# Patient Record
Sex: Male | Born: 1945 | Race: White | Hispanic: No | Smoking: Never smoker
Health system: Southern US, Community
[De-identification: ages and names within clinical notes are randomized; demographics above are authoritative.]

## PROBLEM LIST (undated history)

## (undated) ENCOUNTER — Emergency Department (HOSPITAL_COMMUNITY): Admission: EM | Payer: Medicare Other | Source: Home / Self Care

## (undated) DIAGNOSIS — H3320 Serous retinal detachment, unspecified eye: Secondary | ICD-10-CM

## (undated) DIAGNOSIS — D649 Anemia, unspecified: Secondary | ICD-10-CM

## (undated) DIAGNOSIS — G4730 Sleep apnea, unspecified: Secondary | ICD-10-CM

## (undated) DIAGNOSIS — K589 Irritable bowel syndrome without diarrhea: Secondary | ICD-10-CM

## (undated) DIAGNOSIS — G453 Amaurosis fugax: Secondary | ICD-10-CM

## (undated) DIAGNOSIS — N200 Calculus of kidney: Secondary | ICD-10-CM

## (undated) DIAGNOSIS — N400 Enlarged prostate without lower urinary tract symptoms: Secondary | ICD-10-CM

## (undated) DIAGNOSIS — H348122 Central retinal vein occlusion, left eye, stable: Secondary | ICD-10-CM

## (undated) DIAGNOSIS — E7800 Pure hypercholesterolemia, unspecified: Secondary | ICD-10-CM

## (undated) DIAGNOSIS — A048 Other specified bacterial intestinal infections: Secondary | ICD-10-CM

## (undated) DIAGNOSIS — G43909 Migraine, unspecified, not intractable, without status migrainosus: Secondary | ICD-10-CM

## (undated) DIAGNOSIS — H35342 Macular cyst, hole, or pseudohole, left eye: Secondary | ICD-10-CM

## (undated) DIAGNOSIS — I10 Essential (primary) hypertension: Secondary | ICD-10-CM

## (undated) HISTORY — DX: Other specified bacterial intestinal infections: A04.8

## (undated) HISTORY — DX: Serous retinal detachment, unspecified eye: H33.20

## (undated) HISTORY — DX: Benign prostatic hyperplasia without lower urinary tract symptoms: N40.0

## (undated) HISTORY — DX: Macular cyst, hole, or pseudohole, left eye: H35.342

## (undated) HISTORY — DX: Migraine, unspecified, not intractable, without status migrainosus: G43.909

## (undated) HISTORY — PX: LITHOTRIPSY: SUR834

## (undated) HISTORY — DX: Sleep apnea, unspecified: G47.30

## (undated) HISTORY — DX: Calculus of kidney: N20.0

## (undated) HISTORY — PX: RETINAL DETACHMENT SURGERY: SHX105

## (undated) HISTORY — DX: Central retinal vein occlusion, left eye, stable: H34.8122

## (undated) HISTORY — PX: HERNIA REPAIR: SHX51

## (undated) HISTORY — DX: Irritable bowel syndrome, unspecified: K58.9

## (undated) HISTORY — PX: KIDNEY STONE SURGERY: SHX686

## (undated) HISTORY — DX: Amaurosis fugax: G45.3

---

## 2003-08-23 DIAGNOSIS — H3320 Serous retinal detachment, unspecified eye: Secondary | ICD-10-CM

## 2003-08-23 DIAGNOSIS — H35342 Macular cyst, hole, or pseudohole, left eye: Secondary | ICD-10-CM

## 2003-08-23 HISTORY — DX: Serous retinal detachment, unspecified eye: H33.20

## 2003-08-23 HISTORY — DX: Macular cyst, hole, or pseudohole, left eye: H35.342

## 2011-06-18 ENCOUNTER — Emergency Department (HOSPITAL_BASED_OUTPATIENT_CLINIC_OR_DEPARTMENT_OTHER)
Admission: EM | Admit: 2011-06-18 | Discharge: 2011-06-19 | Disposition: A | Discharge status: Home / Self Care | Payer: BC Managed Care – PPO | Attending: Emergency Medicine | Admitting: Emergency Medicine

## 2011-06-18 ENCOUNTER — Encounter: Payer: Self-pay | Admitting: *Deleted

## 2011-06-18 DIAGNOSIS — E7800 Pure hypercholesterolemia, unspecified: Secondary | ICD-10-CM | POA: Insufficient documentation

## 2011-06-18 DIAGNOSIS — J40 Bronchitis, not specified as acute or chronic: Secondary | ICD-10-CM

## 2011-06-18 DIAGNOSIS — R0602 Shortness of breath: Secondary | ICD-10-CM | POA: Insufficient documentation

## 2011-06-18 DIAGNOSIS — J069 Acute upper respiratory infection, unspecified: Secondary | ICD-10-CM | POA: Insufficient documentation

## 2011-06-18 HISTORY — DX: Benign prostatic hyperplasia without lower urinary tract symptoms: N40.0

## 2011-06-18 HISTORY — DX: Pure hypercholesterolemia, unspecified: E78.00

## 2011-06-18 HISTORY — DX: Calculus of kidney: N20.0

## 2011-06-18 NOTE — ED Notes (Signed)
Pt reports cold since Tuesday- had hiccups today- states he "couldn't breathe" when trying to lie flat- started zpack tonight

## 2011-06-19 ENCOUNTER — Other Ambulatory Visit: Payer: Self-pay

## 2011-06-19 ENCOUNTER — Emergency Department (INDEPENDENT_AMBULATORY_CARE_PROVIDER_SITE_OTHER): Payer: BC Managed Care – PPO

## 2011-06-19 DIAGNOSIS — R0602 Shortness of breath: Secondary | ICD-10-CM

## 2011-06-19 DIAGNOSIS — R0989 Other specified symptoms and signs involving the circulatory and respiratory systems: Secondary | ICD-10-CM

## 2011-06-19 LAB — RAPID STREP SCREEN (MED CTR MEBANE ONLY): Streptococcus, Group A Screen (Direct): NEGATIVE

## 2011-06-19 MED ORDER — ALBUTEROL SULFATE HFA 108 (90 BASE) MCG/ACT IN AERS
2.0000 | INHALATION_SPRAY | Freq: Four times a day (QID) | RESPIRATORY_TRACT | Status: DC
  Administered 2011-06-19 (×2): 2 via RESPIRATORY_TRACT
  Filled 2011-06-19: qty 6.7

## 2011-06-19 NOTE — ED Provider Notes (Signed)
History     CSN: [id number] Arrival date & time: 06/18/2011 11:43 PM   First MD Initiated Contact with Patient 06/19/11 0115      Chief Complaint  Patient presents with  . URI  . Shortness of Breath    (Consider location/radiation/quality/duration/timing/severity/associated sxs/prior treatment) Patient is a 65 y.o. male presenting with cough.  Cough This is a new problem. The current episode started more than 2 days ago. The problem occurs every few minutes. The cough is productive of sputum. There has been no fever. Associated symptoms include rhinorrhea, sore throat and shortness of breath. Pertinent negatives include no chest pain, no chills, no sweats, no ear congestion, no headaches and no wheezing. Associated symptoms comments: Pt got SOB on laying down tonight which is why he came in, feels better now. He is not a smoker. His past medical history does not include COPD or asthma.    Past Medical History  Diagnosis Date  . Kidney stones   . High cholesterol   . BPH (benign prostatic hyperplasia)     Past Surgical History  Procedure Date  . Retinal detachment surgery   . Hernia repair   . Kidney stone surgery   . Lithotripsy     History reviewed. No pertinent family history.  History  Substance Use Topics  . Smoking status: Never Smoker   . Smokeless tobacco: Not on file  . Alcohol Use: Yes     rare      Review of Systems  Constitutional: Negative for fever, chills, diaphoresis and fatigue.  HENT: Positive for congestion, sore throat and rhinorrhea. Negative for sneezing.   Eyes: Negative.   Respiratory: Positive for cough and shortness of breath. Negative for chest tightness and wheezing.   Cardiovascular: Negative for chest pain and leg swelling.  Gastrointestinal: Negative for nausea, vomiting, abdominal pain, diarrhea and blood in stool.  Genitourinary: Negative for frequency, hematuria, flank pain and difficulty urinating.  Musculoskeletal: Negative  for back pain and arthralgias.  Skin: Negative for rash.  Neurological: Negative for dizziness, speech difficulty, weakness, numbness and headaches.    Allergies  Codeine  Home Medications   Current Outpatient Rx  Name Route Sig Dispense Refill  . AZITHROMYCIN 500 MG PO TABS Oral Take 500 mg by mouth daily.      Marland Kitchen DEXTROMETHORPHAN-GUAIFENESIN 30-600 MG PO TB12 Oral Take 1 tablet by mouth every 12 (twelve) hours.      Marland Kitchen CARDURA PO Oral Take by mouth.      . TOVIAZ PO Oral Take by mouth.      . FOLIC ACID PO Oral Take by mouth daily.      Marland Kitchen LORATADINE-PSEUDOEPHEDRINE 10-240 MG PO TB24 Oral Take 1 tablet by mouth daily.      Marland Kitchen ROSUVASTATIN CALCIUM 40 MG PO TABS Oral Take 40 mg by mouth daily.        BP 144/88  Pulse 92  Temp(Src) 98 F (36.7 C) (Oral)  Resp 19  Ht 5\' 9"  (1.753 m)  Wt 179 lb (81.194 kg)  BMI 26.43 kg/m2  SpO2 96%  Physical Exam  Constitutional: He is oriented to person, place, and time. He appears well-developed and well-nourished.  HENT:  Head: Normocephalic and atraumatic.  Eyes: Pupils are equal, round, and reactive to light.  Neck: Normal range of motion. Neck supple.  Cardiovascular: Normal rate, regular rhythm and normal heart sounds.   Pulmonary/Chest: Effort normal. No respiratory distress. He has no wheezes. He has rhonchi. He has no rales.  He exhibits no tenderness.  Abdominal: Soft. Bowel sounds are normal. There is no tenderness. There is no rebound and no guarding.  Musculoskeletal: Normal range of motion. He exhibits no edema and no tenderness.  Lymphadenopathy:    He has no cervical adenopathy.  Neurological: He is alert and oriented to person, place, and time.  Skin: Skin is warm and dry. No rash noted.  Psychiatric: He has a normal mood and affect.    ED Course  Procedures (including critical care time)  Results for orders placed during the hospital encounter of 06/18/11  RAPID STREP SCREEN      Component Value Range   Streptococcus,  Group A Screen (Direct) NEGATIVE  NEGATIVE    Dg Chest 2 View  06/19/2011  *RADIOLOGY REPORT*  Clinical Data: Congestion and shortness of breath  CHEST - 2 VIEW  Comparison: None.  Findings: Slightly shallow inspiration with elevation of the anterior right hemidiaphragm.  Normal heart size and pulmonary vascularity.  Suggestion of mild fibrosis or atelectasis in the lung bases.  No focal airspace consolidation.  No blunting of costophrenic angles.  No pneumothorax.  Calcified and tortuous aorta.  Degenerative changes in the thoracic spine.  IMPRESSION: Shallow inspiration with mild atelectasis or fibrosis in the lung bases.  No focal airspace consolidation.  Original Report Authenticated By: Marlon Pel, M.D.    Date: 06/19/2011  Rate: 98  Rhythm: normal sinus rhythm  QRS Axis: normal  Intervals: normal  ST/T Wave abnormalities: normal  Conduction Disutrbances:incomplete RBBB  Narrative Interpretation:   Old EKG Reviewed: none available     1. Bronchitis       MDM  No evidence of pneumonia/CHF/strept pharyngitis.  Likely bronchitis, no wheezing, good sats. Pt started z-pack tonight, also has cough med he is taking prescribed by his PMD.  EKG okay.  Pt feeling better.  Was feeling wheezy earlier.  Will try MDI        Rolan Bucco, MD 06/19/11 [phone]

## 2012-11-12 ENCOUNTER — Other Ambulatory Visit: Payer: Self-pay | Admitting: *Deleted

## 2012-11-12 NOTE — Telephone Encounter (Signed)
Call from patient in reference to the Amitriptyline 25 mg, he would like to know could he take the generic equivalent to this medication? Is it better or could he try something else?

## 2012-11-12 NOTE — Telephone Encounter (Signed)
Called pt. Told him its ok to take generic. He was also told to try "sleepy time" herbal tea if he was having sleeping trouble.  PG

## 2013-01-15 ENCOUNTER — Telehealth: Payer: Self-pay | Admitting: *Deleted

## 2013-01-15 ENCOUNTER — Encounter: Payer: Self-pay | Admitting: *Deleted

## 2013-01-15 NOTE — Telephone Encounter (Signed)
Opened in Error.

## 2013-01-16 ENCOUNTER — Ambulatory Visit (INDEPENDENT_AMBULATORY_CARE_PROVIDER_SITE_OTHER): Payer: BC Managed Care – PPO | Admitting: Family Medicine

## 2013-01-16 ENCOUNTER — Encounter: Payer: Self-pay | Admitting: Family Medicine

## 2013-01-16 VITALS — BP 108/71 | HR 93 | Wt 184.0 lb

## 2013-01-16 DIAGNOSIS — K219 Gastro-esophageal reflux disease without esophagitis: Secondary | ICD-10-CM

## 2013-01-16 DIAGNOSIS — Z5181 Encounter for therapeutic drug level monitoring: Secondary | ICD-10-CM

## 2013-01-16 LAB — COMPLETE METABOLIC PANEL WITH GFR
ALT: 28 U/L (ref 0–53)
AST: 28 U/L (ref 0–37)
Albumin: 4.6 g/dL (ref 3.5–5.2)
Alkaline Phosphatase: 59 U/L (ref 39–117)
BUN: 21 mg/dL (ref 6–23)
CO2: 26 mEq/L (ref 19–32)
Calcium: 9.7 mg/dL (ref 8.4–10.5)
Chloride: 102 mEq/L (ref 96–112)
Creat: 1.31 mg/dL (ref 0.50–1.35)
GFR, Est African American: 64 mL/min
GFR, Est Non African American: 55 mL/min — ABNORMAL LOW
Glucose, Bld: 79 mg/dL (ref 70–99)
Potassium: 4.5 mEq/L (ref 3.5–5.3)
Sodium: 135 mEq/L (ref 135–145)
Total Bilirubin: 1.2 mg/dL (ref 0.3–1.2)
Total Protein: 7.2 g/dL (ref 6.0–8.3)

## 2013-01-17 LAB — H. PYLORI ANTIBODY, IGG: H Pylori IgG: 4.67 {ISR} — ABNORMAL HIGH

## 2013-01-24 NOTE — Progress Notes (Signed)
  Subjective:    Patient ID: Jon Bush, male    DOB: Jul 27, 1946, 67 y.o.   MRN: [id number]  HPI  Darien is here today to discuss his medications.  He is on several and wants to know if he can stop some of them including his Protonix.  He has never had an EGD and he does not know if he has ever been tested for H. Pylori.     Review of Systems  Constitutional: Negative for fatigue and unexpected weight change.  HENT: Positive for sore throat and trouble swallowing.   Gastrointestinal: Negative for nausea, vomiting, diarrhea, constipation and abdominal distention.    Past Medical History  Diagnosis Date  . Kidney stones   . High cholesterol   . BPH (benign prostatic hyperplasia)   . Sleep apnea   . Enlarged prostate   . Migraine headache   . IBS (irritable bowel syndrome)   . Kidney stone   . Central retinal vein occlusion of left eye     (Left) Dr. Guy [facility]); Dr. Memory Argue [facility])   . Amaurosis fugax of left eye   . Retinal detachment 2005  . Macular hole of left eye 2005   Family History  Problem Relation Age of Onset  . Heart disease Mother   . Hypertension Mother   . Hyperlipidemia Mother   . Heart disease Father   . Alzheimer's disease Father    History   Social History Narrative   Marital Status: Divorced Environmental education officer)   Children:  Sons Gabriel Rung, Casimiro Needle); Daughter Maralyn Sago)   Pets:  None    Living Situation: Lives alone.     Occupation: Professor Passenger transport manager)    Education: Engineer, maintenance (IT)   Tobacco Use/Exposure:  None    Alcohol Use:  Rarely   Drug Use:  None   Diet:  Regular   Exercise:  Occasionally   Hobbies: Collects Pop Bottles                 Objective:   Physical Exam  Constitutional: He appears well-nourished.  HENT:  Head: Normocephalic.  Nose: Nose normal.  Mouth/Throat: Oropharynx is clear and moist.  Eyes: Conjunctivae are normal. No scleral icterus.  Neck: Neck supple. No thyromegaly  present.  Cardiovascular: Normal rate, regular rhythm and normal heart sounds.   Pulmonary/Chest: Effort normal and breath sounds normal.  Abdominal: Soft. He exhibits no mass. There is no tenderness.  Musculoskeletal: Normal range of motion.  Lymphadenopathy:    He has no cervical adenopathy.  Neurological: He is alert.  Skin: Skin is warm and dry. No rash noted.  Psychiatric: He has a normal mood and affect. His behavior is normal. Judgment and thought content normal.          Assessment & Plan:

## 2013-02-09 ENCOUNTER — Encounter: Payer: Self-pay | Admitting: Family Medicine

## 2013-02-09 DIAGNOSIS — Z5181 Encounter for therapeutic drug level monitoring: Secondary | ICD-10-CM | POA: Insufficient documentation

## 2013-02-09 DIAGNOSIS — K219 Gastro-esophageal reflux disease without esophagitis: Secondary | ICD-10-CM | POA: Insufficient documentation

## 2013-02-09 NOTE — Assessment & Plan Note (Signed)
Checking him for H. Pylori.  He should also have an EGD.  He is to check with his gastroenterologist to see when he needs his next colonoscopy.

## 2013-02-09 NOTE — Assessment & Plan Note (Signed)
Checking a CMET.   

## 2013-02-12 ENCOUNTER — Telehealth: Payer: Self-pay | Admitting: Family Medicine

## 2013-02-12 NOTE — Telephone Encounter (Signed)
Patient is schedule for f/u on h pylori 02/14/13 at 8am.

## 2013-02-12 NOTE — Telephone Encounter (Signed)
Message copied by Christiane Ha on Tue Feb 12, 2013  5:22 PM ------      Message from: Birdena Jubilee      Created: Sat Feb 09, 2013  6:27 PM       Jon Bush tested positive for H. Pylori so he needs to be brought in to discuss his result and what we need to do for follow up.   ------

## 2013-02-14 ENCOUNTER — Ambulatory Visit (INDEPENDENT_AMBULATORY_CARE_PROVIDER_SITE_OTHER): Payer: Medicare Other | Admitting: Family Medicine

## 2013-02-14 ENCOUNTER — Encounter: Payer: Self-pay | Admitting: Family Medicine

## 2013-02-14 VITALS — BP 128/87 | HR 98 | Wt 186.0 lb

## 2013-02-14 DIAGNOSIS — K589 Irritable bowel syndrome without diarrhea: Secondary | ICD-10-CM

## 2013-02-14 DIAGNOSIS — A048 Other specified bacterial intestinal infections: Secondary | ICD-10-CM

## 2013-02-14 DIAGNOSIS — K219 Gastro-esophageal reflux disease without esophagitis: Secondary | ICD-10-CM

## 2013-02-14 HISTORY — DX: Other specified bacterial intestinal infections: A04.8

## 2013-02-14 MED ORDER — AMOXICILLIN 500 MG PO CAPS: 1000.0000 mg | ORAL_CAPSULE | Freq: Two times a day (BID) | ORAL | Status: AC

## 2013-02-14 MED ORDER — CLARITHROMYCIN 500 MG PO TABS: 500.0000 mg | ORAL_TABLET | Freq: Two times a day (BID) | ORAL | Status: AC

## 2013-02-14 NOTE — Assessment & Plan Note (Signed)
Hopefully, these symptoms will improve after treating the H. Pylori.

## 2013-02-14 NOTE — Patient Instructions (Addendum)
1)  H. Pylori - You tested positive for this bacteria in the stomach.  We are going to treat you for 14 days with Amoxicillin 1000 mg twice a day and clarithromycin 500 mg twice a day.  When you go for your visit with the gastroenterologist, let them know you tested positive for this and see how they think is the best way and when you should be tested for a cure.  Take your test result with you to the GI appointment.      Helicobacter Pylori Disease Often patients with stomach or duodenal ulcers not caused by irritants, are infected with a germ. The germ is called helicobacter pylori (H. pylori). This bacterium lives on the surface of stomach and small bowel. It can cause redness, soreness and ulcers. Ulcers are a hole in the lining of your stomach or small bowel. Blood and special breath tests can detect if you are infected with H. pylori. Tests can be done on samples taken from the stomach if you have endoscopy. After treatment you may have tests to prove you are cured. These can be done about a month after you finish the treatment or as your caregiver suggests. Most infections can be cured with a combination of antibiotics. Antibiotics are medications which kill germs such as H. pylori. Anti-ulcer medicines which block stomach acid secretion may also be used. Treatment will be continued for the time your caregiver suggests. Call your caregiver if you need more information about H. pylori. Call also if your symptoms get worse during or after treatment. You will not need a special diet. Avoid:  Smoking.  Aspirin.  Ibuprofen.  Other anti-inflammatory drugs. Alcohol and spicy foods may also make your symptoms worse. The best advice is to avoid anything you find upsetting to your stomach. SEEK IMMEDIATE MEDICAL CARE IF:  You develop sharp, sudden, lasting stomach pain.  You have bloody vomit or vomit that looks like coffee grounds.  You have bloody or black stools.  You develop a lightheaded  feeling, fainting, or become weak and sweaty. Document Released: 08/08/2005 Document Revised: 10/31/2011 Document Reviewed: 01/24/2007 [facility] Patient Information 2013 [place], [place].

## 2013-02-14 NOTE — Assessment & Plan Note (Signed)
Jon Bush will take the medications as directed for 2 weeks.  He is to discuss these findings with his gastroenterologist to see when and how he should be tested for a cure.

## 2013-02-14 NOTE — Assessment & Plan Note (Signed)
He has struggled with IBS (diarrhea prominent) for years.  He is to take a probiotic like Align or Restora during the next 2 weeks that he is on the antibiotics.  He was also given samples of Welchol to take to help prevent worsening of his diarrhea.

## 2013-02-14 NOTE — Progress Notes (Signed)
  Subjective:    Patient ID: Jon Bush, male    DOB: 12-03-1945, 67 y.o.   MRN: [id number]  HPI  Jon Bush is here today to go over his most recent lab results.  Jon Bush has done well since his last office visit. Jon Bush has an upcoming appointment with [facility] for an EGD and colonoscopy.     Review of Systems  Gastrointestinal: Negative for abdominal pain.  Musculoskeletal: Negative for myalgias.  Neurological: Negative for light-headedness and headaches.  Psychiatric/Behavioral: The patient is not nervous/anxious.     Past Medical History  Diagnosis Date  . Kidney stones   . High cholesterol   . BPH (benign prostatic hyperplasia)   . Sleep apnea   . Enlarged prostate   . Migraine headache   . IBS (irritable bowel syndrome)   . Kidney stone   . Central retinal vein occlusion of left eye     (Left) Dr. Guy Begin [facility]); Dr. Memory Argue [facility])   . Amaurosis fugax of left eye   . Retinal detachment 2005  . Macular hole of left eye 2005    Family History  Problem Relation Age of Onset  . Heart disease Mother   . Hypertension Mother   . Hyperlipidemia Mother   . Heart disease Father   . Alzheimer's disease Father     History   Social History Narrative   Marital Status: Divorced Environmental education officer)   Children:  Sons Gabriel Rung, Casimiro Needle); Daughter Maralyn Sago)   Pets:  None    Living Situation: Lives alone.     Occupation: Professor Passenger transport manager)    Education: Engineer, maintenance (IT)   Tobacco Use/Exposure:  None    Alcohol Use:  Rarely   Drug Use:  None   Diet:  Regular   Exercise:  Occasionally   Hobbies: Collects Pop Bottles                 Objective:   Physical Exam  Constitutional: Jon Bush appears well-nourished.  Eyes: No scleral icterus.  Neck: No thyromegaly present.  Cardiovascular: Normal rate, regular rhythm and normal heart sounds.   Pulmonary/Chest: Effort normal and breath sounds normal.  Abdominal: Soft. Bowel sounds are normal.  Jon Bush exhibits no distension and no mass. There is no tenderness.  Skin: Skin is warm and dry.  Psychiatric: Jon Bush has a normal mood and affect. His behavior is normal. Judgment and thought content normal.       Assessment & Plan:

## 2013-03-22 ENCOUNTER — Encounter: Payer: Self-pay | Admitting: Family Medicine

## 2013-03-22 ENCOUNTER — Encounter: Payer: Self-pay | Admitting: *Deleted

## 2013-03-22 ENCOUNTER — Ambulatory Visit (INDEPENDENT_AMBULATORY_CARE_PROVIDER_SITE_OTHER): Payer: Medicare Other | Admitting: Family Medicine

## 2013-03-22 VITALS — BP 130/80 | HR 98 | Resp 16 | Ht 68.0 in | Wt 183.0 lb

## 2013-03-22 DIAGNOSIS — G4700 Insomnia, unspecified: Secondary | ICD-10-CM

## 2013-03-22 DIAGNOSIS — N400 Enlarged prostate without lower urinary tract symptoms: Secondary | ICD-10-CM

## 2013-03-22 DIAGNOSIS — IMO0001 Reserved for inherently not codable concepts without codable children: Secondary | ICD-10-CM

## 2013-03-22 DIAGNOSIS — K219 Gastro-esophageal reflux disease without esophagitis: Secondary | ICD-10-CM

## 2013-03-22 DIAGNOSIS — R030 Elevated blood-pressure reading, without diagnosis of hypertension: Secondary | ICD-10-CM

## 2013-03-22 MED ORDER — DOXAZOSIN MESYLATE 8 MG PO TABS: 8.0000 mg | ORAL_TABLET | Freq: Every day | ORAL | Status: DC

## 2013-03-22 NOTE — Patient Instructions (Addendum)
1)  Insomnia -   Limit your caffeine and increase your exercise. You can also try the herbal teas at night (Sleepy Time /Sweet Dreams)    Insomnia Insomnia is frequent trouble falling and/or staying asleep. Insomnia can be a long term problem or a short term problem. Both are common. Insomnia can be a short term problem when the wakefulness is related to a certain stress or worry. Long term insomnia is often related to ongoing stress during waking hours and/or poor sleeping habits. Overtime, sleep deprivation itself can make the problem worse. Every little thing feels more severe because you are overtired and your ability to cope is decreased. CAUSES   Stress, anxiety, and depression.  Poor sleeping habits.  Distractions such as TV in the bedroom.  Naps close to bedtime.  Engaging in emotionally charged conversations before bed.  Technical reading before sleep.  Alcohol and other sedatives. They may make the problem worse. They can hurt normal sleep patterns and normal dream activity.  Stimulants such as caffeine for several hours prior to bedtime.  Pain syndromes and shortness of breath can cause insomnia.  Exercise late at night.  Changing time zones may cause sleeping problems (jet lag). It is sometimes helpful to have someone observe your sleeping patterns. They should look for periods of not breathing during the night (sleep apnea). They should also look to see how long those periods last. If you live alone or observers are uncertain, you can also be observed at a sleep clinic where your sleep patterns will be professionally monitored. Sleep apnea requires a checkup and treatment. Give your caregivers your medical history. Give your caregivers observations your family has made about your sleep.  SYMPTOMS   Not feeling rested in the morning.  Anxiety and restlessness at bedtime.  Difficulty falling and staying asleep. TREATMENT   Your caregiver may prescribe treatment for an  underlying medical disorders. Your caregiver can give advice or help if you are using alcohol or other drugs for self-medication. Treatment of underlying problems will usually eliminate insomnia problems.  Medications can be prescribed for short time use. They are generally not recommended for lengthy use.  Over-the-counter sleep medicines are not recommended for lengthy use. They can be habit forming.  You can promote easier sleeping by making lifestyle changes such as:  Using relaxation techniques that help with breathing and reduce muscle tension.  Exercising earlier in the day.  Changing your diet and the time of your last meal. No night time snacks.  Establish a regular time to go to bed.  Counseling can help with stressful problems and worry.  Soothing music and white noise may be helpful if there are background noises you cannot remove.  Stop tedious detailed work at least one hour before bedtime. HOME CARE INSTRUCTIONS   Keep a diary. Inform your caregiver about your progress. This includes any medication side effects. See your caregiver regularly. Take note of:  Times when you are asleep.  Times when you are awake during the night.  The quality of your sleep.  How you feel the next day. This information will help your caregiver care for you.  Get out of bed if you are still awake after 15 minutes. Read or do some quiet activity. Keep the lights down. Wait until you feel sleepy and go back to bed.  Keep regular sleeping and waking hours. Avoid naps.  Exercise regularly.  Avoid distractions at bedtime. Distractions include watching television or engaging in any intense or  detailed activity like attempting to balance the household checkbook.  Develop a bedtime ritual. Keep a familiar routine of bathing, brushing your teeth, climbing into bed at the same time each night, listening to soothing music. Routines increase the success of falling to sleep faster.  Use  relaxation techniques. This can be using breathing and muscle tension release routines. It can also include visualizing peaceful scenes. You can also help control troubling or intruding thoughts by keeping your mind occupied with boring or repetitive thoughts like the old concept of counting sheep. You can make it more creative like imagining planting one beautiful flower after another in your backyard garden.  During your day, work to eliminate stress. When this is not possible use some of the previous suggestions to help reduce the anxiety that accompanies stressful situations. MAKE SURE YOU:   Understand these instructions.  Will watch your condition.  Will get help right away if you are not doing well or get worse. Document Released: 08/05/2000 Document Revised: 10/31/2011 Document Reviewed: 09/05/2007 [facility] Patient Information 2014 [place], [place].

## 2013-03-22 NOTE — Progress Notes (Signed)
Subjective:    Patient ID: Jon Bush, male    DOB: Nov 12, 1945, 67 y.o.   MRN: [id number]  HPI  Jon Bush is here today to discuss the conditions listed below:   1)  Insomnia:  He has been taking Amytriptyline along with Melatonin 3 mg but continues to have trouble with his sleeping.      2)  Hypertension:  Dr Conley Rolls has expressed some concern about his BP.  It has been higher at several of his office visits with Dr Conley Rolls.   3)  Acid Reflux:  He is not taking his pantoprazole due to his H.  Pylori infection treatment.  Dr Conley Rolls suggested that he should be off of this medication until his infection clears.  His stool test was on 03/19/13 but his results are not back yet.    4)  BPH:  He wants to have his Doxasozin refilled at [facility].    Review of Systems  Constitutional: Positive for fatigue. Negative for activity change, appetite change and unexpected weight change.  HENT: Negative.   Eyes: Negative.   Respiratory: Negative for cough, chest tightness and shortness of breath.   Cardiovascular: Negative for chest pain, palpitations and leg swelling.  Gastrointestinal: Negative for abdominal pain, diarrhea, constipation and blood in stool.  Endocrine: Negative for polydipsia, polyphagia and polyuria.  Genitourinary: Negative for difficulty urinating.  Musculoskeletal: Negative for myalgias, joint swelling and arthralgias.  Skin: Negative for rash.  Neurological: Negative for dizziness, numbness and headaches.  Hematological: Negative.   Psychiatric/Behavioral: Positive for sleep disturbance.    Past Medical History  Diagnosis Date  . Kidney stones   . High cholesterol   . BPH (benign prostatic hyperplasia)   . Sleep apnea   . Enlarged prostate   . Migraine headache   . IBS (irritable bowel syndrome)   . Kidney stone   . Central retinal vein occlusion of left eye     (Left) Dr. Guy Begin [facility]); Dr. Memory [facility])   . Amaurosis fugax of left eye   . Retinal  detachment 2005  . Macular hole of left eye 2005  . H. pylori infection 02/14/2013    Family History  Problem Relation Age of Onset  . Heart disease Mother   . Hypertension Mother   . Hyperlipidemia Mother   . Heart disease Father   . Alzheimer's disease Father     History   Social History Narrative   Marital Status: Divorced Environmental education officer)   Children:  Sons Jon Bush, Jon Bush); Daughter Jon Bush)   Pets:  None    Living Situation: Lives alone.     Occupation: Professor Passenger transport manager)    Education: Engineer, maintenance (IT)   Tobacco Use/Exposure:  None    Alcohol Use:  Rarely   Drug Use:  None   Diet:  Regular   Exercise:  Occasionally   Hobbies: Collects Pop Bottles                  Objective:   Physical Exam  Constitutional: He is oriented to person, place, and time. He appears well-nourished. No distress.  HENT:  Head: Normocephalic.  Eyes: No scleral icterus.  Neck: Neck supple. No thyromegaly present.  Cardiovascular: Normal rate, regular rhythm and normal heart sounds.   Pulmonary/Chest: Effort normal and breath sounds normal.  Musculoskeletal: Normal range of motion. He exhibits no edema.  Neurological: He is alert and oriented to person, place, and time.  Skin: Skin is warm and dry. No rash  noted.  Psychiatric: He has a normal mood and affect. His behavior is normal. Judgment and thought content normal.          Assessment & Plan:

## 2013-05-11 DIAGNOSIS — G4700 Insomnia, unspecified: Secondary | ICD-10-CM | POA: Insufficient documentation

## 2013-05-11 DIAGNOSIS — N400 Enlarged prostate without lower urinary tract symptoms: Secondary | ICD-10-CM | POA: Insufficient documentation

## 2013-05-11 DIAGNOSIS — IMO0001 Reserved for inherently not codable concepts without codable children: Secondary | ICD-10-CM | POA: Insufficient documentation

## 2013-05-11 DIAGNOSIS — R338 Other retention of urine: Secondary | ICD-10-CM | POA: Insufficient documentation

## 2013-05-11 NOTE — Assessment & Plan Note (Signed)
Sent his prescription for Cardura to [facility].

## 2013-05-11 NOTE — Assessment & Plan Note (Signed)
His BP is fine at his visit today and totally within normal range of the new JNC 8 guidelines.  He already feels fatigued so he does not need another medication.  We'll watch his pressure.

## 2013-05-11 NOTE — Assessment & Plan Note (Signed)
We discussed some things he can try to improve his sleep.

## 2013-05-11 NOTE — Assessment & Plan Note (Signed)
I sent Ransom to Dr. Conley Rolls thinking he would do an EGD and he did not.  I called Dr. Conley Rolls to find out why since Quay has a history of GERD and was positive for H. Pylori.  He will readdress this with Davinder.

## 2013-06-27 ENCOUNTER — Encounter: Payer: Self-pay | Admitting: Family Medicine

## 2013-06-27 ENCOUNTER — Ambulatory Visit (INDEPENDENT_AMBULATORY_CARE_PROVIDER_SITE_OTHER): Payer: Medicare Other | Admitting: Family Medicine

## 2013-06-27 ENCOUNTER — Other Ambulatory Visit: Payer: Self-pay

## 2013-06-27 VITALS — BP 119/79 | HR 71 | Resp 16 | Wt 183.0 lb

## 2013-06-27 DIAGNOSIS — G4700 Insomnia, unspecified: Secondary | ICD-10-CM

## 2013-06-27 DIAGNOSIS — E785 Hyperlipidemia, unspecified: Secondary | ICD-10-CM

## 2013-06-27 MED ORDER — PRAVASTATIN SODIUM 80 MG PO TABS: 80.0000 mg | ORAL_TABLET | Freq: Every day | ORAL | Status: DC

## 2013-06-27 NOTE — Progress Notes (Signed)
Subjective:    Patient ID: Jon Bush, male    DOB: 04-19-1946, 67 y.o.   MRN: [id number]  HPI  Zeplin is here today to discuss the conditions listed below:   1)  Hyperlipidemia:  He has done well on Crestor but needs to change because his insurance no longer cover this medication.     2)  Insomnia:  He is still struggling with this problem.  He has been taking amitriptyline (25 mg) along with 6 mg of Melatonin.  He wonders if he can increase either medication to help him sleep better.        Review of Systems  Constitutional: Negative.   HENT: Negative.   Eyes: Negative.   Respiratory: Negative.   Cardiovascular: Negative for chest pain, palpitations and leg swelling.  Gastrointestinal: Negative.   Endocrine: Negative.   Genitourinary: Negative.   Musculoskeletal: Negative for myalgias.  Skin: Negative.   Allergic/Immunologic: Negative.   Neurological: Negative.   Hematological: Negative.   Psychiatric/Behavioral: Positive for sleep disturbance.     Past Medical History  Diagnosis Date  . Kidney stones   . High cholesterol   . BPH (benign prostatic hyperplasia)   . Sleep apnea   . Enlarged prostate   . Migraine headache   . IBS (irritable bowel syndrome)   . Kidney stone   . Central retinal vein occlusion of left eye     (Left) Dr. Guy Begin [facility]); Dr. Memory Argue [facility])   . Amaurosis fugax of left eye   . Retinal detachment 2005  . Macular hole of left eye 2005  . H. pylori infection 02/14/2013     Past Surgical History  Procedure Laterality Date  . Retinal detachment surgery    . Hernia repair    . Kidney stone surgery    . Lithotripsy       History   Social History Narrative   Marital Status: Divorced Environmental education officer)   Children:  Sons Gabriel Rung, Casimiro Needle); Daughter Maralyn Sago)   Pets:  None    Living Situation: Lives alone.     Occupation: Professor Passenger transport manager)    Education: Engineer, maintenance (IT)   Tobacco  Use/Exposure:  None    Alcohol Use:  Rarely   Drug Use:  None   Diet:  Regular   Exercise:  Occasionally   Hobbies: Printmaker               Family History  Problem Relation Age of Onset  . Heart disease Mother   . Hypertension Mother   . Hyperlipidemia Mother   . Heart attack Mother   . Heart disease Father   . Alzheimer's disease Father   . Diabetes Neg Hx      Current Outpatient Prescriptions on File Prior to Visit  Medication Sig Dispense Refill  . amitriptyline (ELAVIL) 25 MG tablet Take 25 mg by mouth at bedtime.      Marland Kitchen aspirin 81 MG tablet Take 81 mg by mouth daily.      . cabergoline (DOSTINEX) 0.5 MG tablet Take 0.25 mg by mouth 2 (two) times a week.      . diclofenac sodium (VOLTAREN) 1 % GEL Apply topically.      . doxazosin (CARDURA) 8 MG tablet Take 1 tablet (8 mg total) by mouth at bedtime.  180 tablet  1  . FOLIC ACID PO Take by mouth daily.        . hydrOXYzine (ATARAX/VISTARIL) 25 MG tablet Take 25 mg by  mouth every 4 (four) hours as needed for itching.      . mometasone (NASONEX) 50 MCG/ACT nasal spray Place 2 sprays into the nose daily.      . niacin (NIASPAN) 500 MG CR tablet Take 500 mg by mouth at bedtime.      . pantoprazole (PROTONIX) 40 MG tablet Take 40 mg by mouth daily.      . rosuvastatin (CRESTOR) 40 MG tablet Take 40 mg by mouth daily.         No current facility-administered medications on file prior to visit.     Allergies  Allergen Reactions  . Codeine      Immunization History  Administered Date(s) Administered  . Pneumococcal Conjugate-13 01/06/2012  . Td 08/22/2004  . Zoster 06/05/2007      Objective:   Physical Exam  Vitals reviewed. Constitutional: He is oriented to person, place, and time. He appears well-developed and well-nourished. No distress.  HENT:  Head: Normocephalic.  Eyes: No scleral icterus.  Neck: Neck supple. No thyromegaly present.  Cardiovascular: Normal rate, regular rhythm and normal  heart sounds.  Exam reveals no gallop and no friction rub.   No murmur heard. Pulmonary/Chest: Effort normal and breath sounds normal. No respiratory distress. He exhibits no tenderness.  Musculoskeletal: He exhibits no edema.  Neurological: He is alert and oriented to person, place, and time.  Skin: Skin is warm and dry. No rash noted.  Psychiatric: He has a normal mood and affect. His behavior is normal. Judgment and thought content normal.       Assessment & Plan:    Gevork was seen today for medication management.  Diagnoses and associated orders for this visit:  Other and unspecified hyperlipidemia - pravastatin (PRAVACHOL) 80 MG tablet; Take 1 tablet (80 mg total) by mouth at bedtime.  Insomnia  We discussed some things he can do to sleep better.

## 2013-07-12 ENCOUNTER — Ambulatory Visit (INDEPENDENT_AMBULATORY_CARE_PROVIDER_SITE_OTHER): Payer: Medicare Other | Admitting: Family Medicine

## 2013-07-12 ENCOUNTER — Encounter: Payer: Self-pay | Admitting: Family Medicine

## 2013-07-12 ENCOUNTER — Ambulatory Visit (HOSPITAL_BASED_OUTPATIENT_CLINIC_OR_DEPARTMENT_OTHER)
Admission: RE | Admit: 2013-07-12 | Discharge: 2013-07-12 | Disposition: A | Discharge status: Home / Self Care | Payer: Medicare Other | Source: Ambulatory Visit | Attending: Family Medicine | Admitting: Family Medicine

## 2013-07-12 VITALS — BP 144/87 | HR 81 | Resp 16 | Wt 184.0 lb

## 2013-07-12 DIAGNOSIS — M259 Joint disorder, unspecified: Secondary | ICD-10-CM | POA: Insufficient documentation

## 2013-07-12 DIAGNOSIS — M25559 Pain in unspecified hip: Secondary | ICD-10-CM

## 2013-07-12 DIAGNOSIS — M47817 Spondylosis without myelopathy or radiculopathy, lumbosacral region: Secondary | ICD-10-CM | POA: Insufficient documentation

## 2013-07-12 DIAGNOSIS — M545 Low back pain, unspecified: Secondary | ICD-10-CM | POA: Insufficient documentation

## 2013-07-12 DIAGNOSIS — M25551 Pain in right hip: Secondary | ICD-10-CM

## 2013-07-12 DIAGNOSIS — J309 Allergic rhinitis, unspecified: Secondary | ICD-10-CM

## 2013-07-12 DIAGNOSIS — M79609 Pain in unspecified limb: Secondary | ICD-10-CM | POA: Insufficient documentation

## 2013-07-12 MED ORDER — FLUTICASONE PROPIONATE 50 MCG/ACT NA SUSP: 2.0000 | Freq: Every day | NASAL | Status: DC

## 2013-07-12 NOTE — Progress Notes (Signed)
  Subjective:    Patient ID: Jon Bush, male    DOB: 01/22/1946, 67 y.o.   MRN: [id number]  Jon Bush is here today to discuss the discomfort and numbness he has had in his right thigh for the past several weeks.  He also wants to know if there is a nasal spray cheaper than Nasonex.    Leg Pain  The incident occurred more than 1 week ago. The pain is present in the right leg (outer side). The quality of the pain is described as burning. The pain is moderate. The pain has been intermittent since onset. Associated symptoms include numbness and tingling. The symptoms are aggravated by weight bearing. He has tried NSAIDs for the symptoms. The treatment provided no relief.    Review of Systems  Constitutional: Negative.   HENT: Negative.   Eyes: Negative.   Respiratory: Negative.   Cardiovascular: Negative.   Gastrointestinal: Negative.   Endocrine: Negative.   Genitourinary: Negative.   Musculoskeletal: Negative.   Allergic/Immunologic: Negative.   Neurological: Positive for tingling and numbness.  Hematological: Negative.   Psychiatric/Behavioral: Negative.     Past Medical History, Past Surgical History, Family History, Social History and Medications were reviewed and update at this visit.       Objective:   Physical Exam  Vitals reviewed. Constitutional: He is oriented to person, place, and time. He appears well-developed and well-nourished.  Cardiovascular: Normal rate and regular rhythm.   Pulmonary/Chest: Effort normal and breath sounds normal.  Musculoskeletal: He exhibits no edema. Tenderness: Mild discomfort   Right thigh looks the same as the left (no muscle wasting).  Normal strength   Neurological: He is alert and oriented to person, place, and time.  Skin: Skin is warm and dry. No rash noted.  Psychiatric: He has a normal mood and affect.      Assessment & Plan:

## 2013-07-14 ENCOUNTER — Other Ambulatory Visit: Payer: Self-pay | Admitting: Family Medicine

## 2013-07-14 ENCOUNTER — Encounter: Payer: Self-pay | Admitting: Family Medicine

## 2013-07-14 DIAGNOSIS — J309 Allergic rhinitis, unspecified: Secondary | ICD-10-CM | POA: Insufficient documentation

## 2013-07-14 DIAGNOSIS — M25559 Pain in unspecified hip: Secondary | ICD-10-CM | POA: Insufficient documentation

## 2013-07-14 DIAGNOSIS — R200 Anesthesia of skin: Secondary | ICD-10-CM

## 2013-07-14 NOTE — Assessment & Plan Note (Signed)
He was given a prescription for Flonase.  He'll compare the cost of this to OTC Nasacort.

## 2013-07-14 NOTE — Assessment & Plan Note (Signed)
Sent for an x-ray of lumbar spine and right thigh.  It showed moderate narrowing of L5-S1 and generative changes in right knee.  He may need a MRI to further evaluate this problem.  I will refer him to Dr. Pearletha Forge to see what he would recommend what other evaluation he needs.

## 2013-07-17 ENCOUNTER — Encounter: Payer: Self-pay | Admitting: Family Medicine

## 2013-07-17 ENCOUNTER — Ambulatory Visit (INDEPENDENT_AMBULATORY_CARE_PROVIDER_SITE_OTHER): Payer: Medicare Other | Admitting: Family Medicine

## 2013-07-17 VITALS — BP 113/77 | HR 99 | Ht 69.0 in | Wt 182.0 lb

## 2013-07-17 DIAGNOSIS — G609 Hereditary and idiopathic neuropathy, unspecified: Secondary | ICD-10-CM

## 2013-07-17 NOTE — Patient Instructions (Signed)
You have an irritated nerve in your thigh. I do not think this is from a herniated disc or other back pathology. Start with the voltaren gel three times a day regularly for next 2 weeks. If still having problems give me a call and would try prednisone dose pack. Another option would be neurontin (a nerve blocking medication) if the prednisone didn't work. Follow up as needed.

## 2013-07-22 ENCOUNTER — Encounter: Payer: Self-pay | Admitting: Family Medicine

## 2013-07-22 DIAGNOSIS — G609 Hereditary and idiopathic neuropathy, unspecified: Secondary | ICD-10-CM | POA: Insufficient documentation

## 2013-07-22 NOTE — Progress Notes (Signed)
Patient ID: Jon Bush, male   DOB: 1946-05-02, 67 y.o.   MRN: [id number]  PCP: Birdena Jubilee, MD  Subjective:   HPI: Patient is a 67 y.o. male here for right thigh pain.  Patient denies known injury. States for about 3-4 weeks he has had a burning sensation anterolateral right thigh. Tried advil, voltaren gel.  Voltaren gel has helped a lot. No back pain. No radiation beyond the anterolateral thigh. No bowel/bladder dysfunction. Associated numbness in same distribution. Doesn't have anything where he sits that pushes on hip, pelvis.  Does not wear tight pants.  Past Medical History  Diagnosis Date  . Kidney stones   . High cholesterol   . BPH (benign prostatic hyperplasia)   . Sleep apnea   . Enlarged prostate   . Migraine headache   . IBS (irritable bowel syndrome)   . Kidney stone   . Central retinal vein occlusion of left eye     (Left) Dr. Guy Begin [facility]); Dr. Memory Argue [facility])   . Amaurosis fugax of left eye   . Retinal detachment 2005  . Macular hole of left eye 2005  . H. pylori infection 02/14/2013    Current Outpatient Prescriptions on File Prior to Visit  Medication Sig Dispense Refill  . amitriptyline (ELAVIL) 25 MG tablet Take 25 mg by mouth at bedtime.      Marland Kitchen amoxicillin (AMOXIL) 500 MG capsule       . aspirin 81 MG tablet Take 81 mg by mouth daily.      . cabergoline (DOSTINEX) 0.5 MG tablet Take 0.25 mg by mouth 2 (two) times a week.      . CIALIS 5 MG tablet       . diclofenac sodium (VOLTAREN) 1 % GEL Apply topically.      . doxazosin (CARDURA) 8 MG tablet Take 1 tablet (8 mg total) by mouth at bedtime.  180 tablet  1  . fluticasone (FLONASE) 50 MCG/ACT nasal spray Place 2 sprays into both nostrils daily.  16 g  11  . FOLIC ACID PO Take by mouth daily.        . hydrOXYzine (ATARAX/VISTARIL) 25 MG tablet Take 25 mg by mouth every 4 (four) hours as needed for itching.      . mometasone (NASONEX) 50 MCG/ACT nasal spray Place 2 sprays  into the nose daily.      . niacin (NIASPAN) 500 MG CR tablet Take 500 mg by mouth at bedtime.      . pantoprazole (PROTONIX) 40 MG tablet Take 40 mg by mouth daily.      . potassium citrate (UROCIT-K) 10 MEQ (1080 MG) SR tablet Take 10 mEq by mouth 3 (three) times daily with meals.      . pravastatin (PRAVACHOL) 80 MG tablet Take 1 tablet (80 mg total) by mouth at bedtime.  90 tablet  3  . rosuvastatin (CRESTOR) 40 MG tablet Take 40 mg by mouth daily.         No current facility-administered medications on file prior to visit.    Past Surgical History  Procedure Laterality Date  . Retinal detachment surgery    . Hernia repair    . Kidney stone surgery    . Lithotripsy      Allergies  Allergen Reactions  . Codeine     History   Social History  . Marital Status: Divorced    Spouse Name: N/A    Number of Children: 3  . Years of  Education: N/A   Occupational History  . PROFESSOR Other    [employer]   Social History Main Topics  . Smoking status: Never Smoker   . Smokeless tobacco: Never Used  . Alcohol Use: Yes     Comment: Rarely  . Drug Use: No  . Sexual Activity: Not Currently    Partners: Female   Other Topics Concern  . Not on file   Social History Narrative   Marital Status: Divorced Environmental education officer)   Children:  Sons Gabriel Rung, Casimiro Needle); Daughter Maralyn Sago)   Pets:  None    Living Situation: Lives alone.     Occupation: Professor Passenger transport manager)    Education: Engineer, maintenance (IT)   Tobacco Use/Exposure:  None    Alcohol Use:  Rarely   Drug Use:  None   Diet:  Regular   Exercise:  Occasionally   Hobbies: Printmaker              Family History  Problem Relation Age of Onset  . Heart disease Mother   . Hypertension Mother   . Hyperlipidemia Mother   . Heart attack Mother   . Heart disease Father   . Alzheimer's disease Father   . Diabetes Neg Hx     BP 113/77  Pulse 99  Ht 5\' 9"  (1.753 m)  Wt 182 lb (82.555  kg)  BMI 26.86 kg/m2  Review of Systems: See HPI above.    Objective:  Physical Exam:  Gen: NAD  Right hip, back: No gross deformity, scoliosis. No paraspinal, hip, right leg TTP .  No midline or bony TTP. FROM back and hip without pain. Strength LEs 5/5 all muscle groups.   2+ MSRs in patellar and achilles tendons, equal bilaterally. Negative SLRs. Sensation diminished mid thigh anterior and laterally.  No other changes. Negative logroll bilateral hips Negative fabers and piriformis stretches.    Assessment & Plan:  1. Peripheral neuropathy - most consistent with meralgia paresthetica which is self limited.  Voltaren gel has helped him - advised he can continue to use this.  Other options include prednisone dose pack, gabapentin if still not improving as expected.  F/u prn.

## 2013-07-22 NOTE — Assessment & Plan Note (Signed)
Peripheral neuropathy - most consistent with meralgia paresthetica which is self limited.  Voltaren gel has helped him - advised he can continue to use this.  Other options include prednisone dose pack, gabapentin if still not improving as expected.  F/u prn.

## 2013-07-26 ENCOUNTER — Encounter: Payer: Self-pay | Admitting: *Deleted

## 2013-07-31 ENCOUNTER — Telehealth: Payer: Self-pay | Admitting: Family Medicine

## 2013-07-31 DIAGNOSIS — G609 Hereditary and idiopathic neuropathy, unspecified: Secondary | ICD-10-CM

## 2013-07-31 MED ORDER — PREDNISONE (PAK) 10 MG PO TABS: ORAL_TABLET | ORAL | Status: DC

## 2013-07-31 NOTE — Telephone Encounter (Signed)
Would do a new prednisone dose pack, not old ones that he has.  But I need the pharmacy.  Thanks!

## 2013-08-07 ENCOUNTER — Telehealth: Payer: Self-pay | Admitting: Family Medicine

## 2013-08-07 ENCOUNTER — Other Ambulatory Visit: Payer: Self-pay | Admitting: *Deleted

## 2013-08-07 MED ORDER — GABAPENTIN 300 MG PO CAPS: 300.0000 mg | ORAL_CAPSULE | Freq: Three times a day (TID) | ORAL | Status: DC

## 2013-08-07 NOTE — Telephone Encounter (Signed)
Yes - would recommend trying neurontin 300mg .  Start just at bedtime for 3 days.  Then increase to twice a day for 3 days.  And finally three times a day.  Please call in Neurontin 300mg  1 tablet 3 times a day #90 with 0 refills to patient's pharmacy.  But give him directions above on how to titrate up to that point.  Thanks!

## 2013-08-08 ENCOUNTER — Ambulatory Visit: Payer: Medicare Other | Admitting: Family Medicine

## 2013-08-12 ENCOUNTER — Telehealth: Payer: Self-pay | Admitting: Family Medicine

## 2013-08-12 NOTE — Telephone Encounter (Signed)
What if he only takes it at bedtime?  I would prefer to use this in combination with the voltaren gel if he can take this.

## 2013-08-12 NOTE — Telephone Encounter (Signed)
Called patient and told him to take only one Neurotin at bedtime and to use it in combination with the voltaren gel.

## 2013-09-05 ENCOUNTER — Ambulatory Visit: Payer: Medicare Other | Admitting: Family Medicine

## 2013-09-05 ENCOUNTER — Other Ambulatory Visit: Payer: Self-pay | Admitting: *Deleted

## 2013-09-05 MED ORDER — POTASSIUM CITRATE ER 10 MEQ (1080 MG) PO TBCR: 10.0000 meq | EXTENDED_RELEASE_TABLET | Freq: Three times a day (TID) | ORAL | Status: DC

## 2013-09-08 DIAGNOSIS — E785 Hyperlipidemia, unspecified: Secondary | ICD-10-CM | POA: Insufficient documentation

## 2013-09-09 ENCOUNTER — Other Ambulatory Visit: Payer: Medicare Other

## 2013-09-13 ENCOUNTER — Other Ambulatory Visit: Payer: Self-pay | Admitting: *Deleted

## 2013-09-13 DIAGNOSIS — R030 Elevated blood-pressure reading, without diagnosis of hypertension: Secondary | ICD-10-CM

## 2013-09-13 DIAGNOSIS — E785 Hyperlipidemia, unspecified: Secondary | ICD-10-CM

## 2013-09-13 DIAGNOSIS — R5383 Other fatigue: Principal | ICD-10-CM

## 2013-09-13 DIAGNOSIS — I10 Essential (primary) hypertension: Secondary | ICD-10-CM

## 2013-09-13 DIAGNOSIS — Z0000 Encounter for general adult medical examination without abnormal findings: Secondary | ICD-10-CM

## 2013-09-13 DIAGNOSIS — E559 Vitamin D deficiency, unspecified: Secondary | ICD-10-CM

## 2013-09-13 DIAGNOSIS — R5381 Other malaise: Secondary | ICD-10-CM

## 2013-09-16 ENCOUNTER — Other Ambulatory Visit (INDEPENDENT_AMBULATORY_CARE_PROVIDER_SITE_OTHER): Payer: Medicare Other

## 2013-09-16 LAB — LIPID PANEL
Cholesterol: 142 mg/dL (ref 0–200)
HDL: 40 mg/dL (ref >39)
LDL Cholesterol: 81 mg/dL (ref 0–99)
Total CHOL/HDL Ratio: 3.6 Ratio
Triglycerides: 104 mg/dL (ref <150)
VLDL: 21 mg/dL (ref 0–40)

## 2013-09-16 LAB — COMPLETE METABOLIC PANEL WITH GFR
ALT: 26 U/L (ref 0–53)
AST: 26 U/L (ref 0–37)
Albumin: 4.4 g/dL (ref 3.5–5.2)
Alkaline Phosphatase: 57 U/L (ref 39–117)
BUN: 16 mg/dL (ref 6–23)
CO2: 30 mEq/L (ref 19–32)
Calcium: 9.7 mg/dL (ref 8.4–10.5)
Chloride: 103 mEq/L (ref 96–112)
Creat: 1.15 mg/dL (ref 0.50–1.35)
GFR, Est African American: 75 mL/min
GFR, Est Non African American: 65 mL/min
Glucose, Bld: 93 mg/dL (ref 70–99)
Potassium: 4.4 mEq/L (ref 3.5–5.3)
Sodium: 141 mEq/L (ref 135–145)
Total Bilirubin: 0.7 mg/dL (ref 0.3–1.2)
Total Protein: 6.6 g/dL (ref 6.0–8.3)

## 2013-09-16 LAB — CBC WITH DIFFERENTIAL/PLATELET
Basophils Absolute: 0 10*3/uL (ref 0.0–0.1)
Basophils Relative: 1 % (ref 0–1)
Eosinophils Absolute: 0.2 10*3/uL (ref 0.0–0.7)
Eosinophils Relative: 6 % — ABNORMAL HIGH (ref 0–5)
HCT: 39.6 % (ref 39.0–52.0)
Hemoglobin: 13.8 g/dL (ref 13.0–17.0)
Lymphocytes Relative: 28 % (ref 12–46)
Lymphs Abs: 1.2 10*3/uL (ref 0.7–4.0)
MCH: 33.5 pg (ref 26.0–34.0)
MCHC: 34.8 g/dL (ref 30.0–36.0)
MCV: 96.1 fL (ref 78.0–100.0)
Monocytes Absolute: 0.5 10*3/uL (ref 0.1–1.0)
Monocytes Relative: 11 % (ref 3–12)
Neutro Abs: 2.3 10*3/uL (ref 1.7–7.7)
Neutrophils Relative %: 54 % (ref 43–77)
Platelets: 220 10*3/uL (ref 150–400)
RBC: 4.12 MIL/uL — ABNORMAL LOW (ref 4.22–5.81)
RDW: 12.8 % (ref 11.5–15.5)
WBC: 4.2 10*3/uL (ref 4.0–10.5)

## 2013-09-16 LAB — TSH: TSH: 2.377 u[IU]/mL (ref 0.350–4.500)

## 2013-09-23 ENCOUNTER — Ambulatory Visit: Payer: Medicare Other | Admitting: Family Medicine

## 2013-10-04 ENCOUNTER — Ambulatory Visit: Payer: Medicare Other | Admitting: Family Medicine

## 2013-10-04 ENCOUNTER — Other Ambulatory Visit: Payer: Self-pay | Admitting: Family Medicine

## 2013-10-11 ENCOUNTER — Encounter: Payer: Self-pay | Admitting: Family Medicine

## 2013-10-11 ENCOUNTER — Ambulatory Visit (INDEPENDENT_AMBULATORY_CARE_PROVIDER_SITE_OTHER): Payer: Medicare Other | Admitting: Family Medicine

## 2013-10-11 VITALS — BP 136/76 | HR 109 | Resp 16 | Wt 182.0 lb

## 2013-10-11 DIAGNOSIS — N200 Calculus of kidney: Secondary | ICD-10-CM

## 2013-10-11 DIAGNOSIS — K219 Gastro-esophageal reflux disease without esophagitis: Secondary | ICD-10-CM

## 2013-10-11 DIAGNOSIS — R059 Cough, unspecified: Secondary | ICD-10-CM

## 2013-10-11 DIAGNOSIS — L299 Pruritus, unspecified: Secondary | ICD-10-CM

## 2013-10-11 DIAGNOSIS — E538 Deficiency of other specified B group vitamins: Secondary | ICD-10-CM

## 2013-10-11 DIAGNOSIS — R05 Cough: Secondary | ICD-10-CM

## 2013-10-11 MED ORDER — RANITIDINE HCL 150 MG PO TABS: 150.0000 mg | ORAL_TABLET | Freq: Two times a day (BID) | ORAL | Status: DC

## 2013-10-11 MED ORDER — FOLIC ACID 1 MG PO TABS: 1.0000 mg | ORAL_TABLET | Freq: Every day | ORAL | Status: AC

## 2013-10-11 MED ORDER — POTASSIUM CITRATE ER 10 MEQ (1080 MG) PO TBCR: 20.0000 meq | EXTENDED_RELEASE_TABLET | Freq: Two times a day (BID) | ORAL | Status: AC

## 2013-10-11 MED ORDER — HYDROCODONE-HOMATROPINE 5-1.5 MG/5ML PO SYRP: ORAL_SOLUTION | ORAL | Status: DC

## 2013-10-11 MED ORDER — HYDROXYZINE HCL 25 MG PO TABS: 25.0000 mg | ORAL_TABLET | ORAL | Status: DC | PRN

## 2013-10-11 NOTE — Progress Notes (Signed)
Subjective:    Patient ID: Jon Bush, male    DOB: May 06, 1946, 68 y.o.   MRN: [id number]  HPI  Jon Bush is here today to go over his most recent lab results, get medication refills and go over the conditions listed below:    1)  Anemia - He continues to do well with his folic acid and needs a refill on it.   2)  Itching - His Vistaril has expired.  He would like to have a refill for this which he takes occasionally.    3)  GERD - He needs a refill for Zantac.    4)  Kidney Stone -  He needs a refill on potassium citrate.       Review of Systems  Constitutional: Negative for fatigue and unexpected weight change.  HENT: Positive for congestion.   Respiratory: Positive for cough. Negative for shortness of breath.   Cardiovascular: Negative for chest pain, palpitations and leg swelling.  Gastrointestinal: Negative.   Genitourinary: Negative.   Musculoskeletal: Negative for myalgias.  Skin: Negative.   Neurological: Negative.   Psychiatric/Behavioral: Negative.     Past Medical History  Diagnosis Date  . Kidney stones   . High cholesterol   . BPH (benign prostatic hyperplasia)   . Sleep apnea   . Enlarged prostate   . Migraine headache   . IBS (irritable bowel syndrome)   . Kidney stone   . Central retinal vein occlusion of left eye     ([facility] Dr. Memory Argueraig Grevin [facility]   . Amaurosis fugax of left eye   . Retinal detachment 2005  . Macular hole of left eye 2005  . H. pylori infection 02/14/2013     Past Surgical History  Procedure Laterality Date  . Retinal detachment surgery    . Hernia repair    . Kidney stone surgery    . Lithotripsy       History   Social History Narrative   Marital Status: Divorced Environmental education officer(Jon Bush)   Children:  Sons Jon Bush(Jon Bush, Jon Bush); Daughter Jon Bush(Jon Bush)   Pets:  None    Living Situation: Lives alone.     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 Heart disease Mother   . Hypertension Mother   . Hyperlipidemia Mother   . Heart attack Mother   . Heart disease Father   . Alzheimer's disease Father   . Diabetes Neg Hx      Current Outpatient Prescriptions on File Prior to Visit  Medication Sig Dispense Refill  . amoxicillin (AMOXIL) 500 MG capsule       . aspirin 81 MG tablet Take 81 mg by mouth daily.      . cabergoline (DOSTINEX) 0.5 MG tablet Take 0.25 mg by mouth 2 (two) times a week.      . CIALIS 5 MG tablet       . diclofenac sodium (VOLTAREN) 1 % GEL Apply topically.      . doxazosin (CARDURA) 8 MG tablet Take 1 tablet (8 mg total) by mouth at bedtime.  180 tablet  1  . mometasone (NASONEX) 50 MCG/ACT nasal  spray Place 2 sprays into the nose daily.      . pantoprazole (PROTONIX) 40 MG tablet Take 40 mg by mouth daily.      . pravastatin (PRAVACHOL) 80 MG tablet Take 1 tablet (80 mg total) by mouth at bedtime.  90 tablet  3  . amitriptyline (ELAVIL) 25 MG tablet Take 25 mg by mouth at bedtime.       No current facility-administered medications on file prior to visit.     Allergies  Allergen Reactions  . Codeine      Immunization History  Administered Date(s) Administered  . Pneumococcal Conjugate-13 01/06/2012  . Td 08/22/2004  . Zoster 06/05/2007      Objective:   Physical Exam  Nursing note and vitals reviewed. Constitutional: He is oriented to person, place, and time. He appears well-nourished. No distress.  HENT:  Head: Normocephalic.  Eyes: No scleral icterus.  Neck: Neck supple. No thyromegaly present.  Cardiovascular: Normal rate, regular rhythm and normal heart sounds.   Pulmonary/Chest: Effort normal and breath sounds normal.  Musculoskeletal: Normal range of motion. He exhibits  no edema.  Neurological: He is alert and oriented to person, place, and time.  Skin: Skin is warm and dry. No rash noted.  Psychiatric: He has a normal mood and affect. His behavior is normal. Judgment and thought content normal.      Assessment & Plan:    Milbern was seen today for medication management.  Diagnoses and associated orders for this visit:  GERD (gastroesophageal reflux disease) - ranitidine (ZANTAC) 150 MG tablet; Take 1 tablet (150 mg total) by mouth 2 (two) times daily.  Folic acid deficiency - folic acid (FOLVITE) 1 MG tablet; Take 1 tablet (1 mg total) by mouth daily.  Cough - HYDROcodone-homatropine (HYCODAN) 5-1.5 MG/5ML syrup; Take 1-2 teaspoons at night for cough  Kidney stone - potassium citrate (UROCIT-K) 10 MEQ (1080 MG) SR tablet; Take 2 tablets (20 mEq total) by mouth 2 (two) times daily.  Itching - hydrOXYzine (ATARAX/VISTARIL) 25 MG tablet; Take 1 tablet (25 mg total) by mouth every 4 (four) hours as needed for itching.   TIME SPENT "FACE TO FACE" WITH PATIENT -  30 MINS

## 2013-10-11 NOTE — Patient Instructions (Addendum)
1)  Head Congestion - Mucinex D (Max Strength) 1200/120 Take 1 in the AM and rinse out nasal passage with Lloyd HugerNeil Med Sinus Rinse (Distilled Water and ViacomBlue Packet) before using the Nasacort.    2)  Cough - Delsym 2 tsp twice a day plus Tessalon Perles 1 capsule 3 x per day plus Hycodan 1-2 tsp at night and you may want to try [facility].     Cough, Adult  A cough is a reflex that helps clear your throat and airways. It can help heal the body or may be a reaction to an irritated airway. A cough may only last 2 or 3 weeks (acute) or may last more than 8 weeks (chronic).  CAUSES Acute cough:  Viral or bacterial infections. Chronic cough:  Infections.  Allergies.  Asthma.  Post-nasal drip.  Smoking.  Heartburn or acid reflux.  Some medicines.  Chronic lung problems (COPD).  Cancer. SYMPTOMS   Cough.  Fever.  Chest pain.  Increased breathing rate.  High-pitched whistling sound when breathing (wheezing).  Colored mucus that you cough up (sputum). TREATMENT   A bacterial cough may be treated with antibiotic medicine.  A viral cough must run its course and will not respond to antibiotics.  Your caregiver may recommend other treatments if you have a chronic cough. HOME CARE INSTRUCTIONS   Only take over-the-counter or prescription medicines for pain, discomfort, or fever as directed by your caregiver. Use cough suppressants only as directed by your caregiver.  Use a cold steam vaporizer or humidifier in your bedroom or home to help loosen secretions.  Sleep in a semi-upright position if your cough is worse at night.  Rest as needed.  Stop smoking if you smoke. SEEK IMMEDIATE MEDICAL CARE IF:   You have pus in your sputum.  Your cough starts to worsen.  You cannot control your cough with suppressants and are losing sleep.  You begin coughing up blood.  You have difficulty breathing.  You develop pain which is getting worse or is  uncontrolled with medicine.  You have a fever. MAKE SURE YOU:   Understand these instructions.  Will watch your condition.  Will get help right away if you are not doing well or get worse. Document Released: 02/04/2011 Document Revised: 10/31/2011 Document Reviewed: 02/04/2011 [facility] Patient Information 2014 [place], [place].

## 2013-11-01 ENCOUNTER — Other Ambulatory Visit: Payer: Self-pay | Admitting: *Deleted

## 2013-11-01 ENCOUNTER — Other Ambulatory Visit: Payer: Self-pay | Admitting: Family Medicine

## 2013-11-01 MED ORDER — GABAPENTIN 300 MG PO CAPS: 300.0000 mg | ORAL_CAPSULE | Freq: Three times a day (TID) | ORAL | Status: DC

## 2013-12-02 ENCOUNTER — Ambulatory Visit (INDEPENDENT_AMBULATORY_CARE_PROVIDER_SITE_OTHER): Payer: Medicare Other | Admitting: Family Medicine

## 2013-12-02 ENCOUNTER — Encounter: Payer: Self-pay | Admitting: Family Medicine

## 2013-12-02 VITALS — BP 111/73 | HR 82 | Resp 16 | Wt 183.0 lb

## 2013-12-02 DIAGNOSIS — R0602 Shortness of breath: Secondary | ICD-10-CM

## 2013-12-02 DIAGNOSIS — R079 Chest pain, unspecified: Secondary | ICD-10-CM

## 2013-12-02 DIAGNOSIS — K219 Gastro-esophageal reflux disease without esophagitis: Secondary | ICD-10-CM

## 2013-12-02 NOTE — Patient Instructions (Addendum)
1)  Shortness of Breath - You can try the Combivent 1 puff up to 2 x per day if needed.    2)  Chest Pain - We're going to get you to a cardiologist.  Let us know who/where.  3)  GERD - You can add some GI cocktail as needed.    Diet for Gastroesophageal Reflux Disease, Adult Reflux (acid reflux) is when acid from your stomach flows up into the esophagus. When acid comes in contact with the esophagus, the acid causes irritation and soreness (inflammation) in the esophagus. When reflux happens often or so severely that it causes damage to the esophagus, it is called gastroesophageal reflux disease (GERD). Nutrition therapy can help ease the discomfort of GERD. FOODS OR DRINKS TO AVOID OR LIMIT  Smoking or chewing tobacco. Nicotine is one of the most potent stimulants to acid production in the gastrointestinal tract.  Caffeinated and decaffeinated coffee and black tea.  Regular or low-calorie carbonated beverages or energy drinks (caffeine-free carbonated beverages are allowed).   Strong spices, such as black pepper, white pepper, red pepper, cayenne, curry powder, and chili powder.  Peppermint or spearmint.  Chocolate.  High-fat foods, including meats and fried foods. Extra added fats including oils, butter, salad dressings, and nuts. Limit these to less than 8 tsp per day.  Fruits and vegetables if they are not tolerated, such as citrus fruits or tomatoes.  Alcohol.  Any food that seems to aggravate your condition. If you have questions regarding your diet, call your caregiver or a registered dietitian. OTHER THINGS THAT MAY HELP GERD INCLUDE:   Eating your meals slowly, in a relaxed setting.  Eating 5 to 6 small meals per day instead of 3 large meals.  Eliminating food for a period of time if it causes distress.  Not lying down until 3 hours after eating a meal.  Keeping the head of your bed raised 6 to 9 inches (15 to 23 cm) by using a foam wedge or blocks under the legs of  the bed. Lying flat may make symptoms worse.  Being physically active. Weight loss may be helpful in reducing reflux in overweight or obese adults.  Wear loose fitting clothing EXAMPLE MEAL PLAN This meal plan is approximately 2,000 calories based on https://www.[place].org/ChooseMyPlate.gov meal planning guidelines. Breakfast   cup cooked oatmeal.  1 cup strawberries.  1 cup low-fat milk.  1 oz almonds. Snack  1 cup cucumber slices.  6 oz yogurt (made from low-fat or fat-free milk). Lunch  2 slice whole-wheat bread.  2 oz sliced Mal[place]urkey.  2 tsp mayonnaise.  1 cup blueberries.  1 cup snap peas. Snack  6 whole-wheat crackers.  1 oz string cheese. Dinner     cup brown rice.  1 cup mixed veggies.  1 tsp olive oil.  3 oz grilled fish. Document Released: 08/08/2005 Document Revised: 10/31/2011 Document Reviewed: 06/24/2011 [facility]Exit[facility] Patient Information 2014 [place]Care, [place]LL[place].

## 2013-12-02 NOTE — Progress Notes (Signed)
Subjective:    Patient ID: Jon EmeryRichard Bush, male    DOB: [birth year], 68 y.o.   MRN: [id number]  HPI  Jon Bush is here today to discuss his current GI symptoms.  He was feeling sick on Friday 12/02/13 with vomiting and diarrhea.  He went to [facility].  He was told that he may have a stomach virus and was given Zofran which helped him.  Yesterday he was having more acid reflux and hiccups for about 3.5 hours.  Today he feels short of breath.     Review of Systems  Respiratory: Positive for shortness of breath.   Gastrointestinal:       Acid Reflux  Neurological:       Shaky      Past Medical History  Diagnosis Date  . Kidney stones   . High cholesterol   . BPH (benign prostatic hyperplasia)   . Sleep apnea   . Enlarged prostate   . Migraine headache   . IBS (irritable bowel syndrome)   . Kidney stone   . Central retinal vein occlusion of left eye     (Left) Dr. Guy Beginobert DeVanzo [facility] Dr. Memory Argueraig [facility])   . Amaurosis fugax of left eye   . Retinal detachment 2005  . Macular hole of left eye 2005  . H. pylori infection 02/14/2013     Past Surgical History  Procedure Laterality Date  . Retinal detachment surgery    . Hernia repair    . Kidney stone surgery    . Lithotripsy       History   Social History Narrative   Marital Status: Divorced Environmental education officer([name])   Children:  Sons Gabriel Rung(Shayne, Casimiro NeedleMichael); Daughter Maralyn Sago(Sarah)   Pets:  None    Living Situation: Lives alone.     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 Heart disease Mother   . Hypertension Mother   . Hyperlipidemia Mother   . Heart attack Mother   . Heart disease Father   . Alzheimer's disease Father   .  Diabetes Neg Hx      Current Outpatient Prescriptions on File Prior to Visit  Medication Sig Dispense Refill  . amitriptyline (ELAVIL) 25 MG tablet Take 25 mg by mouth at bedtime.      Marland Kitchen. amoxicillin (AMOXIL) 500 MG capsule       . aspirin 81 MG tablet Take 81 mg by mouth daily.      . cabergoline (DOSTINEX) 0.5 MG tablet Take 0.25 mg by mouth 2 (two) times a week.      . CIALIS 5 MG tablet       . diclofenac sodium (VOLTAREN) 1 % GEL Apply topically.      . doxazosin (CARDURA) 8 MG tablet Take 1 tablet (8 mg total) by mouth at bedtime.  180 tablet  1  . HYDROcodone-homatropine (HYCODAN) 5-1.5 MG/5ML syrup Take 1-2 teaspoons at night for cough  240 mL  0  . hydrOXYzine (ATARAX/VISTARIL) 25 MG tablet Take 1 tablet (25 mg total) by mouth every 4 (four) hours as needed for itching.  60 tablet  1  .  mometasone (NASONEX) 50 MCG/ACT nasal spray Place 2 sprays into the nose daily.      . niacin 500 MG tablet Take 500 mg by mouth at bedtime.      . pantoprazole (PROTONIX) 40 MG tablet Take 40 mg by mouth daily.      . potassium citrate (UROCIT-K) 10 MEQ (1080 MG) SR tablet Take 2 tablets (20 mEq total) by mouth 2 (two) times daily.  360 tablet  3  . pravastatin (PRAVACHOL) 80 MG tablet Take 1 tablet (80 mg total) by mouth at bedtime.  90 tablet  3  . ranitidine (ZANTAC) 150 MG tablet Take 1 tablet (150 mg total) by mouth 2 (two) times daily.  180 tablet  3   No current facility-administered medications on file prior to visit.     Allergies  Allergen Reactions  . Codeine      Immunization History  Administered Date(s) Administered  . Pneumococcal Conjugate-13 01/06/2012  . Td 08/22/2004  . Zoster 06/05/2007       Objective:   Physical Exam  Constitutional: He is oriented to person, place, and time. He appears well-nourished. No distress.  HENT:  Head: Normocephalic.  Eyes: No scleral icterus.  Neck: Neck supple. No thyromegaly present.  Cardiovascular: Normal rate, regular rhythm  and normal heart sounds.  Exam reveals no gallop and no friction rub.   No murmur heard. Pulmonary/Chest: Breath sounds normal. No respiratory distress. He exhibits no tenderness.  Musculoskeletal: He exhibits no edema.  Neurological: He is alert and oriented to person, place, and time.  Skin: Skin is warm and dry. No rash noted.  Psychiatric: He has a normal mood and affect. His behavior is normal. Judgment and thought content normal.      Assessment & Plan:    Jon Bush was seen today for gerd.  Diagnoses and associated orders for this visit:  Gastroesophageal reflux disease without esophagitis Comments: He is to take some GI cocktail as needed.    Shortness of breath Comments: He was given a sample of Combivent to try for his SOB.    Chest pain, unspecified chest pain type Comments: We are going to refer Jon Burdockichard to a cardiologist.  He will let us know who he wants to see.     TIME SPENT "FACE TO FACE" WITH PATIENT -  30 MINS

## 2013-12-03 ENCOUNTER — Telehealth: Payer: Self-pay | Admitting: *Deleted

## 2013-12-03 NOTE — Telephone Encounter (Signed)
Left voice mail to inform him his appointment with Dr Beverely Paceheek on 01/07/14 at 2:30 pm.  He was instructed to contact their office in case he needs to reschedule. PG

## 2014-01-27 ENCOUNTER — Other Ambulatory Visit: Payer: Self-pay | Admitting: Family Medicine

## 2014-01-28 ENCOUNTER — Other Ambulatory Visit: Payer: Self-pay | Admitting: *Deleted

## 2014-01-28 MED ORDER — GABAPENTIN 300 MG PO CAPS: 300.0000 mg | ORAL_CAPSULE | Freq: Three times a day (TID) | ORAL | Status: DC

## 2014-02-09 DIAGNOSIS — R0602 Shortness of breath: Secondary | ICD-10-CM | POA: Insufficient documentation

## 2014-02-09 DIAGNOSIS — K219 Gastro-esophageal reflux disease without esophagitis: Secondary | ICD-10-CM | POA: Insufficient documentation

## 2014-02-09 DIAGNOSIS — R079 Chest pain, unspecified: Secondary | ICD-10-CM | POA: Insufficient documentation

## 2014-05-04 ENCOUNTER — Other Ambulatory Visit: Payer: Self-pay | Admitting: Family Medicine

## 2014-05-05 ENCOUNTER — Other Ambulatory Visit: Payer: Self-pay | Admitting: *Deleted

## 2014-05-05 MED ORDER — GABAPENTIN 300 MG PO CAPS: 300.0000 mg | ORAL_CAPSULE | Freq: Three times a day (TID) | ORAL | Status: DC

## 2014-07-28 ENCOUNTER — Other Ambulatory Visit: Payer: Self-pay | Admitting: Family Medicine

## 2014-08-05 ENCOUNTER — Ambulatory Visit (INDEPENDENT_AMBULATORY_CARE_PROVIDER_SITE_OTHER): Payer: Medicare Other | Admitting: Family Medicine

## 2014-08-05 ENCOUNTER — Encounter: Payer: Self-pay | Admitting: Family Medicine

## 2014-08-05 VITALS — BP 136/82 | HR 78 | Ht 69.0 in | Wt 189.0 lb

## 2014-08-05 DIAGNOSIS — G609 Hereditary and idiopathic neuropathy, unspecified: Secondary | ICD-10-CM

## 2014-08-05 DIAGNOSIS — M7541 Impingement syndrome of right shoulder: Secondary | ICD-10-CM

## 2014-08-05 MED ORDER — GABAPENTIN 300 MG PO CAPS: 300.0000 mg | ORAL_CAPSULE | Freq: Three times a day (TID) | ORAL | Status: DC

## 2014-08-05 NOTE — Patient Instructions (Signed)
Try off the neurontin for the next couple weeks and see how you do. If the numbness worsens or the twinge frequency/duration gets worse, go back on the neurontin (gabapentin). You can ask Dr. Alberteen SamZanard to take over prescribing this also if you're already seeing her regularly (it will save you a trip to see us unless it's for something else).  You have rotator cuff impingement Try to avoid painful activities (overhead activities, lifting with extended arm) as much as possible. Voltaren gel up to 4 times a day to this shoulder. Can take tylenol in addition to this. Do home exercise program with theraband and scapular stabilization exercises daily - these are very important for long term relief even if an injection was given. If not improving over a month to 6 weeks, I would recommend physical therapy, an injection, and/or nitro patches.

## 2014-08-06 ENCOUNTER — Telehealth: Payer: Self-pay | Admitting: Family Medicine

## 2014-08-06 NOTE — Telephone Encounter (Signed)
It's just for pain as needed.

## 2014-08-06 NOTE — Telephone Encounter (Signed)
Spoke with patient and told him that the Voltaren was for pain as needed.

## 2014-08-07 DIAGNOSIS — M7541 Impingement syndrome of right shoulder: Secondary | ICD-10-CM | POA: Insufficient documentation

## 2014-08-07 NOTE — Progress Notes (Signed)
Patient ID: Jon EmeryRichard Bush, male   DOB: July 25, 1946, 68 y.o.   MRN: [id number]  PCP: Birdena JubileeZANARD, ROBYN, MD  Subjective:   HPI: Patient is a 68 y.o. male here for right thigh pain.  07/17/13: Patient denies known injury. States for about 3-4 weeks he has had a burning sensation anterolateral right thigh. Tried advil, voltaren gel.  Voltaren gel has helped a lot. No back pain. No radiation beyond the anterolateral thigh. No bowel/bladder dysfunction. Associated numbness in same distribution. Doesn't have anything where he sits that pushes on hip, pelvis.  Does not wear tight pants.  08/05/14: Patient reports right thigh is improved. Every few days he gets a twinge right thigh that lasts only seconds. Other issue is right shoulder pain - wakes him up at night. Was told by an orthopedic he may have a tear. Recommended by them that he not do any upper body workouts other than arm curls. Bothers with overhead motions.  Past Medical History  Diagnosis Date  . Kidney stones   . High cholesterol   . BPH (benign prostatic hyperplasia)   . Sleep apnea   . Enlarged prostate   . Migraine headache   . IBS (irritable bowel syndrome)   . Kidney stone   . Central retinal vein occlusion of left eye     (Left) Dr. Guy Beginobert [facility]zo [facility]); Dr. Memory Argueraig Grevin [facility])   . Amaurosis fugax of left eye   . Retinal detachment 2005  . Macular hole of left eye 2005  . H. pylori infection 02/14/2013    Current Outpatient Prescriptions on File Prior to Visit  Medication Sig Dispense Refill  . amitriptyline (ELAVIL) 25 MG tablet Take 25 mg by mouth at bedtime.    Marland Kitchen. amoxicillin (AMOXIL) 500 MG capsule     . aspirin 81 MG tablet Take 81 mg by mouth daily.    . cabergoline (DOSTINEX) 0.5 MG tablet Take 0.25 mg by mouth 2 (two) times a week.    . CIALIS 5 MG tablet     . diclofenac sodium (VOLTAREN) 1 % GEL Apply topically.    . doxazosin (CARDURA) 8 MG tablet Take 1 tablet (8 mg total) by mouth at  bedtime. 180 tablet 1  . HYDROcodone-homatropine (HYCODAN) 5-1.5 MG/5ML syrup Take 1-2 teaspoons at night for cough 240 mL 0  . hydrOXYzine (ATARAX/VISTARIL) 25 MG tablet Take 1 tablet (25 mg total) by mouth every 4 (four) hours as needed for itching. 60 tablet 1  . mometasone (NASONEX) 50 MCG/ACT nasal spray Place 2 sprays into the nose daily.    . niacin 500 MG tablet Take 500 mg by mouth at bedtime.    . ondansetron (ZOFRAN) 4 MG tablet Take 4 mg by mouth every 8 (eight) hours as needed for nausea or vomiting.    . pantoprazole (PROTONIX) 40 MG tablet Take 40 mg by mouth daily.    . potassium citrate (UROCIT-K) 10 MEQ (1080 MG) SR tablet Take 2 tablets (20 mEq total) by mouth 2 (two) times daily. 360 tablet 3  . pravastatin (PRAVACHOL) 80 MG tablet Take 1 tablet (80 mg total) by mouth at bedtime. 90 tablet 3  . ranitidine (ZANTAC) 150 MG tablet Take 1 tablet (150 mg total) by mouth 2 (two) times daily. 180 tablet 3   No current facility-administered medications on file prior to visit.    Past Surgical History  Procedure Laterality Date  . Retinal detachment surgery    . Hernia repair    . Kidney  stone surgery    . Lithotripsy      Allergies  Allergen Reactions  . Codeine     History   Social History  . Marital Status: Divorced    Spouse Name: N/A    Number of Children: 3  . Years of Education: N/A   Occupational History  . PROFESSOR Other    [employer]   Social History Main Topics  . Smoking status: Never Smoker   . Smokeless tobacco: Never Used  . Alcohol Use: 0.0 oz/week    0 Not specified per week     Comment: Rarely  . Drug Use: No  . Sexual Activity:    Partners: Female   Other Topics Concern  . Not on file   Social History Narrative   Marital Status: Divorced Environmental education officer([name])   Children:  Sons Gabriel Rung(Shayne, Casimiro NeedleMichael); Daughter Maralyn Sago(Sarah)   Pets:  None    Living Situation: Lives alone.     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 Heart disease Mother   . Hypertension Mother   . Hyperlipidemia Mother   . Heart attack Mother   . Heart disease Father   . Alzheimer's disease Father   . Diabetes Neg Hx     BP 136/82 mmHg  Pulse 78  Ht 5\' 9"  (1.753 m)  Wt 189 lb (85.73 kg)  BMI 27.90 kg/m2  Review of Systems: See HPI above.    Objective:  Physical Exam:  Gen: NAD  Right hip: No gross deformity, scoliosis. No hip, right leg TTP.  No midline or bony TTP. FROM hip without pain. Strength LEs 5/5 all muscle groups.   Negative SLRs. Sensation diminished mid thigh anterior and laterally.  No other changes. Negative logroll bilateral hips  Right shoulder: No swelling, ecchymoses.  No gross deformity. No TTP. FROM with painful arc. Positive Hawkins, Neers. Negative Speeds, Yergasons. Strength 5/5 with empty can and resisted internal/external rotation.  Pain empty can. Negative apprehension. NV intact distally.   Assessment & Plan:  1. Peripheral neuropathy - most consistent with meralgia paresthetica vs other peripheral neuropathy.  Has improved however.  Uses voltaren gel as needed.  Still takes neurontin - he will trial off this for 2-3 weeks and see if symptoms worsen - if so would have him on this at bedtime indefinitely (could take up to three times a day).  Also advised him his PCP could take over prescribing this medicine.  2. Right shoulder rotator cuff impingement - reviewed home exercise program to do daily.  Discussed physical therapy, injection, nitro patches if not improving.  Voltaren gel as needed.

## 2014-08-07 NOTE — Assessment & Plan Note (Signed)
Right shoulder rotator cuff impingement - reviewed home exercise program to do daily.  Discussed physical therapy, injection, nitro patches if not improving.  Voltaren gel as needed.

## 2014-08-07 NOTE — Assessment & Plan Note (Signed)
most consistent with meralgia paresthetica vs other peripheral neuropathy.  Has improved however.  Uses voltaren gel as needed.  Still takes neurontin - he will trial off this for 2-3 weeks and see if symptoms worsen - if so would have him on this at bedtime indefinitely (could take up to three times a day).  Also advised him his PCP could take over prescribing this medicine.

## 2014-08-12 ENCOUNTER — Encounter: Payer: Self-pay | Admitting: *Deleted

## 2016-02-15 ENCOUNTER — Encounter (HOSPITAL_COMMUNITY): Payer: Self-pay

## 2016-02-15 ENCOUNTER — Encounter (HOSPITAL_COMMUNITY)
Admission: RE | Admit: 2016-02-15 | Discharge: 2016-02-15 | Disposition: A | Discharge status: Home / Self Care | Payer: Medicare Other | Source: Ambulatory Visit | Attending: Orthopedic Surgery | Admitting: Orthopedic Surgery

## 2016-02-15 HISTORY — DX: Anemia, unspecified: D64.9

## 2016-02-15 HISTORY — DX: Essential (primary) hypertension: I10

## 2016-02-15 LAB — BASIC METABOLIC PANEL
Anion gap: 9 (ref 5–15)
BUN: 21 mg/dL — ABNORMAL HIGH (ref 6–20)
CHLORIDE: 99 mmol/L — AB (ref 101–111)
CO2: 24 mmol/L (ref 22–32)
CREATININE: 1.24 mg/dL (ref 0.61–1.24)
Calcium: 9.7 mg/dL (ref 8.9–10.3)
GFR calc Af Amer: >60 mL/min (ref >60)
GFR calc non Af Amer: 56 mL/min — ABNORMAL LOW (ref >60)
GLUCOSE: 114 mg/dL — AB (ref 65–99)
Potassium: 3.9 mmol/L (ref 3.5–5.1)
Sodium: 132 mmol/L — ABNORMAL LOW (ref 135–145)

## 2016-02-15 LAB — CBC
HEMATOCRIT: 36.6 % — AB (ref 39.0–52.0)
HEMOGLOBIN: 12.8 g/dL — AB (ref 13.0–17.0)
MCH: 32.9 pg (ref 26.0–34.0)
MCHC: 35 g/dL (ref 30.0–36.0)
MCV: 94.1 fL (ref 78.0–100.0)
Platelets: 290 10*3/uL (ref 150–400)
RBC: 3.89 MIL/uL — ABNORMAL LOW (ref 4.22–5.81)
RDW: 13 % (ref 11.5–15.5)
WBC: 7 10*3/uL (ref 4.0–10.5)

## 2016-02-15 LAB — SURGICAL PCR SCREEN
MRSA, PCR: NEGATIVE
STAPHYLOCOCCUS AUREUS: POSITIVE — AB

## 2016-02-15 NOTE — Pre-Procedure Instructions (Signed)
    Merwyn Capelle  02/15/2016      [id number] [place] [address] [place] [id number] Phone: [phone] Fax: [phone]  [facility] - [place] - [address] [place] [address] Phone: [phone] Fax: [phone]    Your procedure is scheduled on 02/18/16.  [facility] at 530 A.M.  Call this number if you have problems the morning of surgery:  [phone]   Remember:  Do not eat food or drink liquids after midnight.  Take these medicines the morning of surgery with A SIP OF WATER --hydrocodone,protonix Do not take any aspirin,anti-inflammatories,vitamins,or herbal supplements 5-7 days prior to surgery.  Do not wear jewelry, make-up or nail polish.  Do not wear lotions, powders, or perfumes.  You may wear deoderant.  Do not shave 48 hours prior to surgery.  Men may shave face and neck.  Do not bring valuables to the hospital.  [facility] is not responsible for any belongings or valuables.  Contacts, dentures or bridgework may not be worn into surgery.  Leave your suitcase in the car.  After surgery it may be brought to your room.  For patients admitted to the hospital, discharge time will be determined by your treatment team.  Patients discharged the day of surgery will not be allowed to drive home.   Name and phone number of your driver:   Special instructions:   Please read over the following fact sheets that you were given. MRSA Information

## 2016-02-16 NOTE — Progress Notes (Signed)
Anesthesia Chart Review: Patient is a 70 year old male scheduled for right shoulder reverse arthroplasty on 02/18/16 by Dr. Rennis ChrisSupple.  History includes non-smoker, hypercholesterolemia, nephrolithiasis, BPH s/p PVP 01/08/15 and DVIU (for urethral stricture) 07/14/15, migraines, IBS, HTN, OSA (CPAP), anemia, central retinal vein occlusion (left eye), retinal detachment surgery '05. Hospitalized for cystoscopy with urethrotomy (for recurrent anterior urethral stricture) [facility]. Cardiologist Dr. Judithe ModestFolk was consulted due to EKG showing blocked/non-conducted PACs. Consideration for vagal component since arrhythmia occurred just having completed cystoscopy. Patient was asymptomatic and reported that he exercised regularly at home without CV symptoms. Plans were make for patient to follow-up with Dr. Karleen HampshireBarry Cheek with out-patient Holter monitor.  PCPis Dr. Birdena Jubileeobyn Zanard. Urologist is Dr. Tobie Lordsimothy Mullins.   Meds include ASA 81 mg (on hold), 65 Fe, Folvite, Hycodan, hydroxyzine, Nasonex, Protonix, pravastatin, ranitidine, Micardis HCT.  02/12/16 EKG ([facility]): SB at 54 bpm with marked sinus arrhythmia, first degree AVB, incomplete right BBB. First degree heart block is new. Incomplete right BBB was cited on 06/19/11 tracing. Q waves in inferior leads III and aVF (not II) are noted on tracing dating back to 01/06/15 (according to 07/14/15 tracing) but not on 08/20/14 EKG.   Preoperative labs noted. Cr 1.24, H/H 12.8/36.6. Glucose 114.   I reviewed above with anesthesiologist Dr. Jacklynn BueMassagee including EKG tracing mentioned above. Patient with known right BBB. Recent blocked PACs after cystoscopy last week. No urgent testing ordered by cardiologist, but out-patient Holter planned in the future. Patient's will be on telemetry during nerve block (if done), intra-operatively, and PAC. Anesthesiologist to evaluate on the day of surgery to ensure no acute changes. I'll order another EKG on arrival  to evaluate for any progressive heart block.   Velna Ochsllison Brissia Delisa, PA-C [facility]hesiology Phone [phone] 02/16/2016 4:17 PM

## 2016-02-17 MED ORDER — CEFAZOLIN SODIUM-DEXTROSE 2-4 GM/100ML-% IV SOLN
2.0000 g | INTRAVENOUS | Status: AC
  Administered 2016-02-18: 2 g via INTRAVENOUS
  Filled 2016-02-17: qty 100

## 2016-02-17 NOTE — Anesthesia Preprocedure Evaluation (Addendum)
Anesthesia Evaluation  Patient identified by MRN, date of birth, ID band Patient awake    Reviewed: Allergy & Precautions, NPO status , Patient's Chart, lab work & pertinent test results  Airway Mallampati: IV  TM Distance: >3 FB Neck ROM: Full    Dental  (+) Teeth Intact, Dental Advisory Given   Pulmonary sleep apnea ,    breath sounds clear to auscultation       Cardiovascular hypertension, + Peripheral Vascular Disease   Rhythm:Regular Rate:Normal     Neuro/Psych  Headaches,  Neuromuscular disease negative psych ROS   GI/Hepatic GERD  Medicated,  Endo/Other  negative endocrine ROS  Renal/GU Renal disease  negative genitourinary   Musculoskeletal negative musculoskeletal ROS (+)   Abdominal   Peds negative pediatric ROS (+)  Hematology   Anesthesia Other Findings   Reproductive/Obstetrics negative OB ROS                           Lab Results  Component Value Date   WBC 7.0 02/15/2016   HGB 12.8* 02/15/2016   HCT 36.6* 02/15/2016   MCV 94.1 02/15/2016   PLT 290 02/15/2016   Lab Results  Component Value Date   CREATININE 1.24 02/15/2016   BUN 21* 02/15/2016   NA 132* 02/15/2016   K 3.9 02/15/2016   CL 99* 02/15/2016   CO2 24 02/15/2016   No results found for: INR, PROTIME   Anesthesia Physical Anesthesia Plan  ASA: II  Anesthesia Plan: General   Post-op Pain Management: GA combined w/ Regional for post-op pain   Induction: Intravenous  Airway Management Planned: Oral ETT and Video Laryngoscope Planned  Additional Equipment:   Intra-op Plan:   Post-operative Plan: Extubation in OR  Informed Consent: I have reviewed the patients History and Physical, chart, labs and discussed the procedure including the risks, benefits and alternatives for the proposed anesthesia with the patient or authorized representative who has indicated his/her understanding and acceptance.    Dental advisory given  Plan Discussed with: CRNA  Anesthesia Plan Comments:        Anesthesia Quick Evaluation

## 2016-02-18 ENCOUNTER — Inpatient Hospital Stay (HOSPITAL_COMMUNITY)
Admission: RE | Admit: 2016-02-18 | Discharge: 2016-02-19 | DRG: 483 | Disposition: A | Discharge status: Home Health | Payer: Medicare Other | Source: Ambulatory Visit | Attending: Orthopedic Surgery | Admitting: Orthopedic Surgery

## 2016-02-18 ENCOUNTER — Encounter (HOSPITAL_COMMUNITY): Payer: Self-pay | Admitting: *Deleted

## 2016-02-18 ENCOUNTER — Inpatient Hospital Stay (HOSPITAL_COMMUNITY): Payer: Medicare Other | Admitting: Vascular Surgery

## 2016-02-18 ENCOUNTER — Encounter (HOSPITAL_COMMUNITY)
Admission: RE | Disposition: A | Discharge status: Home Health | Payer: Self-pay | Source: Ambulatory Visit | Attending: Orthopedic Surgery

## 2016-02-18 DIAGNOSIS — I10 Essential (primary) hypertension: Secondary | ICD-10-CM | POA: Diagnosis present

## 2016-02-18 DIAGNOSIS — N400 Enlarged prostate without lower urinary tract symptoms: Secondary | ICD-10-CM | POA: Diagnosis present

## 2016-02-18 DIAGNOSIS — M75101 Unspecified rotator cuff tear or rupture of right shoulder, not specified as traumatic: Secondary | ICD-10-CM | POA: Diagnosis present

## 2016-02-18 DIAGNOSIS — Z7982 Long term (current) use of aspirin: Secondary | ICD-10-CM | POA: Diagnosis not present

## 2016-02-18 DIAGNOSIS — Z792 Long term (current) use of antibiotics: Secondary | ICD-10-CM | POA: Diagnosis not present

## 2016-02-18 DIAGNOSIS — Z96619 Presence of unspecified artificial shoulder joint: Secondary | ICD-10-CM

## 2016-02-18 DIAGNOSIS — Z8249 Family history of ischemic heart disease and other diseases of the circulatory system: Secondary | ICD-10-CM | POA: Diagnosis not present

## 2016-02-18 DIAGNOSIS — G4730 Sleep apnea, unspecified: Secondary | ICD-10-CM | POA: Diagnosis present

## 2016-02-18 DIAGNOSIS — E7800 Pure hypercholesterolemia, unspecified: Secondary | ICD-10-CM | POA: Diagnosis present

## 2016-02-18 DIAGNOSIS — Z820 Family history of epilepsy and other diseases of the nervous system: Secondary | ICD-10-CM

## 2016-02-18 DIAGNOSIS — K589 Irritable bowel syndrome without diarrhea: Secondary | ICD-10-CM | POA: Diagnosis present

## 2016-02-18 DIAGNOSIS — M12811 Other specific arthropathies, not elsewhere classified, right shoulder: Secondary | ICD-10-CM | POA: Diagnosis present

## 2016-02-18 DIAGNOSIS — Z96611 Presence of right artificial shoulder joint: Secondary | ICD-10-CM

## 2016-02-18 DIAGNOSIS — Z79899 Other long term (current) drug therapy: Secondary | ICD-10-CM | POA: Diagnosis not present

## 2016-02-18 DIAGNOSIS — G43909 Migraine, unspecified, not intractable, without status migrainosus: Secondary | ICD-10-CM | POA: Diagnosis present

## 2016-02-18 DIAGNOSIS — D649 Anemia, unspecified: Secondary | ICD-10-CM | POA: Diagnosis present

## 2016-02-18 HISTORY — PX: REVERSE SHOULDER ARTHROPLASTY: SHX5054

## 2016-02-18 SURGERY — ARTHROPLASTY, SHOULDER, TOTAL, REVERSE
Anesthesia: General | Laterality: Right

## 2016-02-18 MED ORDER — MIDAZOLAM HCL 5 MG/5ML IJ SOLN: INTRAMUSCULAR | Status: DC | PRN

## 2016-02-18 MED ORDER — ONDANSETRON HCL 4 MG/2ML IJ SOLN: 4.0000 mg | Freq: Four times a day (QID) | INTRAMUSCULAR | Status: DC | PRN

## 2016-02-18 MED ORDER — PROPOFOL 10 MG/ML IV BOLUS
INTRAVENOUS | Status: DC | PRN
  Administered 2016-02-18: 130 mg via INTRAVENOUS

## 2016-02-18 MED ORDER — MEPERIDINE HCL 25 MG/ML IJ SOLN: 6.2500 mg | INTRAMUSCULAR | Status: DC | PRN

## 2016-02-18 MED ORDER — CHLORHEXIDINE GLUCONATE 4 % EX LIQD
60.0000 mL | Freq: Once | CUTANEOUS | Status: DC
Start: 2016-02-18 — End: 2016-02-18

## 2016-02-18 MED ORDER — METOCLOPRAMIDE HCL 5 MG/ML IJ SOLN: 5.0000 mg | Freq: Three times a day (TID) | INTRAMUSCULAR | Status: DC | PRN

## 2016-02-18 MED ORDER — LACTATED RINGERS IV SOLN: INTRAVENOUS | Status: DC

## 2016-02-18 MED ORDER — HYDROXYZINE HCL 25 MG PO TABS
25.0000 mg | ORAL_TABLET | ORAL | Status: DC | PRN
  Administered 2016-02-18: 25 mg via ORAL
  Filled 2016-02-18: qty 1

## 2016-02-18 MED ORDER — FAMOTIDINE 20 MG PO TABS
10.0000 mg | ORAL_TABLET | Freq: Two times a day (BID) | ORAL | Status: DC
  Administered 2016-02-18 – 2016-02-19 (×2): 10 mg via ORAL
  Filled 2016-02-18 (×2): qty 1

## 2016-02-18 MED ORDER — LIDOCAINE 2% (20 MG/ML) 5 ML SYRINGE
INTRAMUSCULAR | Status: AC
  Filled 2016-02-18: qty 5

## 2016-02-18 MED ORDER — SUGAMMADEX SODIUM 200 MG/2ML IV SOLN
INTRAVENOUS | Status: AC
  Filled 2016-02-18: qty 2

## 2016-02-18 MED ORDER — MAGNESIUM CITRATE PO SOLN: 1.0000 | Freq: Once | ORAL | Status: DC | PRN

## 2016-02-18 MED ORDER — TELMISARTAN-HCTZ 40-12.5 MG PO TABS: 1.0000 | ORAL_TABLET | Freq: Every morning | ORAL | Status: DC

## 2016-02-18 MED ORDER — MIDAZOLAM HCL 2 MG/2ML IJ SOLN
INTRAMUSCULAR | Status: AC
  Filled 2016-02-18: qty 2

## 2016-02-18 MED ORDER — PHENOL 1.4 % MT LIQD: 1.0000 | OROMUCOSAL | Status: DC | PRN

## 2016-02-18 MED ORDER — SUCCINYLCHOLINE CHLORIDE 200 MG/10ML IV SOSY
PREFILLED_SYRINGE | INTRAVENOUS | Status: AC
  Filled 2016-02-18: qty 10

## 2016-02-18 MED ORDER — MIDAZOLAM HCL 5 MG/5ML IJ SOLN
INTRAMUSCULAR | Status: DC | PRN
Start: 2016-02-18 — End: 2016-02-18
  Administered 2016-02-18 (×2): 1 mg via INTRAVENOUS

## 2016-02-18 MED ORDER — ACETAMINOPHEN 650 MG RE SUPP: 650.0000 mg | Freq: Four times a day (QID) | RECTAL | Status: DC | PRN

## 2016-02-18 MED ORDER — SUGAMMADEX SODIUM 200 MG/2ML IV SOLN
INTRAVENOUS | Status: DC | PRN
  Administered 2016-02-18: 175 mg via INTRAVENOUS

## 2016-02-18 MED ORDER — ROCURONIUM BROMIDE 50 MG/5ML IV SOLN
INTRAVENOUS | Status: AC
  Filled 2016-02-18: qty 1

## 2016-02-18 MED ORDER — ONDANSETRON HCL 4 MG/2ML IJ SOLN
INTRAMUSCULAR | Status: DC | PRN
  Administered 2016-02-18: 4 mg via INTRAVENOUS

## 2016-02-18 MED ORDER — PRAVASTATIN SODIUM 40 MG PO TABS: 80.0000 mg | ORAL_TABLET | Freq: Every day | ORAL | Status: DC

## 2016-02-18 MED ORDER — ROCURONIUM BROMIDE 100 MG/10ML IV SOLN
INTRAVENOUS | Status: DC | PRN
  Administered 2016-02-18: 30 mg via INTRAVENOUS

## 2016-02-18 MED ORDER — ASPIRIN EC 81 MG PO TBEC
81.0000 mg | DELAYED_RELEASE_TABLET | Freq: Every day | ORAL | Status: DC
  Administered 2016-02-18 – 2016-02-19 (×2): 81 mg via ORAL
  Filled 2016-02-18 (×2): qty 1

## 2016-02-18 MED ORDER — ALUM & MAG HYDROXIDE-SIMETH 200-200-20 MG/5ML PO SUSP
30.0000 mL | ORAL | Status: DC | PRN
  Administered 2016-02-18: 30 mL via ORAL
  Filled 2016-02-18: qty 30

## 2016-02-18 MED ORDER — IRBESARTAN 150 MG PO TABS
150.0000 mg | ORAL_TABLET | Freq: Every day | ORAL | Status: DC
  Administered 2016-02-18 – 2016-02-19 (×2): 150 mg via ORAL
  Filled 2016-02-18 (×2): qty 1

## 2016-02-18 MED ORDER — HYDROMORPHONE HCL 1 MG/ML IJ SOLN
INTRAMUSCULAR | Status: AC
  Filled 2016-02-18: qty 1

## 2016-02-18 MED ORDER — MENTHOL 3 MG MT LOZG
1.0000 | LOZENGE | OROMUCOSAL | Status: DC | PRN
  Filled 2016-02-18: qty 9

## 2016-02-18 MED ORDER — HYDROCHLOROTHIAZIDE 12.5 MG PO CAPS
12.5000 mg | ORAL_CAPSULE | Freq: Every day | ORAL | Status: DC
  Administered 2016-02-18 – 2016-02-19 (×2): 12.5 mg via ORAL
  Filled 2016-02-18 (×2): qty 1

## 2016-02-18 MED ORDER — LACTATED RINGERS IV SOLN
INTRAVENOUS | Status: DC | PRN
  Administered 2016-02-18 (×2): via INTRAVENOUS

## 2016-02-18 MED ORDER — TRANEXAMIC ACID 1000 MG/10ML IV SOLN
1000.0000 mg | INTRAVENOUS | Status: AC
  Administered 2016-02-18: 1000 mg via INTRAVENOUS
  Filled 2016-02-18: qty 10

## 2016-02-18 MED ORDER — FENTANYL CITRATE (PF) 250 MCG/5ML IJ SOLN
INTRAMUSCULAR | Status: AC
  Filled 2016-02-18: qty 5

## 2016-02-18 MED ORDER — CEFAZOLIN SODIUM-DEXTROSE 2-4 GM/100ML-% IV SOLN
2.0000 g | Freq: Four times a day (QID) | INTRAVENOUS | Status: AC
  Administered 2016-02-18 – 2016-02-19 (×3): 2 g via INTRAVENOUS
  Filled 2016-02-18 (×3): qty 100

## 2016-02-18 MED ORDER — POLYETHYLENE GLYCOL 3350 17 G PO PACK: 17.0000 g | PACK | Freq: Every day | ORAL | Status: DC | PRN

## 2016-02-18 MED ORDER — OXYCODONE HCL 5 MG PO TABS
5.0000 mg | ORAL_TABLET | ORAL | Status: DC | PRN
  Administered 2016-02-18 – 2016-02-19 (×5): 10 mg via ORAL
  Filled 2016-02-18 (×5): qty 2

## 2016-02-18 MED ORDER — LACTATED RINGERS IV SOLN
INTRAVENOUS | Status: DC
  Administered 2016-02-18: 21:00:00 via INTRAVENOUS

## 2016-02-18 MED ORDER — PHENYLEPHRINE HCL 10 MG/ML IJ SOLN
10.0000 mg | INTRAVENOUS | Status: DC | PRN
  Administered 2016-02-18: 20 ug/min via INTRAVENOUS

## 2016-02-18 MED ORDER — DIAZEPAM 5 MG PO TABS
2.5000 mg | ORAL_TABLET | Freq: Four times a day (QID) | ORAL | Status: DC | PRN
  Administered 2016-02-18 – 2016-02-19 (×2): 5 mg via ORAL
  Filled 2016-02-18 (×2): qty 1

## 2016-02-18 MED ORDER — GLYCOPYRROLATE 0.2 MG/ML IJ SOLN
INTRAMUSCULAR | Status: DC | PRN
  Administered 2016-02-18 (×2): 0.2 mg via INTRAVENOUS

## 2016-02-18 MED ORDER — FENTANYL CITRATE (PF) 100 MCG/2ML IJ SOLN
INTRAMUSCULAR | Status: DC | PRN
Start: 2016-02-18 — End: 2016-02-18
  Administered 2016-02-18 (×2): 50 ug via INTRAVENOUS

## 2016-02-18 MED ORDER — PROMETHAZINE HCL 25 MG/ML IJ SOLN: 6.2500 mg | INTRAMUSCULAR | Status: DC | PRN

## 2016-02-18 MED ORDER — TRANEXAMIC ACID 1000 MG/10ML IV SOLN
1000.0000 mg | INTRAVENOUS | Status: DC
  Filled 2016-02-18: qty 10

## 2016-02-18 MED ORDER — ONDANSETRON HCL 4 MG PO TABS: 4.0000 mg | ORAL_TABLET | Freq: Four times a day (QID) | ORAL | Status: DC | PRN

## 2016-02-18 MED ORDER — BISACODYL 5 MG PO TBEC: 5.0000 mg | DELAYED_RELEASE_TABLET | Freq: Every day | ORAL | Status: DC | PRN

## 2016-02-18 MED ORDER — SUCCINYLCHOLINE CHLORIDE 20 MG/ML IJ SOLN
INTRAMUSCULAR | Status: DC | PRN
  Administered 2016-02-18: 120 mg via INTRAVENOUS

## 2016-02-18 MED ORDER — HYDROMORPHONE HCL 1 MG/ML IJ SOLN
1.0000 mg | INTRAMUSCULAR | Status: DC | PRN
  Administered 2016-02-19: 1 mg via INTRAVENOUS
  Filled 2016-02-18: qty 1

## 2016-02-18 MED ORDER — POTASSIUM CITRATE ER 10 MEQ (1080 MG) PO TBCR
20.0000 meq | EXTENDED_RELEASE_TABLET | Freq: Two times a day (BID) | ORAL | Status: DC
  Administered 2016-02-18 – 2016-02-19 (×2): 20 meq via ORAL
  Filled 2016-02-18 (×2): qty 2

## 2016-02-18 MED ORDER — ONDANSETRON HCL 4 MG/2ML IJ SOLN
INTRAMUSCULAR | Status: AC
  Filled 2016-02-18: qty 2

## 2016-02-18 MED ORDER — PANTOPRAZOLE SODIUM 40 MG PO TBEC
40.0000 mg | DELAYED_RELEASE_TABLET | Freq: Two times a day (BID) | ORAL | Status: DC
  Administered 2016-02-18 – 2016-02-19 (×2): 40 mg via ORAL
  Filled 2016-02-18 (×2): qty 1

## 2016-02-18 MED ORDER — BUPIVACAINE-EPINEPHRINE (PF) 0.5% -1:200000 IJ SOLN
INTRAMUSCULAR | Status: DC | PRN
  Administered 2016-02-18: 15 mL via PERINEURAL

## 2016-02-18 MED ORDER — METOCLOPRAMIDE HCL 5 MG PO TABS: 5.0000 mg | ORAL_TABLET | Freq: Three times a day (TID) | ORAL | Status: DC | PRN

## 2016-02-18 MED ORDER — ACETAMINOPHEN 325 MG PO TABS: 650.0000 mg | ORAL_TABLET | Freq: Four times a day (QID) | ORAL | Status: DC | PRN

## 2016-02-18 MED ORDER — LIDOCAINE HCL (CARDIAC) 20 MG/ML IV SOLN
INTRAVENOUS | Status: DC | PRN
  Administered 2016-02-18: 70 mg via INTRAVENOUS

## 2016-02-18 MED ORDER — 0.9 % SODIUM CHLORIDE (POUR BTL) OPTIME
TOPICAL | Status: DC | PRN
  Administered 2016-02-18: 1000 mL

## 2016-02-18 MED ORDER — DOCUSATE SODIUM 100 MG PO CAPS
100.0000 mg | ORAL_CAPSULE | Freq: Two times a day (BID) | ORAL | Status: DC
  Administered 2016-02-18 – 2016-02-19 (×2): 100 mg via ORAL
  Filled 2016-02-18 (×2): qty 1

## 2016-02-18 MED ORDER — HYDROMORPHONE HCL 1 MG/ML IJ SOLN
0.2500 mg | INTRAMUSCULAR | Status: DC | PRN
  Administered 2016-02-18: 0.25 mg via INTRAVENOUS

## 2016-02-18 MED ORDER — PROPOFOL 10 MG/ML IV BOLUS
INTRAVENOUS | Status: AC
  Filled 2016-02-18: qty 20

## 2016-02-18 SURGICAL SUPPLY — 65 items
BASEPLATE GLENOID SHLDR SM (Shoulder) ×2 IMPLANT
BLADE SAW SGTL 83.5X18.5 (BLADE) ×2 IMPLANT
COVER SURGICAL LIGHT HANDLE (MISCELLANEOUS) ×2 IMPLANT
CUP SUT UNIV REVERS 39 +2 (Shoulder) ×2 IMPLANT
DERMABOND ADVANCED (GAUZE/BANDAGES/DRESSINGS) ×1
DERMABOND ADVANCED .7 DNX12 (GAUZE/BANDAGES/DRESSINGS) ×1 IMPLANT
DRAPE ORTHO SPLIT 77X108 STRL (DRAPES) ×2
DRAPE SURG 17X11 SM STRL (DRAPES) ×2 IMPLANT
DRAPE SURG ORHT 6 SPLT 77X108 (DRAPES) ×2 IMPLANT
DRAPE U-SHAPE 47X51 STRL (DRAPES) ×2 IMPLANT
DRESSING AQUACEL AG SP 3.5X10 (GAUZE/BANDAGES/DRESSINGS) ×1 IMPLANT
DRSG AQUACEL AG ADV 3.5X10 (GAUZE/BANDAGES/DRESSINGS) ×2 IMPLANT
DRSG AQUACEL AG SP 3.5X10 (GAUZE/BANDAGES/DRESSINGS) ×2
DURAPREP 26ML APPLICATOR (WOUND CARE) ×2 IMPLANT
ELECT BLADE 4.0 EZ CLEAN MEGAD (MISCELLANEOUS) ×2
ELECT CAUTERY BLADE 6.4 (BLADE) ×2 IMPLANT
ELECT REM PT RETURN 9FT ADLT (ELECTROSURGICAL) ×2
ELECTRODE BLDE 4.0 EZ CLN MEGD (MISCELLANEOUS) ×1 IMPLANT
ELECTRODE REM PT RTRN 9FT ADLT (ELECTROSURGICAL) ×1 IMPLANT
FACESHIELD WRAPAROUND (MASK) ×6 IMPLANT
GLENOSPHERE LAT 39+4 SHOULDER (Shoulder) ×2 IMPLANT
GLOVE BIO SURGEON STRL SZ7.5 (GLOVE) ×2 IMPLANT
GLOVE BIO SURGEON STRL SZ8 (GLOVE) ×2 IMPLANT
GLOVE EUDERMIC 7 POWDERFREE (GLOVE) ×2 IMPLANT
GLOVE SS BIOGEL STRL SZ 7.5 (GLOVE) ×1 IMPLANT
GLOVE SUPERSENSE BIOGEL SZ 7.5 (GLOVE) ×1
GOWN STRL REUS W/ TWL LRG LVL3 (GOWN DISPOSABLE) IMPLANT
GOWN STRL REUS W/ TWL XL LVL3 (GOWN DISPOSABLE) ×2 IMPLANT
GOWN STRL REUS W/TWL LRG LVL3 (GOWN DISPOSABLE)
GOWN STRL REUS W/TWL XL LVL3 (GOWN DISPOSABLE) ×2
INSERT HUMERAL MED 39/ +3 (Shoulder) ×1 IMPLANT
INSERT MEDIUM HUMERAL 39/ +3 (Shoulder) ×1 IMPLANT
KIT BASIN OR (CUSTOM PROCEDURE TRAY) ×2 IMPLANT
KIT ROOM TURNOVER OR (KITS) ×2 IMPLANT
MANIFOLD NEPTUNE II (INSTRUMENTS) ×2 IMPLANT
NEEDLE 1/2 CIR CATGUT .05X1.09 (NEEDLE) ×2 IMPLANT
NEEDLE HYPO 25GX1X1/2 BEV (NEEDLE) IMPLANT
NS IRRIG 1000ML POUR BTL (IV SOLUTION) ×2 IMPLANT
PACK SHOULDER (CUSTOM PROCEDURE TRAY) ×2 IMPLANT
PAD ARMBOARD 7.5X6 YLW CONV (MISCELLANEOUS) ×4 IMPLANT
PASSER SUT SWANSON 36MM LOOP (INSTRUMENTS) IMPLANT
RESTRAINT HEAD UNIVERSAL NS (MISCELLANEOUS) ×2 IMPLANT
SCREW CENTRAL NONLOCK 6.5X20MM (Shoulder) ×2 IMPLANT
SCREW LOCK GLENOID UNI PERI (Screw) ×2 IMPLANT
SCREW LOCK PERIPHERAL 30MM (Shoulder) ×2 IMPLANT
SET PIN UNIVERSAL REVERSE (SET/KITS/TRAYS/PACK) ×2 IMPLANT
SLING ARM FOAM STRAP LRG (SOFTGOODS) ×2 IMPLANT
SPONGE LAP 18X18 X RAY DECT (DISPOSABLE) ×2 IMPLANT
SPONGE LAP 4X18 X RAY DECT (DISPOSABLE) ×2 IMPLANT
STEM REVERSE SIZE8 (Stem) ×2 IMPLANT
SUCTION FRAZIER HANDLE 10FR (MISCELLANEOUS) ×1
SUCTION TUBE FRAZIER 10FR DISP (MISCELLANEOUS) ×1 IMPLANT
SUT BONE WAX W31G (SUTURE) ×2 IMPLANT
SUT FIBERWIRE #2 38 T-5 BLUE (SUTURE) ×4
SUT MNCRL AB 3-0 PS2 18 (SUTURE) ×2 IMPLANT
SUT MON AB 2-0 CT1 36 (SUTURE) ×2 IMPLANT
SUT VIC AB 1 CT1 27 (SUTURE) ×1
SUT VIC AB 1 CT1 27XBRD ANBCTR (SUTURE) ×1 IMPLANT
SUT VIC AB 2-0 CT1 27 (SUTURE)
SUT VIC AB 2-0 CT1 TAPERPNT 27 (SUTURE) IMPLANT
SUTURE FIBERWR #2 38 T-5 BLUE (SUTURE) ×2 IMPLANT
SYR CONTROL 10ML LL (SYRINGE) IMPLANT
TOWEL OR 17X24 6PK STRL BLUE (TOWEL DISPOSABLE) ×2 IMPLANT
TOWEL OR 17X26 10 PK STRL BLUE (TOWEL DISPOSABLE) ×2 IMPLANT
WATER STERILE IRR 1000ML POUR (IV SOLUTION) IMPLANT

## 2016-02-18 NOTE — H&P (Signed)
[facility]    Chief Complaint: RIGHT SHOULDER ROTATOR TEAR ARTHROPATHY HPI: The patient is a 70 y.o. male with chronic and progressively increasing right shoulder pain secondary to end stage rotator cuff tear arthropathy  Past Medical History  Diagnosis Date  . Kidney stones   . High cholesterol   . BPH (benign prostatic hyperplasia)   . Enlarged prostate   . Migraine headache   . IBS (irritable bowel syndrome)   . Kidney stone   . Central retinal vein occlusion of left eye     (Left) Dr. Guy Beginobert De[facility]zo [facility]); Dr. Memory Argueraig Grevin [facility]   . Amaurosis fugax of left eye   . Retinal detachment 2005  . Macular hole of left eye 2005  . H. pylori infection 02/14/2013  . Sleep apnea     cpap  . Hypertension   . Anemia     Past Surgical History  Procedure Laterality Date  . Retinal detachment surgery    . Hernia repair    . Kidney stone surgery    . Lithotripsy      Family History  Problem Relation Age of Onset  . Heart disease Mother   . Hypertension Mother   . Hyperlipidemia Mother   . Heart attack Mother   . Heart disease Father   . Alzheimer's disease Father   . Diabetes Neg Hx     Social History:  reports that he has never smoked. He has never used smokeless tobacco. He reports that he drinks alcohol. He reports that he does not use illicit drugs.   Medications Prior to Admission  Medication Sig Dispense Refill  . amoxicillin (AMOXIL) 500 MG capsule Take 2,000 mg by mouth See admin instructions. Take before dental procedures    . aspirin 81 MG tablet Take 81 mg by mouth daily.    . Calcium Citrate-Vitamin D (CALCIUM + D PO) Take 1 tablet by mouth daily.    . diclofenac sodium (VOLTAREN) 1 % GEL Apply 1 application topically 3 (three) times daily as needed.     . ferrous sulfate 325 (65 FE) MG tablet Take 325 mg by mouth daily with breakfast.    . folic acid (FOLVITE) 400 MCG tablet Take 400 mcg by mouth daily.    . mometasone (NASONEX) 50 MCG/ACT nasal  spray Place 2 sprays into the nose daily.    . Multiple Vitamins-Minerals (MULTIVITAMIN MEN PO) Take 1 tablet by mouth daily.    . pantoprazole (PROTONIX) 40 MG tablet Take 40 mg by mouth 2 (two) times daily before a meal.     . potassium citrate (UROCIT-K) 10 MEQ (1080 MG) SR tablet Take 20 mEq by mouth 2 (two) times daily.    . pravastatin (PRAVACHOL) 80 MG tablet Take 1 tablet (80 mg total) by mouth at bedtime. 90 tablet 3  . ranitidine (ZANTAC) 150 MG capsule Take 150 mg by mouth at bedtime as needed for heartburn.    . telmisartan-hydrochlorothiazide (MICARDIS HCT) 40-12.5 MG tablet Take 1 tablet by mouth every morning.  11  . HYDROcodone-homatropine (HYCODAN) 5-1.5 MG/5ML syrup Take 1-2 teaspoons at night for cough 240 mL 0  . hydrOXYzine (ATARAX/VISTARIL) 25 MG tablet Take 1 tablet (25 mg total) by mouth every 4 (four) hours as needed for itching. 60 tablet 1     Physical Exam: right shoulder with severe pain and restricted mobility as noted at recent office visit  Vitals  Temp:  [98.3 F (36.8 C)] 98.3 F (36.8 C) (06/29 16100619) Pulse  Rate:  [60] 60 (06/29 0619) Resp:  [20] 20 (06/29 0619) BP: (120)/(69) 120/69 mmHg (06/29 0619) SpO2:  [98 %] 98 % (06/29 0619)  Assessment/Plan  Impression: RIGHT SHOULDER ROTATOR TEAR ARTHROPATHY  Plan of Action: Procedure(s): RIGHT REVERSE SHOULDER ARTHROPLASTY  Brinlyn Cena M Victorian Gunn 02/18/2016, 7:23 AM Contact # [phone]

## 2016-02-18 NOTE — Anesthesia Procedure Notes (Addendum)
Anesthesia Regional Block:  Interscalene brachial plexus block  Pre-Anesthetic Checklist: ,, timeout performed, Correct Patient, Correct Site, Correct Laterality, Correct Procedure, Correct Position, site marked, Risks and benefits discussed,  Surgical consent,  Pre-op evaluation,  At surgeon's request and post-op pain management  Laterality: Right  Prep: chloraprep       Needles:  Injection technique: Single-shot  Needle Type: Echogenic Needle     Needle Length: 9cm 9 cm Needle Gauge: 21 and 21 G    Additional Needles:  Procedures: ultrasound guided (picture in chart) Interscalene brachial plexus block Narrative:  Start time: 02/18/2016 7:20 AM End time: 02/18/2016 7:25 AM Injection made incrementally with aspirations every 5 mL.  Performed by: Personally  Anesthesiologist: Shona SimpsonHOLLIS, KEVIN D  Additional Notes: No immediate complications noted. Pt tolerated well.    Procedure Name: Intubation Date/Time: 02/18/2016 7:46 AM Performed by: Lovie CholOCK, Arta Stump K Pre-anesthesia Checklist: Patient identified, Emergency Drugs available, Suction available and Patient being monitored Patient Re-evaluated:Patient Re-evaluated prior to inductionOxygen Delivery Method: Circle System Utilized Preoxygenation: Pre-oxygenation with 100% oxygen Intubation Type: IV induction Ventilation: Mask ventilation without difficulty Grade View: Grade I Tube type: Oral Tube size: 7.5 mm Number of attempts: 1 Airway Equipment and Method: Stylet,  Oral airway and Video-laryngoscopy Placement Confirmation: ETT inserted through vocal cords under direct vision,  positive ETCO2 and breath sounds checked- equal and bilateral Secured at: 22 cm Tube secured with: Tape Dental Injury: Teeth and Oropharynx as per pre-operative assessment  Difficulty Due To: Difficulty was anticipated, Difficult Airway- due to large tongue, Difficult Airway- due to reduced neck mobility and Difficult Airway- due to limited oral  opening Future Recommendations: Recommend- induction with short-acting agent, and alternative techniques readily available

## 2016-02-18 NOTE — Anesthesia Postprocedure Evaluation (Signed)
Anesthesia Post Note  Patient: Jon Bush  Procedure(s) Performed: Procedure(s) (LRB): RIGHT REVERSE SHOULDER ARTHROPLASTY (Right)  Patient location during evaluation: PACU Anesthesia Type: General and Regional Level of consciousness: awake and alert Pain management: pain level controlled Vital Signs Assessment: post-procedure vital signs reviewed and stable Respiratory status: spontaneous breathing, nonlabored ventilation, respiratory function stable and patient connected to nasal cannula oxygen Cardiovascular status: blood pressure returned to baseline and stable Postop Assessment: no signs of nausea or vomiting Anesthetic complications: no    Last Vitals:  Filed Vitals:   02/18/16 1025 02/18/16 1030  BP: 139/84   Pulse: 69 70  Temp:    Resp: 14 17    Last Pain:  Filed Vitals:   02/18/16 1034  PainSc: 5                  Shelton SilvasKevin D Kimsey Demaree

## 2016-02-18 NOTE — Op Note (Signed)
02/18/2016  9:21 AM  PATIENT:   Jon Bush  70 y.o. male  PRE-OPERATIVE DIAGNOSIS:  RIGHT SHOULDER ROTATOR TEAR ARTHROPATHY  POST-OPERATIVE DIAGNOSIS:  same  PROCEDURE:  Right Reverse Shoulder Arthroplasty #8 stem, +3 poly, 39/+4 glenosphere  SURGEON:  Quandarius Nill, Vania ReaKevin M. M.D.  ASSISTANTS: Shuford pac   ANESTHESIA:   GET + ISB  EBL: 200  SPECIMEN:  none  Drains: none   PATIENT DISPOSITION:  PACU - hemodynamically stable.    PLAN OF CARE: Admit for overnight observation  Dictation# [id number]   Contact # [phone]

## 2016-02-18 NOTE — Transfer of Care (Signed)
Immediate Anesthesia Transfer of Care Note  Patient: Jon Bush  Procedure(s) Performed: Procedure(s): RIGHT REVERSE SHOULDER ARTHROPLASTY (Right)  Patient Location: PACU  Anesthesia Type:General  Level of Consciousness: awake, oriented and patient cooperative  Airway & Oxygen Therapy: Patient Spontanous Breathing and Patient connected to face mask oxygen  Post-op Assessment: Report given to RN and Post -op Vital signs reviewed and stable  Post vital signs: Reviewed  Last Vitals:  Filed Vitals:   02/18/16 0619  BP: 120/69  Pulse: 60  Temp: 36.8 C  Resp: 20    Last Pain: There were no vitals filed for this visit.    Patients Stated Pain Goal: 3 (02/18/16 [id number])  Complications: No apparent anesthesia complications

## 2016-02-19 ENCOUNTER — Encounter (HOSPITAL_COMMUNITY): Payer: Self-pay | Admitting: Orthopedic Surgery

## 2016-02-19 MED ORDER — OXYCODONE-ACETAMINOPHEN 5-325 MG PO TABS: 1.0000 | ORAL_TABLET | ORAL | Status: DC | PRN

## 2016-02-19 MED ORDER — HYDROMORPHONE HCL 2 MG PO TABS: 2.0000 mg | ORAL_TABLET | ORAL | Status: DC | PRN

## 2016-02-19 MED ORDER — KETOROLAC TROMETHAMINE 15 MG/ML IJ SOLN
15.0000 mg | Freq: Once | INTRAMUSCULAR | Status: AC
  Administered 2016-02-19: 15 mg via INTRAVENOUS
  Filled 2016-02-19: qty 1

## 2016-02-19 MED ORDER — DIAZEPAM 5 MG PO TABS: 2.5000 mg | ORAL_TABLET | Freq: Four times a day (QID) | ORAL | Status: DC | PRN

## 2016-02-19 NOTE — Progress Notes (Signed)
Set up/placed pt. on CPAP, Auto-mode, with nasal mask and 2 lpm Oxygen added into circuit, tolerating well, RT to monitor.

## 2016-02-19 NOTE — Discharge Instructions (Signed)

## 2016-02-19 NOTE — Progress Notes (Signed)
Pt ready to be discharged to home with daughter. Pt. Is alert and oriented. IV removed. Pt is hemodynamically stable. AVS reviewed with pt. Capable of re verbalizing medication regimen. Discharge plan appropriate and in place.

## 2016-02-19 NOTE — Discharge Summary (Signed)
PATIENT ID:      Jon Bush  MRN:     [id number] DOB/AGE:    23-Dec-1945 / 70 y.o.     DISCHARGE SUMMARY  ADMISSION DATE:    02/18/2016 DISCHARGE DATE:    ADMISSION DIAGNOSIS: RIGHT SHOULDER ROTATOR TEAR ARTHROPATHY Past Medical History  Diagnosis Date  . Kidney stones   . High cholesterol   . BPH (benign prostatic hyperplasia)   . Enlarged prostate   . Migraine headache   . IBS (irritable bowel syndrome)   . Kidney stone   . Central retinal vein occlusion of left eye     (Left) Dr. Guy [facility]); Dr. Memory [facility])   . Amaurosis fugax of left eye   . Retinal detachment 2005  . Macular hole of left eye 2005  . H. pylori infection 02/14/2013  . Sleep apnea     cpap  . Hypertension   . Anemia     DISCHARGE DIAGNOSIS:   Active Problems:   S/p reverse total shoulder arthroplasty   PROCEDURE: Procedure(s): RIGHT REVERSE SHOULDER ARTHROPLASTY on 02/18/2016  CONSULTS:     HISTORY:  See H&P in chart.  HOSPITAL COURSE:  Jon Bush is a 70 y.o. admitted on 02/18/2016 with a diagnosis of RIGHT SHOULDER ROTATOR TEAR ARTHROPATHY.  They were brought to the operating room on 02/18/2016 and underwent Procedure(s): RIGHT REVERSE SHOULDER ARTHROPLASTY.    They were given perioperative antibiotics: Anti-infectives    Start     Dose/Rate Route Frequency Ordered Stop   02/18/16 1400  ceFAZolin (ANCEF) IVPB 2g/100 mL premix     2 g 200 mL/hr over 30 Minutes Intravenous Every 6 hours 02/18/16 1212 02/19/16 0143   02/18/16 0600  ceFAZolin (ANCEF) IVPB 2g/100 mL premix     2 g 200 mL/hr over 30 Minutes Intravenous To ShortStay Surgical 02/17/16 1334 02/18/16 0747    .  Patient underwent the above named procedure and tolerated it well. He had a rough night with pain control when the block wore off and mechanical difficulties with his bed controls.  The following day they were hemodynamically stable and pain was controlled on oral analgesics. They were  neurovascularly intact to the operative extremity. OT and PT was ordered and worked with patient per protocol. They were medically and orthopaedically stable for discharge on day 1 with home health services.    DIAGNOSTIC STUDIES:  RECENT RADIOGRAPHIC STUDIES :  No results found.  RECENT VITAL SIGNS:  Patient Vitals for the past 24 hrs:  BP Temp Temp src Pulse Resp SpO2  02/19/16 0441 (!) 147/87 mmHg 98.8 F (37.1 C) Oral (!) 102 16 98 %  02/19/16 0010 (!) 155/82 mmHg 98.7 F (37.1 C) Oral (!) 103 18 99 %  02/18/16 2125 134/85 mmHg 99.2 F (37.3 C) Oral 98 16 99 %  02/18/16 1500 (!) 130/95 mmHg 98.6 F (37 C) Oral 80 16 97 %  02/18/16 1200 - 98.1 F (36.7 C) - 67 15 100 %  02/18/16 1148 140/83 mmHg - - - - -  02/18/16 1145 - - - (!) 33 12 100 %  02/18/16 1133 (!) 151/71 mmHg - - - - -  02/18/16 1130 - - - (!) 54 12 100 %  02/18/16 1118 (!) 142/88 mmHg - - - - -  02/18/16 1115 - - - (!) 58 14 100 %  02/18/16 1102 (!) 152/84 mmHg - - - - -  02/18/16 1100 - - - (!) 59 13 99 %  02/18/16 1048 (!) 150/84 mmHg - - - - -  02/18/16 1045 - - - 65 15 93 %  02/18/16 1034 (!) 147/84 mmHg - - - - -  02/18/16 1030 - - - 70 17 100 %  02/18/16 1025 139/84 mmHg - - 69 14 100 %  02/18/16 1015 - - - 73 13 100 %  02/18/16 1002 (!) 114/94 mmHg - - 79 14 99 %  02/18/16 1000 - - - 80 18 99 %  02/18/16 0945 139/79 mmHg 97.5 F (36.4 C) - 82 13 100 %  .  RECENT EKG RESULTS:    Orders placed or performed during the hospital encounter of 02/18/16  . EKG 12-Lead  . EKG 12-Lead    DISCHARGE INSTRUCTIONS:  Discharge Instructions    Discontinue IV    Complete by:  As directed            DISCHARGE MEDICATIONS:     Medication List    STOP taking these medications        HYDROcodone-homatropine 5-1.5 MG/5ML syrup  Commonly known as:  HYCODAN      TAKE these medications        amoxicillin 500 MG capsule  Commonly known as:  AMOXIL  Take 2,000 mg by mouth See admin instructions. Take  before dental procedures     aspirin 81 MG tablet  Take 81 mg by mouth daily.     CALCIUM + D PO  Take 1 tablet by mouth daily.     diazepam 5 MG tablet  Commonly known as:  VALIUM  Take 0.5-1 tablets (2.5-5 mg total) by mouth every 6 (six) hours as needed for muscle spasms or sedation.     diclofenac sodium 1 % Gel  Commonly known as:  VOLTAREN  Apply 1 application topically 3 (three) times daily as needed.     ferrous sulfate 325 (65 FE) MG tablet  Take 325 mg by mouth daily with breakfast.     folic acid 400 MCG tablet  Commonly known as:  FOLVITE  Take 400 mcg by mouth daily.     HYDROmorphone 2 MG tablet  Commonly known as:  DILAUDID  Take 1-2 tablets (2-4 mg total) by mouth every 4 (four) hours as needed (for severe pain not controlled by percocet).     hydrOXYzine 25 MG tablet  Commonly known as:  ATARAX/VISTARIL  Take 1 tablet (25 mg total) by mouth every 4 (four) hours as needed for itching.     mometasone 50 MCG/ACT nasal spray  Commonly known as:  NASONEX  Place 2 sprays into the nose daily.     MULTIVITAMIN MEN PO  Take 1 tablet by mouth daily.     oxyCODONE-acetaminophen 5-325 MG tablet  Commonly known as:  PERCOCET  Take 1-2 tablets by mouth every 4 (four) hours as needed.     pantoprazole 40 MG tablet  Commonly known as:  PROTONIX  Take 40 mg by mouth 2 (two) times daily before a meal.     potassium citrate 10 MEQ (1080 MG) SR tablet  Commonly known as:  UROCIT-K  Take 20 mEq by mouth 2 (two) times daily.     pravastatin 80 MG tablet  Commonly known as:  PRAVACHOL  Take 1 tablet (80 mg total) by mouth at bedtime.     ranitidine 150 MG capsule  Commonly known as:  ZANTAC  Take 150 mg by mouth at bedtime as needed for heartburn.  telmisartan-hydrochlorothiazide 40-12.5 MG tablet  Commonly known as:  MICARDIS HCT  Take 1 tablet by mouth every morning.        FOLLOW UP VISIT:       Follow-up Information    Follow up with Vania ReaKEVIN M  SUPPLE, MD.   Specialty:  Orthopedic Surgery   Why:  call to be seen in 10-14 days   Contact information:   [address] [place] [address] [phone]       DISCHARGE TO: Home  DISPOSITION: Good  DISCHARGE CONDITION:  Rodolph BongGood   Minta Fair for Dr. Francena HanlyKevin Supple 02/19/2016, 8:38 AM

## 2016-02-19 NOTE — Evaluation (Signed)
Occupational Therapy Evaluation Patient Details Name: Jon EmeryRichard Bush MRN: [id number] DOB: Jan 21, 1946 Today's Date: 02/19/2016    History of Present Illness R reverse TSA   Clinical Impression   Pt requires min A for UB and LB ADLs due to R UE immobilization in sling following revers TSA. Pt's daughter present and will be staying with him for 2 weeks for assist prn. Pt and his daughter educated on shoulder/sling protocol, proper postioning, ADL techniques and ROM exercises as ordered per MD. Pt moving well and able to get in and out of bed Mod I, ambulate to bathroom Mod I, transfer to and from toilet Mod I. No balance impairments observed. All education completed and no further acute OT indicated at this time    Follow Up Recommendations  Other (comment);Home health OT (progress rehab of shoulder as ordered by MD when appropriate)    Equipment Recommendations  None recommended by OT    Recommendations for Other Services       Precautions / Restrictions Precautions Precautions: Shoulder Shoulder Interventions: Off for dressing/bathing/exercises;At all times;Shoulder sling/immobilizer Precaution Booklet Issued: Yes (comment) Precaution Comments: Educated pt and his daughter on sling/shoulder protocol per MD, ROM exercies prescribed per MD Required Braces or Orthoses: Sling Restrictions Weight Bearing Restrictions: Yes RUE Weight Bearing: Non weight bearing  Per MD, pt may remove sling to give neck a break     Mobility Bed Mobility Overal bed mobility: Modified Independent             General bed mobility comments: used rails with HOB raised  Transfers Overall transfer level: Modified independent Equipment used: None             General transfer comment: slow pace of speed with guarding    Balance Overall balance assessment: No apparent balance deficits (not formally assessed)                                          ADL Overall ADL's  : Needs assistance/impaired     Grooming: Wash/dry hands;Wash/dry face;Supervision/safety;Set up;Standing   Upper Body Bathing: Minimal assitance   Lower Body Bathing: Minimal assistance   Upper Body Dressing : Minimal assistance   Lower Body Dressing: Minimal assistance   Toilet Transfer: Modified Independent;Regular Toilet;Grab bars;Ambulation   Toileting- Clothing Manipulation and Hygiene: Supervision/safety   Tub/ Shower Transfer: Minimal assistance;3 in 1;Ambulation   Functional mobility during ADLs:  (slow pace of speed with guarding) General ADL Comments: educated pt and his daughter on bathing and dressing techniques     Vision  wears reading glasses, no change from baseline              Pertinent Vitals/Pain Pain Assessment: 0-10 Pain Score: 3  Pain Location: R shoulder Pain Descriptors / Indicators: Aching;Sore;Guarding Pain Intervention(s): Ice applied;Monitored during session;Premedicated before session;Repositioned     Hand Dominance Right   Extremity/Trunk Assessment Upper Extremity Assessment Upper Extremity Assessment: RUE deficits/detail RUE Deficits / Details: R UE in sling. Hand and wrist ROM WNL. Elbow ROM decreased due to pain/stiffness RUE: Unable to fully assess due to immobilization       Cervical / Trunk Assessment Cervical / Trunk Assessment: Normal   Communication Communication Communication: No difficulties;HOH (has hearing aids)   Cognition Arousal/Alertness: Lethargic;Suspect due to medications Behavior During Therapy: [facility] for tasks assessed/performed Overall Cognitive Status: Within Functional Limits for tasks assessed  General Comments   Pt very pleasant and cooperative, daughter very supportive    Exercises  R shoulder FF, Abduction R hand, wrist, elbow ROM to tolerance     Shoulder Instructions Shoulder Instructions Donning/doffing shirt without moving shoulder: Minimal assistance;Caregiver  independent with task;Patient able to independently direct caregiver Method for sponge bathing under operated UE: Minimal assistance;Caregiver independent with task;Patient able to independently direct caregiver Donning/doffing sling/immobilizer: Caregiver independent with task;Patient able to independently direct caregiver Correct positioning of sling/immobilizer: Supervision/safety;Caregiver independent with task;Patient able to independently direct caregiver ROM for elbow, wrist and digits of operated UE: Supervision/safety;Caregiver independent with task;Patient able to independently direct caregiver Sling wearing schedule (on at all times/off for ADL's): Supervision/safety;Caregiver independent with task;Patient able to independently direct caregiver Proper positioning of operated UE when showering: Supervision/safety;Caregiver independent with task;Patient able to independently direct caregiver Positioning of UE while sleeping: Supervision/safety;Caregiver independent with task;Patient able to independently direct caregiver    Home Living Family/patient expects to be discharged to:: Private residence Living Arrangements: Alone Available Help at Discharge: Family (daughter will stay with for 2 weeks prn) Type of Home: House Home Access: Level entry     Home Layout: One level     Bathroom Shower/Tub: Producer, television/film/videoWalk-in shower   Bathroom Toilet: Handicapped height Bathroom Accessibility: Yes   Home Equipment: None          Prior Functioning/Environment Level of Independence: Independent             OT Diagnosis: Acute pain   OT Problem List: Impaired UE functional use;Pain;Decreased activity tolerance         OT Goals(Current goals can be found in the care plan section) Acute Rehab OT Goals Patient Stated Goal: get some rest and go home       Barriers to D/C:    none, pt's dauhgter will stay with him for 2 weeks prn       Co-evaluation              End of Session  Nurse Communication: Mobility status  Activity Tolerance: Patient tolerated treatment well Patient left: in bed;with call bell/phone within reach;with family/visitor present   Time: [id number] OT Time Calculation (min): 56 min Charges:  OT General Charges $OT Visit: 1 Procedure OT Evaluation $OT Eval Moderate Complexity: 1 Procedure OT Treatments $Self Care/Home Management : 8-22 mins $Therapeutic Activity: 8-22 mins $Therapeutic Exercise: 8-22 mins G-Codes:    Galen ManilaSpencer, Dell Briner Jeanette 02/19/2016, 10:41 AM

## 2016-02-19 NOTE — Progress Notes (Signed)
Spoke w da. Pt sleeping. Da to stay w pt for about 1 wk post disch. [facility] and no pref. Made ref to tiffany w [facility] for hhpt and ot,pt for dc home today.

## 2016-02-19 NOTE — Op Note (Signed)
NAME:  Jon Bush, Jon Bush          ACCOUNT NO.:  [id number]  MEDICAL RECORD NO.:  [id number]  LOCATION:  [id number]                        FACILITY:  [facility]  PHYSICIAN:  Vania ReaKevin M. Kiandria Clum, M.D.  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  PROCEDURE:  A right reverse shoulder arthroplasty utilizing a press-fit size 8 Arthrex stem with a +3 polyethylene insert and a 39+ 4 glenosphere and a small baseplate.  SURGEON:  Vania ReaKevin M. Nicolet Griffy, M.D.  Threasa HeadsASSISTANFrench Ana:  Tracy A. Shuford, P.A.-C.  ANESTHESIA:  General endotracheal as well as an interscalene block.  ESTIMATED BLOOD LOSS:  200 mL.  DRAINS:  None.  HISTORY:  Mr. Jon Bush is a 70 year old gentleman who has had chronic and progressive increasing right shoulder pain with loss of motion, increasing functional limitations related to end-stage rotator cuff tear arthropathy.  Due to his increasing pain and functional limitations, he was brought to the operating room at this time for planned right reverse shoulder arthroplasty as described below.  Preoperatively, I counseled Mr. Jon Bush regarding treatment options and potential risks versus benefits thereof.  Possible surgical complications were reviewed including bleeding, infection, neurovascular injury, persistent pain, loss of motion, anesthetic complication, failure of the implant and possible need for additional surgery.  He understands and accepts and agrees with our planned procedure.  PROCEDURE IN DETAIL:  After undergoing routine preop evaluation, the patient received prophylactic antibiotics and an interscalene block was established in the holding area by the Anesthesia Department.  Placed supine on the operating table, underwent smooth  induction of a general endotracheal anesthesia.  Placed in the beach-chair position and appropriately padded and protected.  The right shoulder girdle region was sterilely prepped and draped in standard fashion.  Time-out was called.  An anterior deltopectoral approach to the right shoulder was made through a 10-cm incision.  Skin flaps were elevated and electrocautery was used for hemostasis.  Dissection was carried deeply. Cephalic vein was identified and retracted laterally the deltoid.  Pec major retracted medially.  Adhesion was then divided beneath the deltoid.  The upper cm of the pec major was tenotomized to enhance exposure.  We then identified and mobilized the conjoined tendon, which was retracted medially.  Biceps tendon was then tenotomized for later tenodesis.  The subscapularis was then divided away from the lesser tuberosity and the free margin was tagged with a pair of #2 FiberWire sutures in a grasping technique.  The circumflex vessels were coagulated and capsular tissue was divided from the anteroinferior and inferior aspects of the humeral neck allowing complete delivery of the humeral head through the wound.  We then utilized our oscillating saw to create a humeral head resection at approximately 20 degrees of retroversion at the 135 degrees angle using the extramedullary guide to confirm proper orientation.  At this point, we placed a metal cap over the cut surface of the metaphysis of the proximal humerus and then exposed the glenoid with combination of Fukuda, pitchfork, and snake tongue retractors.  I performed a  circumferential labral resection.  Gained complete visualization of the margins of the glenoid.  We also mobilized the subscapularis, such that had excellent elasticity.  Once we gained exposure of the glenoid, I placed a guidepin in the center of the glenoid and we reamed this with the small reamer creating our central pilot hole and peripheral reaming  to subchondral bony bed.  We then impacted the small baseplate, placed our central lag screw in the superior and inferior locking screws, all of which obtained excellent bony purchase and fixation.  We then used the peripheral reamer around the glenoid baseplate.  I removed residual soft tissue and at this point, then I selected the 39+ 4 glenosphere and we placed the glenosphere onto the [facility] taper of the glenoid baseplate.  This was impacted and then we confirmed excellent stability and fixation.  At this point, we then returned our attention to the proximal humerus where we performed initial hand reaming of the canal to size 8.  We broached up to size 8 and maintaining approximately 15-20 degrees of retroversion.  We then reamed the metaphysis of the proximal humerus and then placed our trial stem, performed the reduction, which showed good soft tissue balance.  We then removed our trial, the canal was irrigated.  We assembled the final size 8 stem with the 39 metaphysis. With this, it was assembled on the back table.  Then, we implanted the humeral stem and impacted this with excellent fit and fixation.  We then again performed trial reduction, which showed good soft tissue balance, good stability and excellent motion.  At this point, then we removed the trial polyethylene insert, the final +3 poly was impacted into place. We performed our final reduction, again showed excellent soft tissue balance, good stability and good motion.  The joint was then copiously irrigated.  Hemostasis was obtained.  We repaired the subscapularis back through the eyelet holes on the margins of our humeral metaphysis implant.  We then performed a tenodesis of the biceps tendon, the upper margin of the pectoralis major.  Again, the wound was irrigated, hemostasis was once again obtained.  The wound was then closed with a series of #1 figure-of-eight Vicryl for the deltopectoral interval, 2-0 Vicryl for  subcu layer, intracuticular 3-0 Monocryl for the skin followed by Dermabond and Aquacel dressing.  Right arm was placed in a sling.  The patient was awakened, extubated and taken to the recovery room in stable condition.  Ralene Batheracy Shuford, PA-C was used as an Geophysicist/field seismologistassistant throughout this case, was essential for help with positioning the patient, positioning the extremity, management of the retractors, implantation of the prosthesis, wound closure and intraoperative decision making.     Vania ReaKevin M. Cherolyn Behrle, M.D.     KMS/MEDQ  D:  02/18/2016  T:  02/19/2016  Job:  [id number]

## 2016-03-14 ENCOUNTER — Ambulatory Visit: Payer: Medicare Other | Attending: Orthopedic Surgery | Admitting: Physical Therapy

## 2016-03-14 DIAGNOSIS — M25611 Stiffness of right shoulder, not elsewhere classified: Secondary | ICD-10-CM | POA: Diagnosis present

## 2016-03-14 DIAGNOSIS — M25511 Pain in right shoulder: Secondary | ICD-10-CM | POA: Insufficient documentation

## 2016-03-14 DIAGNOSIS — R293 Abnormal posture: Secondary | ICD-10-CM | POA: Diagnosis present

## 2016-03-14 NOTE — Therapy (Signed)
[facility] [address]  [address] [place], [place], [address] Phone: [phone]   Fax:  [phone]  Physical Therapy Evaluation  Patient Details  Name: Jon Bush MRN: [id number] Date of Birth: 06-06-1946 Referring Provider: Dr. Francena Hanly  Encounter Date: 03/14/2016      PT End of Session - 03/14/16 1250    Visit Number 1   Number of Visits 16   Date for PT Re-Evaluation 05/09/16   PT Start Time 1028   PT Stop Time 1108   PT Time Calculation (min) 40 min   Activity Tolerance Patient tolerated treatment well   Behavior During Therapy [facility] for tasks assessed/performed      Past Medical History:  Diagnosis Date  . Amaurosis fugax of left eye   . Anemia   . BPH (benign prostatic hyperplasia)   . Central retinal vein occlusion of left eye    (Left) Dr. Guy Begin [facility]); Dr. Memory Argue [facility])   . Enlarged prostate   . H. pylori infection 02/14/2013  . High cholesterol   . Hypertension   . IBS (irritable bowel syndrome)   . Kidney stone   . Kidney stones   . Macular hole of left eye 2005  . Migraine headache   . Retinal detachment 2005  . Sleep apnea    cpap    Past Surgical History:  Procedure Laterality Date  . HERNIA REPAIR    . KIDNEY STONE SURGERY    . LITHOTRIPSY    . RETINAL DETACHMENT SURGERY    . REVERSE SHOULDER ARTHROPLASTY Right 02/18/2016  . REVERSE SHOULDER ARTHROPLASTY Right 02/18/2016   Procedure: RIGHT REVERSE SHOULDER ARTHROPLASTY;  Surgeon: Francena Hanly, MD;  Location: [facility] OR;  Service: Orthopedics;  Laterality: Right;    There were no vitals filed for this visit.       Subjective Assessment - 03/14/16 1030    Subjective Pt is a 70 y/o male who presents to OPPT s/p R shoulder reverse total shoulder arthroplasty on 02/18/16.  Pt had HHOT x 2 weeks and presents today with decreased function in R UE.   Limitations Lifting;House hold activities   Patient Stated Goals  regain UE use as much as possible; decrease pain   Currently in Pain? Yes   Pain Score 2    Pain Location Shoulder   Pain Orientation Right   Pain Descriptors / Indicators Aching   Pain Type Surgical pain   Pain Onset 1 to 4 weeks ago   Pain Frequency Intermittent   Aggravating Factors  activity   Pain Relieving Factors rest, immobilization, support            [facility] PT Assessment - 03/14/16 1034      Assessment   Medical Diagnosis R RSA   Referring Provider Dr. Francena Hanly   Onset Date/Surgical Date 02/18/16   Next MD Visit 1 month     Precautions   Precaution Comments follow protocol     Balance Screen   Has the patient fallen in the past 6 months No   Has the patient had a decrease in activity level because of a fear of falling?  No   Is the patient reluctant to leave their home because of a fear of falling?  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   Leisure play with grandchildren (11 and 15 y/o)     Cognition   Overall Cognitive Status Within Functional Limits for tasks assessed     Observation/Other Assessments   Focus on Therapeutic Outcomes (FOTO)  32 (68% limited; predicted 37% limited)     Posture/Postural Control   Posture/Postural Control Postural limitations   Postural Limitations Rounded Shoulders;Forward head   Posture Comments R shoulder elevated     AROM   Overall AROM Comments all measured supine   AROM Assessment Site Shoulder   Right Shoulder Flexion 73 Degrees   Right Shoulder ABduction 50 Degrees   Right Shoulder Internal Rotation 50 Degrees   Right Shoulder External Rotation 14 Degrees     PROM   Overall PROM Comments all measured supine   PROM Assessment Site  Shoulder   Right Shoulder Flexion 85 Degrees   Right Shoulder ABduction 56 Degrees   Right Shoulder Internal Rotation 50 Degrees  limited by inability to abduct further to measure   Right Shoulder External Rotation 30 Degrees     Strength   Overall Strength Comments deferred at this time   Strength Assessment Site Elbow   Right/Left Elbow Right                   [facility] Adult PT Treatment/Exercise - 03/14/16 1034      Exercises   Exercises Shoulder;Other Exercises   Other Exercises  reviewed HEP from home health - recommended to continue and added below     Shoulder Exercises: Supine   ABduction Right;AAROM;10 reps   ABduction Limitations to 60 degrees only; with cane     Shoulder Exercises: Isometric Strengthening   External Rotation Limitations RUE 5"x10   ABduction Limitations RUE 5"x10                PT Education - 03/14/16 1249    Education provided Yes   Education Details HEP, continue HHPT HEP   Person(s) Educated Patient   Methods Explanation;Demonstration;Handout   Comprehension Verbalized understanding;Returned demonstration;Need further instruction          PT Short Term Goals - 03/14/16 1252      PT SHORT TERM GOAL #1   Title improve PROM to WNL for improved motion (04/11/16)   Time 4   Period Weeks   Status New     PT SHORT TERM GOAL #2   Title independent with initial HEP (04/11/16)   Time 4   Period Weeks   Status New           PT Long Term Goals - 03/14/16 1253      PT LONG TERM GOAL #1   Title improve AROM to [facility] for improved function (05/09/16)   Time 8   Period Weeks   Status New     PT LONG TERM GOAL #2   Title independent with advanced HEP (05/09/16)   Time 8   Period Weeks   Status New     PT LONG TERM GOAL #3   Title improve R shoulder strength to at least 3+/5 for improved function (05/09/16)   Time 8   Period Weeks   Status New               Plan - 03/14/16 1250    Clinical Impression  Statement Pt is  a 70 y/o male who presents to OPPT for low complexity PT evaluation s/p R reverse total shoulder arthroplasty.  Pt demonstrates decreased ROM, strength, postural abnormalities and pain affecting function.  Will benefit from PT to address deficits listed.   Rehab Potential Good   PT Frequency 2x / week   PT Duration 8 weeks   PT Treatment/Interventions ADLs/Self Care Home Management;Electrical Stimulation;Cryotherapy;Moist Heat;Ultrasound;Therapeutic exercise;Therapeutic activities;Functional mobility training;Patient/family education;Passive range of motion;Manual techniques;Vasopneumatic Device;Taping;Dry needling   PT Next Visit Plan ROM within protocol limits, progress per protocol   Consulted and Agree with Plan of Care Patient      Patient will benefit from skilled therapeutic intervention in order to improve the following deficits and impairments:  Decreased strength, Pain, Impaired UE functional use, Decreased range of motion, Postural dysfunction  Visit Diagnosis: Pain in right shoulder - Plan: PT plan of care cert/re-cert  Stiffness of right shoulder, not elsewhere classified - Plan: PT plan of care cert/re-cert  Abnormal posture - Plan: PT plan of care cert/re-cert      G-Codes - 03-15-2016 1256    Functional Assessment Tool Used FOTO 68% limited   Functional Limitation Carrying, moving and handling objects   Carrying, Moving and Handling Objects Current Status (N1700) At least 60 percent but less than 80 percent impaired, limited or restricted   Carrying, Moving and Handling Objects Goal Status (F7494) At least 20 percent but less than 40 percent impaired, limited or restricted       Problem List Patient Active Problem List   Diagnosis Date Noted  . 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 Impingement syndrome of right shoulder 08/07/2014  . Pain in the chest 02/09/2014  . Shortness of breath 02/09/2014  . Gastroesophageal reflux disease  without esophagitis 02/09/2014  . Other and unspecified hyperlipidemia 09/08/2013  . Hereditary and idiopathic peripheral neuropathy 07/22/2013  . Allergic rhinitis, cause unspecified 07/14/2013  . Pain in joint, pelvic region and thigh 07/14/2013  . Elevated blood pressure 05/11/2013  . Insomnia 05/11/2013  . BPH (benign prostatic hypertrophy) 05/11/2013  . Helicobacter pylori (H. pylori) 02/14/2013  . Irritable bowel syndrome 02/14/2013  . Encounter for therapeutic drug monitoring 02/09/2013  . GERD (gastroesophageal reflux disease) 02/09/2013       Clarita Crane, PT, DPT March 15, 2016 12:58 PM    [facility] [address]  [address] [place], [place], [address] Phone: [phone]   Fax:  [phone]  Name: Jon Bush MRN: [id number] Date of Birth: 1946/06/06

## 2016-03-14 NOTE — Patient Instructions (Signed)
Cane Exercise: Abduction    Hold cane with right hand over end, palm-up, with other hand palm-down. Move arm out from side and up by pushing with other arm. DO NOT GO PAST 60 DEGREES.   Repeat __10-15__ times. Do __2-3__ sessions per day.  http://gt2.exer.us/81   Copyright  VHI. All rights reserved.    External Rotation (Isometric)    Place back of right fist against door frame, with elbow bent. Press fist against door frame. Hold __5__ seconds. Repeat _10___ times. Do __2-3__ sessions per day.  http://gt2.exer.us/109   Copyright  VHI. All rights reserved.   Strengthening: Isometric Abduction    Using wall for resistance, press left arm into ball using light pressure. Hold __5__ seconds. Repeat _10___ times per set. Do __1__ sets per session. Do _2-3___ sessions per day.  http://orth.exer.us/806   Copyright  VHI. All rights reserved.

## 2016-03-17 ENCOUNTER — Ambulatory Visit: Payer: Medicare Other | Admitting: Physical Therapy

## 2016-03-17 DIAGNOSIS — M25511 Pain in right shoulder: Secondary | ICD-10-CM | POA: Diagnosis not present

## 2016-03-17 DIAGNOSIS — M25611 Stiffness of right shoulder, not elsewhere classified: Secondary | ICD-10-CM

## 2016-03-17 DIAGNOSIS — R293 Abnormal posture: Secondary | ICD-10-CM

## 2016-03-17 NOTE — Therapy (Signed)
[facility] [address]  [address] [place], [place], [address] Phone: [phone]   Fax:  [phone]  Physical Therapy Treatment  Patient Details  Name: Jon Bush MRN: [id number] Date of Birth: Dec 20, 1945 Referring Provider: Dr. Francena Hanly  Encounter Date: 03/17/2016      PT End of Session - 03/17/16 1108    Visit Number 2   Number of Visits 16   Date for PT Re-Evaluation 05/09/16   PT Start Time 1030   PT Stop Time 1118   PT Time Calculation (min) 48 min   Activity Tolerance Patient tolerated treatment well   Behavior During Therapy [facility] for tasks assessed/performed      Past Medical History:  Diagnosis Date  . Amaurosis fugax of left eye   . Anemia   . BPH (benign prostatic hyperplasia)   . Central retinal vein occlusion of left eye    (Left) Dr. Guy Begin [facility]); Dr. Memory Argue [facility])   . Enlarged prostate   . H. pylori infection 02/14/2013  . High cholesterol   . Hypertension   . IBS (irritable bowel syndrome)   . Kidney stone   . Kidney stones   . Macular hole of left eye 2005  . Migraine headache   . Retinal detachment 2005  . Sleep apnea    cpap    Past Surgical History:  Procedure Laterality Date  . HERNIA REPAIR    . KIDNEY STONE SURGERY    . LITHOTRIPSY    . RETINAL DETACHMENT SURGERY    . REVERSE SHOULDER ARTHROPLASTY Right 02/18/2016  . REVERSE SHOULDER ARTHROPLASTY Right 02/18/2016   Procedure: RIGHT REVERSE SHOULDER ARTHROPLASTY;  Surgeon: Francena Hanly, MD;  Location: [facility] OR;  Service: Orthopedics;  Laterality: Right;    There were no vitals filed for this visit.      Subjective Assessment - 03/17/16 1032    Subjective had some pain yesterday; better today but still a little sore   Limitations Lifting;House hold activities   Patient Stated Goals regain UE use as much as possible; decrease pain   Currently in Pain? Yes   Pain Score 3    Pain Location Shoulder    Pain Orientation Right   Pain Descriptors / Indicators Aching   Pain Type Surgical pain   Pain Onset 1 to 4 weeks ago   Pain Frequency Intermittent   Aggravating Factors  activity   Pain Relieving Factors rest, immobilization, ice, support                         [facility] Adult PT Treatment/Exercise - 03/17/16 1033      Shoulder Exercises: Supine   Flexion Right;10 reps   Flexion Limitations concentric AROM; eccentric AAROM   Other Supine Exercises scap retraction x10     Shoulder Exercises: Seated   Retraction Both;10 reps;Limitations   Retraction Limitations 5 sec hold     Shoulder Exercises: Sidelying   Flexion Right;10 reps;Limitations   Flexion Limitations on foam roll for support   Other Sidelying Exercises R scap retraction 10x10"     Shoulder Exercises: Pulleys   Flexion 3 minutes   ABduction 3 minutes   ABduction Limitations scaption     Shoulder Exercises: Isometric Strengthening   Extension Supine   Extension Limitations RUE 5"x10   External Rotation Supine   External Rotation Limitations RUE 5"x10   ABduction Supine   ABduction Limitations RUE 5"x10  Modalities   Modalities Vasopneumatic     Vasopneumatic   Number Minutes Vasopneumatic  15 minutes   Vasopnuematic Location  Shoulder   Vasopneumatic Pressure Medium   Vasopneumatic Temperature  max cold     Manual Therapy   Manual Therapy Passive ROM   Passive ROM er, flexion, abdct within protocol limitations                  PT Short Term Goals - 03/14/16 1252      PT SHORT TERM GOAL #1   Title improve PROM to WNL for improved motion (04/11/16)   Time 4   Period Weeks   Status New     PT SHORT TERM GOAL #2   Title independent with initial HEP (04/11/16)   Time 4   Period Weeks   Status New           PT Long Term Goals - 03/14/16 1253      PT LONG TERM GOAL #1   Title improve AROM to [facility] for improved function (05/09/16)   Time 8   Period Weeks   Status  New     PT LONG TERM GOAL #2   Title independent with advanced HEP (05/09/16)   Time 8   Period Weeks   Status New     PT LONG TERM GOAL #3   Title improve R shoulder strength to at least 3+/5 for improved function (05/09/16)   Time 8   Period Weeks   Status New               Plan - 03/17/16 1108    Clinical Impression Statement Initiated AROM exercises today within protocol limits; pain with eccentric lowering so supported with activity.  Will continue to benefit from PT to maximize function and decrease pain.   PT Treatment/Interventions ADLs/Self Care Home Management;Electrical Stimulation;Cryotherapy;Moist Heat;Ultrasound;Therapeutic exercise;Therapeutic activities;Functional mobility training;Patient/family education;Passive range of motion;Manual techniques;Vasopneumatic Device;Taping;Dry needling   PT Next Visit Plan progress ROM slowly as tolerated, progress per protocol   Consulted and Agree with Plan of Care Patient      Patient will benefit from skilled therapeutic intervention in order to improve the following deficits and impairments:  Decreased strength, Pain, Impaired UE functional use, Decreased range of motion, Postural dysfunction  Visit Diagnosis: Pain in right shoulder  Stiffness of right shoulder, not elsewhere classified  Abnormal posture     Problem List Patient Active Problem List   Diagnosis Date Noted  . 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 Impingement syndrome of right shoulder 08/07/2014  . Pain in the chest 02/09/2014  . Shortness of breath 02/09/2014  . Gastroesophageal reflux disease without esophagitis 02/09/2014  . Other and unspecified hyperlipidemia 09/08/2013  . Hereditary and idiopathic peripheral neuropathy 07/22/2013  . Allergic rhinitis, cause unspecified 07/14/2013  . Pain in joint, pelvic region and thigh 07/14/2013  . Elevated blood pressure 05/11/2013  . Insomnia 05/11/2013  . BPH (benign prostatic  hypertrophy) 05/11/2013  . Helicobacter pylori (H. pylori) 02/14/2013  . Irritable bowel syndrome 02/14/2013  . Encounter for therapeutic drug monitoring 02/09/2013  . GERD (gastroesophageal reflux disease) 02/09/2013      Clarita Crane, PT, DPT 03/17/16 11:40 AM   [facility] [address]  [address] [place], [place], [address] Phone: [phone]   Fax:  [phone]  Name: Jon Bush MRN: [id number] Date of Birth: July 29, 1946

## 2016-03-21 ENCOUNTER — Ambulatory Visit: Payer: Medicare Other

## 2016-03-21 DIAGNOSIS — R293 Abnormal posture: Secondary | ICD-10-CM

## 2016-03-21 DIAGNOSIS — M25511 Pain in right shoulder: Secondary | ICD-10-CM | POA: Diagnosis not present

## 2016-03-21 DIAGNOSIS — M25611 Stiffness of right shoulder, not elsewhere classified: Secondary | ICD-10-CM

## 2016-03-21 NOTE — Therapy (Signed)
[facility] Out[facility] Rehabilitation [facility] [address]  [address] [place], [place], [address] Phone: [phone]   Fax:  [phone]  Physical Therapy Treatment  Patient Details  Name: Jon Bush MRN: [id number] Date of Birth: 01/22/1946 Referring Provider: Dr. Francena Hanly  Encounter Date: 03/21/2016      PT End of Session - 03/21/16 0855    Visit Number 3   Number of Visits 16   Date for PT Re-Evaluation 05/09/16   PT Start Time 0849   PT Stop Time 0928   PT Time Calculation (min) 39 min   Activity Tolerance Patient tolerated treatment well   Behavior During Therapy [facility] for tasks assessed/performed      Past Medical History:  Diagnosis Date  . Amaurosis fugax of left eye   . Anemia   . BPH (benign prostatic hyperplasia)   . Central retinal vein occlusion of left eye    (Left) Dr. Guy Begin [facility]); Dr. Memory Argue [facility])   . Enlarged prostate   . H. pylori infection 02/14/2013  . High cholesterol   . Hypertension   . IBS (irritable bowel syndrome)   . Kidney stone   . Kidney stones   . Macular hole of left eye 2005  . Migraine headache   . Retinal detachment 2005  . Sleep apnea    cpap    Past Surgical History:  Procedure Laterality Date  . HERNIA REPAIR    . KIDNEY STONE SURGERY    . LITHOTRIPSY    . RETINAL DETACHMENT SURGERY    . REVERSE SHOULDER ARTHROPLASTY Right 02/18/2016  . REVERSE SHOULDER ARTHROPLASTY Right 02/18/2016   Procedure: RIGHT REVERSE SHOULDER ARTHROPLASTY;  Surgeon: Francena Hanly, MD;  Location: [facility] OR;  Service: Orthopedics;  Laterality: Right;    There were no vitals filed for this visit.      Subjective Assessment - 03/21/16 0853    Subjective Pt. reports his R shoulder pain initially today is a 2/10 at a dull ache and that he currently wakes up a few times a night to reapply ice to shoulder.      Currently in Pain? Yes   Pain Score 2    Pain Location Shoulder   Pain Orientation Right    Pain Descriptors / Indicators Aching   Pain Type Surgical pain   Pain Onset 1 to 4 weeks ago   Pain Frequency Intermittent   Aggravating Factors  activity    Multiple Pain Sites No      Today's Treatment:  Manual: R shoulder PROM all directions STM/TPR to R teres minor and UT; pt. with only mild TTP with this R shoulder AAROM with therapist guidance into ER, IR, and flexion x 10 reps each  Therex: Pulleys for R shoulder flexion, scaption x 2 min each  R shoulder AAROM with wand in ER/IR x 15 reps   Verbal HEP review (pt. Instructed to keep performing HEP from HHP): Pendulums  R shoulder ER isometrics  R shoulder IR isometrics    Vasoneumatic Device to R shoulder: seated with shoulder in ~ 30 dg, 15 min, coldest temp., medium compression        PT Short Term Goals - 03/21/16 0917      PT SHORT TERM GOAL #1   Title improve PROM to WNL for improved motion (04/11/16)   Time 4   Period Weeks   Status On-going     PT SHORT TERM GOAL #2   Title independent with initial  HEP (04/11/16)   Time 4   Period Weeks   Status On-going           PT Long Term Goals - 03/21/16 0917      PT LONG TERM GOAL #1   Title improve AROM to [facility] for improved function (05/09/16)   Time 8   Period Weeks   Status On-going     PT LONG TERM GOAL #2   Title independent with advanced HEP (05/09/16)   Time 8   Period Weeks   Status On-going     PT LONG TERM GOAL #3   Title improve R shoulder strength to at least 3+/5 for improved function (05/09/16)   Time 8   Period Weeks   Status On-going               Plan - 03/21/16 1256    Clinical Impression Statement Pt. reports his R shoulder pain initially today is a 2/10 at a dull ache and that he currently wakes up a few times a night to reapply ice to shoulder.   Pt. pain did not rise above a 3/10 with therex today; today's treatment focused on R shoulder passive stretching, isometric strengthening, and STM to R UT/teres minor.  Pt.  tolerated all therex well and showed only mild TTP with STM.  Pt. would continue to benefit from postural training and R shoulder AAROM as protocal allow.     PT Treatment/Interventions ADLs/Self Care Home Management;Electrical Stimulation;Cryotherapy;Moist Heat;Ultrasound;Therapeutic exercise;Therapeutic activities;Functional mobility training;Patient/family education;Passive range of motion;Manual techniques;Vasopneumatic Device;Taping;Dry needling   PT Next Visit Plan progress ROM slowly as tolerated, progress per protocol      Patient will benefit from skilled therapeutic intervention in order to improve the following deficits and impairments:  Decreased strength, Pain, Impaired UE functional use, Decreased range of motion, Postural dysfunction  Visit Diagnosis: Pain in right shoulder  Stiffness of right shoulder, not elsewhere classified  Abnormal posture     Problem List Patient Active Problem List   Diagnosis Date Noted  . 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 Impingement syndrome of right shoulder 08/07/2014  . Pain in the chest 02/09/2014  . Shortness of breath 02/09/2014  . Gastroesophageal reflux disease without esophagitis 02/09/2014  . Other and unspecified hyperlipidemia 09/08/2013  . Hereditary and idiopathic peripheral neuropathy 07/22/2013  . Allergic rhinitis, cause unspecified 07/14/2013  . Pain in joint, pelvic region and thigh 07/14/2013  . Elevated blood pressure 05/11/2013  . Insomnia 05/11/2013  . BPH (benign prostatic hypertrophy) 05/11/2013  . Helicobacter pylori (H. pylori) 02/14/2013  . Irritable bowel syndrome 02/14/2013  . Encounter for therapeutic drug monitoring 02/09/2013  . GERD (gastroesophageal reflux disease) 02/09/2013    Kermit Balo, PTA 03/21/2016, 1:00 PM  [facility] [address]  [address] [place], [place], [address] Phone: [phone]   Fax:   [phone]  Name: Jon Bush MRN: [id number] Date of Birth: 12-02-1945

## 2016-03-24 ENCOUNTER — Ambulatory Visit: Payer: Medicare Other | Attending: Orthopedic Surgery | Admitting: Physical Therapy

## 2016-03-24 DIAGNOSIS — M25611 Stiffness of right shoulder, not elsewhere classified: Secondary | ICD-10-CM | POA: Diagnosis present

## 2016-03-24 DIAGNOSIS — M25511 Pain in right shoulder: Secondary | ICD-10-CM | POA: Diagnosis not present

## 2016-03-24 DIAGNOSIS — R293 Abnormal posture: Secondary | ICD-10-CM

## 2016-03-24 NOTE — Patient Instructions (Signed)
SHOULDER: Flexion - Supine    Raise right arm overhead with palm up. _15__ reps per set, _1-2__ sets per day, _7__ days per week   Copyright  VHI. All rights reserved.    Scapular: Protraction - 90 of Flexion    Holding __NO__ pound weights, attempt to push arms up toward ceiling, keeping elbows straight and back against floor. Repeat __15__ times per set. Do __1__ sets per session. Do __1-2__ sessions per day.  http://orth.exer.us/856   Copyright  VHI. All rights reserved.    Abduction (Side-Lying)    Lie on left side. Raise arm above head. Keep palm forward.  STOP AT 90 DEGREES (DON'T GO PAST HAND POINTING TO CEILING) Repeat __15__ times per set. Do _1___ sets per session. Do _1-2___ sessions per day.  http://orth.exer.us/934   Copyright  VHI. All rights reserved.   Scapular Retraction (Standing)    With arms at sides, pinch shoulder blades together.  Hold 10 seconds.  Repeat __15__ times per set. Do __1__ sets per session. Do __1-2__ sessions per day.  http://orth.exer.us/944   Copyright  VHI. All rights reserved.

## 2016-03-24 NOTE — Therapy (Signed)
[facility] Outpatient Rehabilitation [facility] [address]  [address] [place], [place], [address] Phone: [phone]   Fax:  [phone]  Physical Therapy Treatment  Patient Details  Name: Jon Bush MRN: [id number] Date of Birth: April 04, 1946 Referring Provider: Dr. Francena Hanly  Encounter Date: 03/24/2016      PT End of Session - 03/24/16 1340    Visit Number 4   Number of Visits 16   Date for PT Re-Evaluation 05/09/16   PT Start Time 1300   PT Stop Time 1356   PT Time Calculation (min) 56 min   Activity Tolerance Patient tolerated treatment well   Behavior During Therapy [facility] for tasks assessed/performed      Past Medical History:  Diagnosis Date  . Amaurosis fugax of left eye   . Anemia   . BPH (benign prostatic hyperplasia)   . Central retinal vein occlusion of left eye    (Left) Dr. Guy Begin [facility]); Dr. Memory Argue [facility])   . Enlarged prostate   . H. pylori infection 02/14/2013  . High cholesterol   . Hypertension   . IBS (irritable bowel syndrome)   . Kidney stone   . Kidney stones   . Macular hole of left eye 2005  . Migraine headache   . Retinal detachment 2005  . Sleep apnea    cpap    Past Surgical History:  Procedure Laterality Date  . HERNIA REPAIR    . KIDNEY STONE SURGERY    . LITHOTRIPSY    . RETINAL DETACHMENT SURGERY    . REVERSE SHOULDER ARTHROPLASTY Right 02/18/2016  . REVERSE SHOULDER ARTHROPLASTY Right 02/18/2016   Procedure: RIGHT REVERSE SHOULDER ARTHROPLASTY;  Surgeon: Francena Hanly, MD;  Location: [facility] OR;  Service: Orthopedics;  Laterality: Right;    There were no vitals filed for this visit.      Subjective Assessment - 03/24/16 1302    Subjective thinks shoulder is feeling better; not having a lot of pain.   Limitations Lifting;House hold activities   Patient Stated Goals regain UE use as much as possible; decrease pain   Currently in Pain? No/denies                          [facility] Adult PT Treatment/Exercise - 03/24/16 1303      Shoulder Exercises: Supine   Protraction Right;20 reps   Flexion Right;20 reps;AROM     Shoulder Exercises: Seated   Retraction Both;10 reps;Limitations   Retraction Limitations 10 sec hold     Shoulder Exercises: Standing   Flexion AAROM;10 reps   Flexion Limitations wall ladder x 5   ABduction AAROM;10 reps   ABduction Limitations mod A needed to decr shoulder shrug     Shoulder Exercises: Pulleys   Flexion 3 minutes   ABduction 3 minutes   ABduction Limitations scaption     Vasopneumatic   Number Minutes Vasopneumatic  15 minutes   Vasopnuematic Location  Shoulder   Vasopneumatic Pressure Low   Vasopneumatic Temperature  max cold     Manual Therapy   Manual Therapy Passive ROM   Passive ROM er, flexion, abdct within protocol limitations                PT Education - 03/24/16 1339    Education provided Yes   Education Details AROM HEP   Person(s) Educated Patient   Methods Explanation;Demonstration;Handout;Verbal cues   Comprehension Verbalized understanding;Returned demonstration;Need further instruction;Verbal cues required  PT Short Term Goals - 03/21/16 0917      PT SHORT TERM GOAL #1   Title improve PROM to WNL for improved motion (04/11/16)   Time 4   Period Weeks   Status On-going     PT SHORT TERM GOAL #2   Title independent with initial HEP (04/11/16)   Time 4   Period Weeks   Status On-going           PT Long Term Goals - 03/21/16 0917      PT LONG TERM GOAL #1   Title improve AROM to [facility] for improved function (05/09/16)   Time 8   Period Weeks   Status On-going     PT LONG TERM GOAL #2   Title independent with advanced HEP (05/09/16)   Time 8   Period Weeks   Status On-going     PT LONG TERM GOAL #3   Title improve R shoulder strength to at least 3+/5 for improved function (05/09/16)   Time 8   Period Weeks   Status On-going               Plan  - 03/24/16 1340    Clinical Impression Statement Pt appears to have some mild short term memory deficits needing frequent reminders for exercise and asking same questions.  Slowly progressing well towards goals.  Able to progress ROM per protocol today and tolerated well.   PT Treatment/Interventions ADLs/Self Care Home Management;Electrical Stimulation;Cryotherapy;Moist Heat;Ultrasound;Therapeutic exercise;Therapeutic activities;Functional mobility training;Patient/family education;Passive range of motion;Manual techniques;Vasopneumatic Device;Taping;Dry needling   PT Next Visit Plan progress ROM slowly as tolerated, progress per protocol   Consulted and Agree with Plan of Care Patient      Patient will benefit from skilled therapeutic intervention in order to improve the following deficits and impairments:  Decreased strength, Pain, Impaired UE functional use, Decreased range of motion, Postural dysfunction  Visit Diagnosis: Pain in right shoulder  Stiffness of right shoulder, not elsewhere classified  Abnormal posture     Problem List Patient Active Problem List   Diagnosis Date Noted  . 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 Impingement syndrome of right shoulder 08/07/2014  . Pain in the chest 02/09/2014  . Shortness of breath 02/09/2014  . Gastroesophageal reflux disease without esophagitis 02/09/2014  . Other and unspecified hyperlipidemia 09/08/2013  . Hereditary and idiopathic peripheral neuropathy 07/22/2013  . Allergic rhinitis, cause unspecified 07/14/2013  . Pain in joint, pelvic region and thigh 07/14/2013  . Elevated blood pressure 05/11/2013  . Insomnia 05/11/2013  . BPH (benign prostatic hypertrophy) 05/11/2013  . Helicobacter pylori (H. pylori) 02/14/2013  . Irritable bowel syndrome 02/14/2013  . Encounter for therapeutic drug monitoring 02/09/2013  . GERD (gastroesophageal reflux disease) 02/09/2013       Clarita Crane, PT,  DPT 03/24/16 2:29 PM    [facility] Outpatient Rehabilitation [facility] [address]  [address] [place], [place], [address] Phone: [phone]   Fax:  [phone]  Name: Jon Bush MRN: [id number] Date of Birth: 10-Jul-1946

## 2016-04-04 ENCOUNTER — Ambulatory Visit: Payer: Medicare Other | Admitting: Physical Therapy

## 2016-04-04 DIAGNOSIS — M25511 Pain in right shoulder: Secondary | ICD-10-CM

## 2016-04-04 DIAGNOSIS — R293 Abnormal posture: Secondary | ICD-10-CM

## 2016-04-04 DIAGNOSIS — M25611 Stiffness of right shoulder, not elsewhere classified: Secondary | ICD-10-CM

## 2016-04-04 NOTE — Therapy (Signed)
[facility] [address]  [address] [place], [place], [address] Phone: [phone]   Fax:  [phone]  Physical Therapy Treatment  Patient Details  Name: Jon EmeryRichard Bush MRN: [id number] Date of Birth: [birth year] Referring Provider: Dr. Francena HanlyKevin Supple  Encounter Date: 04/04/2016      PT End of Session - 04/04/16 1349    Visit Number 5   Number of Visits 16   Date for PT Re-Evaluation 05/09/16   PT Start Time 1310   PT Stop Time 1404   PT Time Calculation (min) 54 min   Activity Tolerance Patient tolerated treatment well   Behavior During Therapy [facility] for tasks assessed/performed      Past Medical History:  Diagnosis Date  . Amaurosis fugax of left eye   . Anemia   . BPH (benign prostatic hyperplasia)   . Central retinal vein occlusion of left eye    (Left) [facility]. Guy [facility]t [facility] Dr. Memory Argueraig [facility]   . Enlarged prostate   . H. pylori infection 02/14/2013  . High cholesterol   . Hypertension   . IBS (irritable bowel syndrome)   . Kidney stone   . Kidney stones   . Macular hole of left eye 2005  . Migraine headache   . Retinal detachment 2005  . Sleep apnea    cpap    Past Surgical History:  Procedure Laterality Date  . HERNIA REPAIR    . KIDNEY STONE SURGERY    . LITHOTRIPSY    . RETINAL DETACHMENT SURGERY    . REVERSE SHOULDER ARTHROPLASTY Right 02/18/2016  . REVERSE SHOULDER ARTHROPLASTY Right 02/18/2016   Procedure: RIGHT REVERSE SHOULDER ARTHROPLASTY;  Surgeon: Francena HanlyKevin Supple, MD;  Location: [facility] OR;  Service: Orthopedics;  Laterality: Right;    There were no vitals filed for this visit.      Subjective Assessment - 04/04/16 1306    Subjective shoulder doing better; having some pain but not a lot.  went to beach last week - had a good time   Limitations Lifting;House hold activities   Patient Stated Goals regain UE use as much as possible; decrease pain   Currently in Pain? No/denies                         [facility]/Exercise - 04/04/16 1313      Shoulder Exercises: Supine   Other Supine Exercises R elbow flexion 3# x 20     Shoulder Exercises: Seated   Retraction Both;20 reps   Retraction Limitations 10 sec hold     Shoulder Exercises: Standing   Flexion Limitations wall ladder x 5   Other Standing Exercises scaption wall ladder x  5     Shoulder Exercises: Pulleys   Flexion 3 minutes   ABduction 3 minutes   ABduction Limitations scaption     Shoulder Exercises: Therapy Ball   Flexion 10 reps   Flexion Limitations 10 sec hold; seated   Right/Left 10 reps   Right/Left Limitations 10 sec hold; seated     Shoulder Exercises: Isometric Strengthening   Flexion Supine   Flexion Limitations RUE 5"x10   Extension Supine   Extension Limitations RUE 5"x10   External Rotation Supine   External Rotation Limitations RUE 5"x10   Internal Rotation Supine;Limitations   Internal Rotation Limitations RUE 5"x10   ABduction Supine   ABduction Limitations RUE 5"x10   ADduction Supine   ADduction Limitations RUE 5"x10  Vasopneumatic   Number Minutes Vasopneumatic  15 minutes   Vasopnuematic Location  Shoulder   Vasopneumatic Pressure Low   Vasopneumatic Temperature  max cold     Manual Therapy   Manual Therapy Passive ROM   Passive ROM er, flexion, abdct within protocol limitations                  PT Short Term Goals - 03/21/16 0917      PT SHORT TERM GOAL #1   Title improve PROM to WNL for improved motion (04/11/16)   Time 4   Period Weeks   Status On-going     PT SHORT TERM GOAL #2   Title independent with initial HEP (04/11/16)   Time 4   Period Weeks   Status On-going           PT Long Term Goals - 03/21/16 0917      PT LONG TERM GOAL #1   Title [facility] for improved function (05/09/16)   Time 8   Period Weeks   Status On-going     PT LONG TERM GOAL #2   Title independent with  advanced HEP (05/09/16)   Time 8   Period Weeks   Status On-going     PT LONG TERM GOAL #3   Title improve R shoulder strength to at least 3+/5 for improved function (05/09/16)   Time 8   Period Weeks   Status On-going               Plan - 04/04/16 1349    Clinical Impression Statement Pt tolerated exercises well with min increase in pain with exercises.  PROM all to protocol limits except flexion to 110 degrees only.  Will conitnue to benefit from PT to maximize function.   PT Treatment/Interventions ADLs/Self Care Home Management;Electrical Stimulation;Cryotherapy;Moist Heat;Ultrasound;Therapeutic exercise;Therapeutic activities;Functional mobility training;Patient/family education;Passive range of motion;Manual techniques;Vasopneumatic Device;Taping;Dry needling   PT Next Visit Plan progress ROM slowly as tolerated, progress per protocol      Patient will benefit from skilled therapeutic intervention in order to improve the following deficits and impairments:  Decreased strength, Pain, Impaired UE functional use, Decreased range of motion, Postural dysfunction  Visit Diagnosis: Pain in right shoulder  Stiffness of right shoulder, not elsewhere classified  Abnormal posture     Problem List Patient Active Problem List   Diagnosis Date Noted  . 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 Impingement syndrome of right shoulder 08/07/2014  . Pain in the chest 02/09/2014  . Shortness of breath 02/09/2014  . Gastroesophageal reflux disease without esophagitis 02/09/2014  . Other and unspecified hyperlipidemia 09/08/2013  . Hereditary and idiopathic peripheral neuropathy 07/22/2013  . Allergic rhinitis, cause unspecified 07/14/2013  . Pain in joint, pelvic region and thigh 07/14/2013  . Elevated blood pressure 05/11/2013  . Insomnia 05/11/2013  . BPH (benign prostatic hypertrophy) 05/11/2013  . Helicobacter pylori (H. pylori) 02/14/2013  . Irritable bowel  syndrome 02/14/2013  . Encounter for therapeutic drug monitoring 02/09/2013  . GERD (gastroesophageal reflux disease) 02/09/2013       Clarita CraneStephanie F Louden Houseworth, PT, DPT 04/04/16 2:04 PM    [facility] [place] [address]  [address] [place], [place], [address] Phone: [phone]   Fax:  [phone]  Name: Jon EmeryRichard Burridge MRN: [id number] Date of Birth: February 09, 1946

## 2016-04-07 ENCOUNTER — Ambulatory Visit: Payer: Medicare Other | Admitting: Physical Therapy

## 2016-04-07 DIAGNOSIS — R293 Abnormal posture: Secondary | ICD-10-CM

## 2016-04-07 DIAGNOSIS — M25611 Stiffness of right shoulder, not elsewhere classified: Secondary | ICD-10-CM

## 2016-04-07 DIAGNOSIS — M25511 Pain in right shoulder: Secondary | ICD-10-CM

## 2016-04-07 NOTE — Therapy (Signed)
[facility] [address]  [place], [place], [address] Phone: [phone]   Fax:  [phone]  Physical Therapy Treatment  Patient Details  Name: Jon Bush MRN: [id number] Date of Birth: 1945-11-10 Referring Provider: Dr. Justice Britain  Encounter Date: 04/07/2016      PT End of Session - 04/07/16 1247    Visit Number 6   Number of Visits 16   Date for PT Re-Evaluation 05/09/16   PT Start Time 8250  pt arrived late   PT Stop Time 1025   PT Time Calculation (min) 42 min   Activity Tolerance Patient tolerated treatment well   Behavior During Therapy [facility] for tasks assessed/performed      Past Medical History:  Diagnosis Date  . Amaurosis fugax of left eye   . Anemia   . BPH (benign prostatic hyperplasia)   . Central retinal vein occlusion of left eye    (Left) Dr. Fenton Foy [facility]); Dr. Newell Coral [facility])   . Enlarged prostate   . H. pylori infection 02/14/2013  . High cholesterol   . Hypertension   . IBS (irritable bowel syndrome)   . Kidney stone   . Kidney stones   . Macular hole of left eye 2005  . Migraine headache   . Retinal detachment 2005  . Sleep apnea    cpap    Past Surgical History:  Procedure Laterality Date  . HERNIA REPAIR    . KIDNEY STONE SURGERY    . LITHOTRIPSY    . RETINAL DETACHMENT SURGERY    . REVERSE SHOULDER ARTHROPLASTY Right 02/18/2016  . REVERSE SHOULDER ARTHROPLASTY Right 02/18/2016   Procedure: RIGHT REVERSE SHOULDER ARTHROPLASTY;  Surgeon: Justice Britain, MD;  Location: [place];  Service: Orthopedics;  Laterality: Right;    There were no vitals filed for this visit.      Subjective Assessment - 04/07/16 0945    Subjective having some pain in shoulder still - taking 600 mg of Ibuprofen in morning only.   Limitations Lifting;House hold activities   Patient Stated Goals regain UE use as much as possible; decrease pain   Currently in Pain? Yes   Pain  Score 5    Pain Location Shoulder   Pain Orientation Right   Pain Descriptors / Indicators Aching   Pain Type Surgical pain   Pain Onset 1 to 4 weeks ago   Pain Frequency Intermittent   Aggravating Factors  activity   Pain Relieving Factors rest, immobilization, ice, support            [facility] PT Assessment - 04/07/16 0959      PROM   Right Shoulder Flexion 113 Degrees   Right Shoulder ABduction 83 Degrees   Right Shoulder Internal Rotation 71 Degrees   Right Shoulder External Rotation 45 Degrees                     [facility] Adult PT Treatment/Exercise - 04/07/16 0947      Shoulder Exercises: Supine   External Rotation Right;AAROM;10 reps   Internal Rotation Right;AAROM;10 reps   Flexion Right;10 reps;AAROM   ABduction Right;AAROM;10 reps     Shoulder Exercises: Pulleys   Flexion 3 minutes   ABduction 3 minutes   ABduction Limitations scaption     Shoulder Exercises: Therapy Ball   Flexion 10 reps   Flexion Limitations 10 sec hold; seated   ABduction 10 reps   ABduction Limitations 10 sec hold; seated  Shoulder Exercises: Isometric Strengthening   Flexion Supine   Flexion Limitations RUE 5"x10   Extension Supine   Extension Limitations RUE 5"x10   External Rotation Supine   External Rotation Limitations RUE 5"x10   Internal Rotation Supine;Limitations   Internal Rotation Limitations RUE 5"x10   ABduction Supine   ABduction Limitations RUE 5"x10   ADduction Supine   ADduction Limitations RUE 5"x10     Modalities   Modalities Cryotherapy     Cryotherapy   Number Minutes Cryotherapy 10 Minutes   Cryotherapy Location Shoulder   Type of Cryotherapy Ice pack                  PT Short Term Goals - 04/07/16 1247      PT SHORT TERM GOAL #1   Title improve PROM to WNL for improved motion (04/11/16)   Baseline achieved within protocol limitations   Status Achieved     PT SHORT TERM GOAL #2   Title independent with initial HEP  (04/11/16)   Status Achieved           PT Long Term Goals - 03/21/16 0917      PT LONG TERM GOAL #1   Title improve AROM to [facility] for improved function (05/09/16)   Time 8   Period Weeks   Status On-going     PT LONG TERM GOAL #2   Title independent with advanced HEP (05/09/16)   Time 8   Period Weeks   Status On-going     PT LONG TERM GOAL #3   Title improve R shoulder strength to at least 3+/5 for improved function (05/09/16)   Time 8   Period Weeks   Status On-going               Plan - 04/07/16 1247    Clinical Impression Statement Pt tolerates all active assistive exercises well and have begun to initiate active exercises.  PROM is near protocol limitations and goal met.  Will continue to benefit from PT to maximize function and progress strength and active motion.   PT Treatment/Interventions ADLs/Self Care Home Management;Electrical Stimulation;Cryotherapy;Moist Heat;Ultrasound;Therapeutic exercise;Therapeutic activities;Functional mobility training;Patient/family education;Passive range of motion;Manual techniques;Vasopneumatic Device;Taping;Dry needling   PT Next Visit Plan progress ROM slowly as tolerated, progress per protocol      Patient will benefit from skilled therapeutic intervention in order to improve the following deficits and impairments:  Decreased strength, Pain, Impaired UE functional use, Decreased range of motion, Postural dysfunction  Visit Diagnosis: Pain in right shoulder  Stiffness of right shoulder, not elsewhere classified  Abnormal posture     Problem List Patient Active Problem List   Diagnosis Date Noted  . 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 Impingement syndrome of right shoulder 08/07/2014  . Pain in the chest 02/09/2014  . Shortness of breath 02/09/2014  . Gastroesophageal reflux disease without esophagitis 02/09/2014  . Other and unspecified hyperlipidemia 09/08/2013  . Hereditary and idiopathic  peripheral neuropathy 07/22/2013  . Allergic rhinitis, cause unspecified 07/14/2013  . Pain in joint, pelvic region and thigh 07/14/2013  . Elevated blood pressure 05/11/2013  . Insomnia 05/11/2013  . BPH (benign prostatic hypertrophy) 05/11/2013  . Helicobacter pylori (H. pylori) 02/14/2013  . Irritable bowel syndrome 02/14/2013  . Encounter for therapeutic drug monitoring 02/09/2013  . GERD (gastroesophageal reflux disease) 02/09/2013      Laureen Abrahams, PT, DPT 04/07/16 12:50 PM    [facility] [address]  [place], [place], [address] Phone: [phone]   Fax:  [phone]  Name: Wyn Nettle MRN: [id number] Date of Birth: [birth year]

## 2016-04-12 ENCOUNTER — Ambulatory Visit: Payer: Medicare Other

## 2016-04-12 DIAGNOSIS — M25511 Pain in right shoulder: Secondary | ICD-10-CM | POA: Diagnosis not present

## 2016-04-12 DIAGNOSIS — M25611 Stiffness of right shoulder, not elsewhere classified: Secondary | ICD-10-CM

## 2016-04-12 DIAGNOSIS — R293 Abnormal posture: Secondary | ICD-10-CM

## 2016-04-12 NOTE — Therapy (Addendum)
[facility] [address]  [address] [place], [place], [address] Phone: [phone]   Fax:  [phone]  Physical Therapy Treatment  Patient Details  Name: Jon Bush MRN: [id number] Date of Birth: 1946/06/18 Referring Provider: Dr. Francena HanlyKevin Supple   Encounter Date: 04/12/2016      PT End of Session - 04/12/16 0948    Visit Number 7   Number of Visits 16   Date for PT Re-Evaluation 05/09/16   PT Start Time 0940   PT Stop Time 1018   PT Time Calculation (min) 38 min   Activity Tolerance Patient tolerated treatment well   Behavior [facility] for tasks assessed/performed      Past Medical History:  Diagnosis Date  . Amaurosis fugax of left eye   . Anemia   . BPH (benign prostatic hyperplasia)   . Central retinal vein occlusion of left eye    (Left) Dr. Guy Beginobert DeVanzo [facility] Memory [facility]gueraig [facility]   . Enlarged prostate   . H. pylori infection 02/14/2013  . High cholesterol   . Hypertension   . IBS (irritable bowel syndrome)   . Kidney stone   . Kidney stones   . Macular hole of left eye 2005  . Migraine headache   . Retinal detachment 2005  . Sleep apnea    cpap    Past Surgical History:  Procedure Laterality Date  . HERNIA REPAIR    . KIDNEY STONE SURGERY    . LITHOTRIPSY    . RETINAL DETACHMENT SURGERY    . REVERSE SHOULDER ARTHROPLASTY Right 02/18/2016  . REVERSE SHOULDER ARTHROPLASTY Right 02/18/2016   Procedure: RIGHT REVERSE SHOULDER ARTHROPLASTY;  Surgeon: Francena HanlyKevin Supple, MD;  [facility];  Service: Orthopedics;  Laterality: Right;    There were no vitals filed for this visit.      Subjective Assessment - 04/12/16 0943    Subjective Pt. reports he is pain free in the R shoulder initially today.     Patient Stated Goals regain UE use as much as possible; decrease pain   Currently in Pain? Yes   Pain Score 3    Pain Location Shoulder   Pain Orientation Right   Pain  Descriptors / Indicators Aching   Pain Type Surgical pain   Pain Onset More than a month ago   Pain Frequency Intermittent   Multiple Pain Sites No            [facility] PT Assessment - 04/12/16 0947      Assessment   Medical Diagnosis R RSA    Referring Provider Dr. Francena HanlyKevin Supple    Next MD Visit 05/20/16       Today's treatment:  Manual: R shoulder PROM all directions R shoulder posterior/inferior STM  Therex: Pulleys R shoulder flexion x 3 min Pulleys R shoulder scaption x 3 min  R shoulder isometrics in Supine at 90 dg (neutral):         Flexion 5" x 10 reps         ER 5" x 10 reps         IR 5" x 10 reps         Extension 5" x 10 reps         Horizontal abd/add 5" x 10 reps         PT Short Term Goals - 04/07/16 1247      PT SHORT TERM GOAL #1   Title improve PROM  to WNL for improved motion (04/11/16)   Baseline achieved within protocol limitations   Status Achieved     PT SHORT TERM GOAL #2   Title independent with initial HEP (04/11/16)   Status Achieved           PT Long Term Goals - 03/21/16 0917      PT LONG TERM GOAL #1   Title [facility] to [facility] for improved function (05/09/16)   Time 8   Period Weeks   Status On-going     PT LONG TERM GOAL #2   Title independent with advanced HEP (05/09/16)   Time 8   Period Weeks   Status On-going     PT LONG TERM GOAL #3   Title improve R shoulder strength to at least 3+/5 for improved function (05/09/16)   Time 8   Period Weeks   Status On-going               Plan - 04/12/16 [id number]    Clinical Impression Statement Pt. reports he is pain free in the R shoulder initially today.  Pt. tolerated all R shoulder PROM and AAROM well with mild pain increase which quickly self-resolved.  Pt. requiring some verbal cueing to avoid painful arc with pulleys today.  Pt. seems to be progressing well per protocol at this point returning to therapy on 8/25.   PT Treatment/Interventions ADLs/Self Care Home  Management;Electrical Stimulation;Cryotherapy;Moist Heat;Ultrasound;Therapeutic exercise;Therapeutic activities;Functional mobility training;Patient/family education;Passive range of motion;Manual techniques;Vasopneumatic Device;Taping;Dry needling   PT Next Visit Plan progress ROM slowly as tolerated, progress per protocol      Patient will benefit from skilled therapeutic intervention in order to improve the following deficits and impairments:  Decreased strength, Pain, Impaired UE functional use, Decreased range of motion, Postural dysfunction  Visit Diagnosis: Pain in right shoulder  Stiffness of right shoulder, not elsewhere classified  Abnormal posture     Problem List Patient Active Problem List   Diagnosis Date Noted  . 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 Impingement syndrome of right shoulder 08/07/2014  . Pain in the chest 02/09/2014  . Shortness of breath 02/09/2014  . Gastroesophageal reflux disease without esophagitis 02/09/2014  . Other and unspecified hyperlipidemia 09/08/2013  . Hereditary and idiopathic peripheral neuropathy 07/22/2013  . Allergic rhinitis, cause unspecified 07/14/2013  . Pain in joint, pelvic region and thigh 07/14/2013  . Elevated blood pressure 05/11/2013  . Insomnia 05/11/2013  . BPH (benign prostatic hypertrophy) 05/11/2013  . Helicobacter pylori (H. pylori) 02/14/2013  . Irritable bowel syndrome 02/14/2013  . Encounter for therapeutic drug monitoring 02/09/2013  . GERD (gastroesophageal reflux disease) 02/09/2013    Kermit BaloMicah Avalyn Molino, PTA 04/12/2016, 6:51 PM  [facility] [address]  [address] [place], [place], [address] Phone: [phone]   Fax:  [phone]  Name: Jon Bush MRN: [id number] Date of Birth: 06/30/1946

## 2016-04-15 ENCOUNTER — Ambulatory Visit: Payer: Medicare Other | Admitting: Physical Therapy

## 2016-04-15 DIAGNOSIS — M25611 Stiffness of right shoulder, not elsewhere classified: Secondary | ICD-10-CM

## 2016-04-15 DIAGNOSIS — M25511 Pain in right shoulder: Secondary | ICD-10-CM | POA: Diagnosis not present

## 2016-04-15 DIAGNOSIS — R293 Abnormal posture: Secondary | ICD-10-CM

## 2016-04-15 NOTE — Therapy (Signed)
[facility] [address]  [address] [place], [place], [address] Phone: [phone]   Fax:  [phone]  Physical Therapy Treatment  Patient Details  Name: Jon Bush MRN: [id number] Date of Birth: [birth year] Referring Provider: Dr. Francena Hanly   Encounter Date: 04/15/2016      PT End of Session - 04/15/16 1010    Visit Number 8   Number of Visits 16   Date for PT Re-Evaluation 05/09/16   PT Start Time 0925   PT Stop Time 1020   PT Time Calculation (min) 55 min   Activity Tolerance Patient tolerated treatment well   Behavior During Therapy [facility] for tasks assessed/performed      Past Medical History:  Diagnosis Date  . Amaurosis fugax of left eye   . Anemia   . BPH (benign prostatic hyperplasia)   . Central retinal vein occlusion of left eye    (Left) Dr. Guy Begin [facility]); Dr. Memory Argue [facility])   . Enlarged prostate   . H. pylori infection 02/14/2013  . High cholesterol   . Hypertension   . IBS (irritable bowel syndrome)   . Kidney stone   . Kidney stones   . Macular hole of left eye 2005  . Migraine headache   . Retinal detachment 2005  . Sleep apnea    cpap    Past Surgical History:  Procedure Laterality Date  . HERNIA REPAIR    . KIDNEY STONE SURGERY    . LITHOTRIPSY    . RETINAL DETACHMENT SURGERY    . REVERSE SHOULDER ARTHROPLASTY Right 02/18/2016  . REVERSE SHOULDER ARTHROPLASTY Right 02/18/2016   Procedure: RIGHT REVERSE SHOULDER ARTHROPLASTY;  Surgeon: Francena Hanly, MD;  Location: [facility];  Service: Orthopedics;  Laterality: Right;    There were no vitals filed for this visit.      Subjective Assessment - 04/15/16 6962    Subjective MD appointment went well; PA pleased with progress.  Not having a lot of pain today.   Limitations Lifting;House hold activities   Patient Stated Goals regain UE use as much as possible; decrease pain   Currently in Pain? Yes   Pain Score 3     Pain Location Shoulder   Pain Orientation Right   Pain Descriptors / Indicators Aching   Pain Type Surgical pain   Pain Onset More than a month ago   Pain Frequency Intermittent   Aggravating Factors  activity   Pain Relieving Factors rest, immobilization, ice, support                         [facility] Adult PT Treatment/Exercise - 04/15/16 0927      Shoulder Exercises: Supine   Flexion Right;20 reps;Weights   Shoulder Flexion Weight (lbs) 1     Shoulder Exercises: Seated   Flexion 10 reps;Right   Flexion Weight (lbs) 1   Flexion Limitations with eccentric holds   Abduction Right;10 reps   ABduction Weight (lbs) 1   ABduction Limitations with eccentric holds     Shoulder Exercises: Sidelying   External Rotation Limitations attempted but pt compensating with abduction - anticipate this will not change due to surgery   ABduction Right;10 reps   ABduction Limitations mild AA   Other Sidelying Exercises R scap retraction 10x10"     Shoulder Exercises: Standing   Flexion Limitations forward flexion x 10 reps with yellow band (90 degrees elbow flex to 90 degrees shoulder  flexion with full elbow extension)   Extension Right;10 reps;Theraband   Theraband Level (Shoulder Extension) Level 1 (Yellow)   Retraction Both;10 reps;Theraband   Theraband Level (Shoulder Retraction) Level 2 (Red)   Retraction Limitations 10 sec hold     Shoulder Exercises: Pulleys   Flexion 3 minutes   ABduction 3 minutes   ABduction Limitations scaption     Shoulder Exercises: Therapy Ball   Flexion 10 reps   Flexion Limitations 10 sec hold; seated   ABduction 10 reps   ABduction Limitations 10 sec hold; seated     Shoulder Exercises: ROM/Strengthening   [place] 1# cuff weight flexion and abdct x 10     Vasopneumatic   Number Minutes Vasopneumatic  10 minutes   Vasopnuematic Location  Shoulder   Vasopneumatic Pressure Low   Vasopneumatic Temperature  max cold     Manual Therapy    Manual Therapy Passive ROM   Passive ROM er, flexion, abdct to pt tolerance                  PT Short Term Goals - 04/07/16 1247      PT SHORT TERM GOAL #1   Title improve PROM to WNL for improved motion (04/11/16)   Baseline achieved within protocol limitations   Status Achieved     PT SHORT TERM GOAL #2   Title independent with initial HEP (04/11/16)   Status Achieved           PT Long Term Goals - 03/21/16 0917      PT LONG TERM GOAL #1   Title [facility] improved function (05/09/16)   Time 8   Period Weeks   Status On-going     PT LONG TERM GOAL #2   Title independent with advanced HEP (05/09/16)   Time 8   Period Weeks   Status On-going     PT LONG TERM GOAL #3   Title improve R shoulder strength to at least 3+/5 for improved function (05/09/16)   Time 8   Period Weeks   Status On-going               Plan - 04/15/16 1017    Clinical Impression Statement Initiated increased strengthening exercises today with pt c/o soreness with exercises.  Pt without any significant protocol limitations at this time now that he is 8 weeks post op so will progress ROM and strength as tolerated.     Rehab Potential Good   PT Treatment/Interventions ADLs/Self Care Home Management;Electrical Stimulation;Cryotherapy;Moist Heat;Ultrasound;Therapeutic exercise;Therapeutic activities;Functional mobility training;Patient/family education;Passive range of motion;Manual techniques;Vasopneumatic Device;Taping;Dry needling   PT Next Visit Plan continue to progress strength and ROM as pt can tolerate; issue strengthening HEP if pt tolerated today's session well.   Consulted and Agree with Plan of Care Patient      Patient will benefit from skilled therapeutic intervention in order to improve the following deficits and impairments:  Decreased strength, Pain, Impaired UE functional use, Decreased range of motion, Postural dysfunction  Visit Diagnosis: Pain in  right shoulder  Stiffness of right shoulder, not elsewhere classified  Abnormal posture     Problem List Patient Active Problem List   Diagnosis Date Noted  . 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 Impingement syndrome of right shoulder 08/07/2014  . Pain in the chest 02/09/2014  . Shortness of breath 02/09/2014  . Gastroesophageal reflux disease without esophagitis 02/09/2014  . Other and unspecified hyperlipidemia 09/08/2013  .  Hereditary and idiopathic peripheral neuropathy 07/22/2013  . Allergic rhinitis, cause unspecified 07/14/2013  . Pain in joint, pelvic region and thigh 07/14/2013  . Elevated blood pressure 05/11/2013  . Insomnia 05/11/2013  . BPH (benign prostatic hypertrophy) 05/11/2013  . Helicobacter pylori (H. pylori) 02/14/2013  . Irritable bowel syndrome 02/14/2013  . Encounter for therapeutic drug monitoring 02/09/2013  . GERD (gastroesophageal reflux disease) 02/09/2013       Clarita Crane, PT, DPT 04/15/16 10:23 AM   [facility] [address]  [address] [place], [place], [address] Phone: [phone]   Fax:  [phone]  Name: Teyon Odette MRN: [id number] Date of Birth: 1945/10/24

## 2016-04-18 ENCOUNTER — Ambulatory Visit: Payer: Medicare Other | Admitting: Physical Therapy

## 2016-04-18 DIAGNOSIS — M25511 Pain in right shoulder: Secondary | ICD-10-CM

## 2016-04-18 DIAGNOSIS — R293 Abnormal posture: Secondary | ICD-10-CM

## 2016-04-18 DIAGNOSIS — M25611 Stiffness of right shoulder, not elsewhere classified: Secondary | ICD-10-CM

## 2016-04-18 NOTE — Therapy (Signed)
[facility] Outpatient Rehabilitation [facility] [address]  [address] [place], [place], [address] Phone: [phone]   Fax:  [phone]  Physical Therapy Treatment  Patient Details  Name: Jon Bush MRN: [id number] Date of Birth: 1946-03-20 Referring Provider: Dr. Francena Hanly   Encounter Date: 04/18/2016      PT End of Session - 04/18/16 1002    Visit Number 9   Number of Visits 16   Date for PT Re-Evaluation 05/09/16   PT Start Time 0844   PT Stop Time 0940   PT Time Calculation (min) 56 min   Activity Tolerance Patient tolerated treatment well   Behavior During Therapy [facility] for tasks assessed/performed      Past Medical History:  Diagnosis Date  . Amaurosis fugax of left eye   . Anemia   . BPH (benign prostatic hyperplasia)   . Central retinal vein occlusion of left eye    (Left) Dr. Guy Begin [facility]); Dr. Memory Argue [facility])   . Enlarged prostate   . H. pylori infection 02/14/2013  . High cholesterol   . Hypertension   . IBS (irritable bowel syndrome)   . Kidney stone   . Kidney stones   . Macular hole of left eye 2005  . Migraine headache   . Retinal detachment 2005  . Sleep apnea    cpap    Past Surgical History:  Procedure Laterality Date  . HERNIA REPAIR    . KIDNEY STONE SURGERY    . LITHOTRIPSY    . RETINAL DETACHMENT SURGERY    . REVERSE SHOULDER ARTHROPLASTY Right 02/18/2016  . REVERSE SHOULDER ARTHROPLASTY Right 02/18/2016   Procedure: RIGHT REVERSE SHOULDER ARTHROPLASTY;  Surgeon: Francena Hanly, MD;  Location: [facility] OR;  Service: Orthopedics;  Laterality: Right;    There were no vitals filed for this visit.      Subjective Assessment - 04/18/16 0844    Subjective R shoulder was a little sore but not bad after Friday   Limitations Lifting;House hold activities   Patient Stated Goals regain UE use as much as possible; decrease pain   Currently in Pain? No/denies                          [facility] Adult PT Treatment/Exercise - 04/18/16 0846      Shoulder Exercises: Supine   Flexion Right;20 reps;Weights   Shoulder Flexion Weight (lbs) 1     Shoulder Exercises: Sidelying   ABduction Right;20 reps;Weights   ABduction Weight (lbs) 1     Shoulder Exercises: Standing   External Rotation Right;20 reps;Theraband;Limitations   Theraband Level (Shoulder External Rotation) Level 1 (Yellow)   External Rotation Limitations limited movement due to surgery   Internal Rotation Right;20 reps;Theraband   Theraband Level (Shoulder Internal Rotation) Level 1 (Yellow)   Extension Right;20 reps;Theraband   Theraband Level (Shoulder Extension) Level 1 (Yellow)     Shoulder Exercises: Pulleys   Flexion 3 minutes   ABduction 3 minutes   ABduction Limitations scaption     Shoulder Exercises: ROM/Strengthening   UBE (Upper Arm Bike) L 1.0 x 4 min; 2' fwd / 2' bwd     Vasopneumatic   Number Minutes Vasopneumatic  15 minutes   Vasopnuematic Location  Shoulder   Vasopneumatic Pressure Low   Vasopneumatic Temperature  max cold     Manual Therapy   Manual Therapy Soft tissue mobilization;Passive ROM   Soft tissue mobilization R pectoralis major and  latissimus   Passive ROM er, flexion, abdct to pt tolerance                PT Education - 04/18/16 1002    Education provided Yes   Education Details strengthening HEP   Person(s) Educated Patient   Methods Explanation;Demonstration;Handout   Comprehension Verbalized understanding;Returned demonstration;Need further instruction          PT Short Term Goals - 04/07/16 1247      PT SHORT TERM GOAL #1   Title improve PROM to WNL for improved motion (04/11/16)   Baseline achieved within protocol limitations   Status Achieved     PT SHORT TERM GOAL #2   Title independent with initial HEP (04/11/16)   Status Achieved           PT Long Term Goals - 03/21/16 0917      PT LONG TERM GOAL #1   Title improve [facility] to [facility] for  improved function (05/09/16)   Time 8   Period Weeks   Status On-going     PT LONG TERM GOAL #2   Title independent with advanced HEP (05/09/16)   Time 8   Period Weeks   Status On-going     PT LONG TERM GOAL #3   Title improve R shoulder strength to at least 3+/5 for improved function (05/09/16)   Time 8   Period Weeks   Status On-going               Plan - 04/18/16 1003    Clinical Impression Statement Pt tolerated strengthening exercises well last session with minimal soreness.  Continues to report pain with all manual therapy mostly due to muscle tightness, educated on benefits of manual therapy and that some discomfort is to be expected.  Issued HEP for strengthening.   PT Treatment/Interventions ADLs/Self Care Home Management;Electrical Stimulation;Cryotherapy;Moist Heat;Ultrasound;Therapeutic exercise;Therapeutic activities;Functional mobility training;Patient/family education;Passive range of motion;Manual techniques;Vasopneumatic Device;Taping;Dry needling   PT Next Visit Plan review HEP, progress ROM and strength as tolerated   Consulted and Agree with Plan of Care Patient      Patient will benefit from skilled therapeutic intervention in order to improve the following deficits and impairments:     Visit Diagnosis: Pain in right shoulder  Stiffness of right shoulder, not elsewhere classified  Abnormal posture     Problem List Patient Active Problem List   Diagnosis Date Noted  . 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 Impingement syndrome of right shoulder 08/07/2014  . Pain in the chest 02/09/2014  . Shortness of breath 02/09/2014  . Gastroesophageal reflux disease without esophagitis 02/09/2014  . Other and unspecified hyperlipidemia 09/08/2013  . Hereditary and idiopathic peripheral neuropathy 07/22/2013  . Allergic rhinitis, cause unspecified 07/14/2013  . Pain in joint, pelvic region and thigh 07/14/2013  . Elevated blood pressure  05/11/2013  . Insomnia 05/11/2013  . BPH (benign prostatic hypertrophy) 05/11/2013  . Helicobacter pylori (H. pylori) 02/14/2013  . Irritable bowel syndrome 02/14/2013  . Encounter for therapeutic drug monitoring 02/09/2013  . GERD (gastroesophageal reflux disease) 02/09/2013       Clarita CraneStephanie F Denisia Harpole, PT, DPT 04/18/16 10:06 AM    [facility] [place] [facility] [address]  [address] [place], [place], [address] Phone: [phone]   Fax:  [phone]  Name: Jon EmeryRichard Bush MRN: [id number] Date of Birth: Feb 18, 1946

## 2016-04-18 NOTE — Patient Instructions (Addendum)
Weight Exercise: Flexion    Lie on back, _1___ lb weight in right hand.. Arm straight as possible, lower weight up and overhead as far as possible. Repeat __20__ times. Do _2___ sessions per day.   http://gt2.exer.us/79   Copyright  VHI. All rights reserved.      Abduction - Side-Lying (Dumbbell)      Lie with neck supported, right arm on hip. Lift straight arm toward ceiling. Repeat _20___ times per set. Do __1__ sets per session. Do __2__ sessions per week. Use __1__ lb weight.   Copyright  VHI. All rights reserved.    Strengthening: Resisted Internal Rotation   Hold tubing in right hand, elbow at side and forearm out. Rotate forearm in across body. Repeat __20__ times per set. Do _1___ sets per session. Do __2__ sessions per day.  http://orth.exer.us/830   Copyright  VHI. All rights reserved.   Strengthening: Resisted External Rotation   Hold tubing in right hand, elbow at side and forearm across body. Rotate forearm out.  Go as far as you can without rotating your body! Repeat __20__ times per set. Do __1__ sets per session. Do __2__ sessions per day.  http://orth.exer.us/828   Copyright  VHI. All rights reserved.     Strengthening: Resisted Extension   Hold tubing in right hand, arm forward. Pull arm back, elbow straight. Repeat _20___ times per set. Do __1__ sets per session. Do __2__ sessions per day.  http://orth.exer.us/832   Copyright  VHI. All rights reserved.

## 2016-04-20 ENCOUNTER — Ambulatory Visit: Payer: Medicare Other

## 2016-04-20 DIAGNOSIS — M25611 Stiffness of right shoulder, not elsewhere classified: Secondary | ICD-10-CM

## 2016-04-20 DIAGNOSIS — M25511 Pain in right shoulder: Secondary | ICD-10-CM | POA: Diagnosis not present

## 2016-04-20 DIAGNOSIS — R293 Abnormal posture: Secondary | ICD-10-CM

## 2016-04-20 NOTE — Therapy (Addendum)
[facility] Outpatient Rehabilitation [facility] [address]  [address] [place], [place], [address] Phone: [phone]   Fax:  [phone]  Physical Therapy Treatment  Patient Details  Name: Jon Bush MRN: [id number] Date of Birth: [birth year] Referring Provider: Dr. Francena Hanly   Encounter Date: 04/20/2016      PT End of Session - 04/20/16 0947    Visit Number 10   Number of Visits 16   Date for PT Re-Evaluation 05/09/16   PT Start Time 0935   PT Stop Time 1027   PT Time Calculation (min) 52 min   Activity Tolerance Patient tolerated treatment well   Behavior During Therapy [facility] for tasks assessed/performed      Past Medical History:  Diagnosis Date  . Amaurosis fugax of left eye   . Anemia   . BPH (benign prostatic hyperplasia)   . Central retinal vein occlusion of left eye    (Left) Dr. Guy Begin [facility]); Dr. Memory Argue [facility])   . Enlarged prostate   . H. pylori infection 02/14/2013  . High cholesterol   . Hypertension   . IBS (irritable bowel syndrome)   . Kidney stone   . Kidney stones   . Macular hole of left eye 2005  . Migraine headache   . Retinal detachment 2005  . Sleep apnea    cpap    Past Surgical History:  Procedure Laterality Date  . HERNIA REPAIR    . KIDNEY STONE SURGERY    . LITHOTRIPSY    . RETINAL DETACHMENT SURGERY    . REVERSE SHOULDER ARTHROPLASTY Right 02/18/2016  . REVERSE SHOULDER ARTHROPLASTY Right 02/18/2016   Procedure: RIGHT REVERSE SHOULDER ARTHROPLASTY;  Surgeon: Francena Hanly, MD;  Location: [facility] OR;  Service: Orthopedics;  Laterality: Right;    There were no vitals filed for this visit.      Subjective Assessment - 04/20/16 0943    Subjective Pt. reports his R shoulder has been feeling good the past few days.     Patient Stated Goals regain UE use as much as possible; decrease pain   Currently in Pain? Yes   Pain Score 2    Pain Location Shoulder   Pain Orientation Right   Pain  Descriptors / Indicators Aching   Pain Type Surgical pain   Pain Onset More than a month ago   Pain Frequency Intermittent   Multiple Pain Sites No      Today's treatment:  Therex: Pulley's R shoulder flexion x 3' Pulley's R shoulder scaption x 3'  Manual:  R shoulder PROM all directions  Review HEP: L sidelying R shoulder abduction x 10 reps;  Supine R shoulder flexion 1# x 15 reps  Standing R shoulder IR with yellow TB x 10 reps  Standing R shoulder ER with yellow TB x 10 reps Standing R shoulder extension with yellow TB x 10 reps   Therex: Supine B shoulder protraction with 1# x 15 reps each  UBE: 1.0 level, 1 min forwards/71min back  Vasoneumatic device to R shoulder: Seated ~ 30 deg abd, low compression, coldest temp., 10'        [facility] PT Assessment - 04/20/16 1021      Observation/Other Assessments   Focus on Therapeutic Outcomes (FOTO)  56% (44% limitation)                PT Short Term Goals - 04/07/16 1247      PT SHORT TERM GOAL #1   Title  improve PROM to WNL for improved motion (04/11/16)   Baseline achieved within protocol limitations   Status Achieved     PT SHORT TERM GOAL #2   Title independent with initial HEP (04/11/16)   Status Achieved           PT Long Term Goals - 03/21/16 0917      PT LONG TERM GOAL #1   Title improve AROM to [facility] for improved function (05/09/16)   Time 8   Period Weeks   Status On-going     PT LONG TERM GOAL #2   Title independent with advanced HEP (05/09/16)   Time 8   Period Weeks   Status On-going     PT LONG TERM GOAL #3   Title improve R shoulder strength to at least 3+/5 for improved function (05/09/16)   Time 8   Period Weeks   Status On-going               Plan - 04/20/16 0955    Clinical Impression Statement Pt. with initial pain this morning of 2/10 in R shoulder.  This pain increased to 5/10 with sidelying abduction HEP activity however self-resolved well following this.  HEP  activities reviewed today with pt. requiring frequent verbal cueing to perform activities correctly.  Pt. reports he has been performing the updated HEP however would benefit from further review for proper pacing and technique.  Pt. seems to be progressing well per protocol.     PT Treatment/Interventions ADLs/Self Care Home Management;Electrical Stimulation;Cryotherapy;Moist Heat;Ultrasound;Therapeutic exercise;Therapeutic activities;Functional mobility training;Patient/family education;Passive range of motion;Manual techniques;Vasopneumatic Device;Taping;Dry needling   PT Next Visit Plan review HEP, progress ROM and strength as tolerated      Patient will benefit from skilled therapeutic intervention in order to improve the following deficits and impairments:  Decreased strength, Pain, Impaired UE functional use, Decreased range of motion, Postural dysfunction  Visit Diagnosis: Pain in right shoulder  Stiffness of right shoulder, not elsewhere classified  Abnormal posture       G-Codes - 04/20/16 1447    Functional Assessment Tool Used FOTO 44% limited   Functional Limitation Carrying, moving and handling objects   Carrying, Moving and Handling Objects Current Status (H4742(G8984) At least 40 percent but less than 60 percent impaired, limited or restricted   Carrying, Moving and Handling Objects Goal Status (V9563(G8985) At least 20 percent but less than 40 percent impaired, limited or restricted      Problem List Patient Active Problem List   Diagnosis Date Noted  . 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 Impingement syndrome of right shoulder 08/07/2014  . Pain in the chest 02/09/2014  . Shortness of breath 02/09/2014  . Gastroesophageal reflux disease without esophagitis 02/09/2014  . Other and unspecified hyperlipidemia 09/08/2013  . Hereditary and idiopathic peripheral neuropathy 07/22/2013  . Allergic rhinitis, cause unspecified 07/14/2013  . Pain in joint, pelvic  region and thigh 07/14/2013  . Elevated blood pressure 05/11/2013  . Insomnia 05/11/2013  . BPH (benign prostatic hypertrophy) 05/11/2013  . Helicobacter pylori (H. pylori) 02/14/2013  . Irritable bowel syndrome 02/14/2013  . Encounter for therapeutic drug monitoring 02/09/2013  . GERD (gastroesophageal reflux disease) 02/09/2013    Kermit BaloMicah Robel Wuertz, PTA 04/20/2016, 2:48 PM    Clarita CraneStephanie F Matthews, PT, DPT 04/20/16 2:48 PM   [facility] [address]  [address] [place], [place], [address] Phone: [phone]   Fax:  [phone]  Name: Jon EmeryRichard Bush MRN:  [id number] Date of Birth: 20-Apr-1946

## 2016-04-27 ENCOUNTER — Ambulatory Visit: Payer: Medicare Other | Attending: Orthopedic Surgery

## 2016-04-27 DIAGNOSIS — M25611 Stiffness of right shoulder, not elsewhere classified: Secondary | ICD-10-CM | POA: Insufficient documentation

## 2016-04-27 DIAGNOSIS — M25511 Pain in right shoulder: Secondary | ICD-10-CM

## 2016-04-27 DIAGNOSIS — R293 Abnormal posture: Secondary | ICD-10-CM | POA: Diagnosis present

## 2016-04-27 NOTE — Therapy (Signed)
[facility] [address]  [address] [place], [place], [address] Phone: [phone]   Fax:  [phone]  Physical Therapy Treatment  Patient Details  Name: Jon EmeryRichard Lipsky MRN: [id number] Date of Birth: 07/04/1946 Referring Provider: Dr. Francena HanlyKevin Supple   Encounter Date: 04/27/2016      PT End of Session - 04/27/16 1023    Visit Number 11   Number of Visits 16   Date for PT Re-Evaluation 05/09/16   PT Start Time 0935   PT Stop Time 1029   PT Time Calculation (min) 54 min   Activity Tolerance Patient tolerated treatment well   Behavior During Therapy [facility] for tasks assessed/performed      Past Medical History:  Diagnosis Date  . Amaurosis fugax of left eye   . Anemia   . BPH (benign prostatic hyperplasia)   . Central retinal vein occlusion of left eye    (Left) Dr. Guy Beginobert DeVanzo [facility] Dr. Memory Argueraig [facility]   . Enlarged prostate   . H. pylori infection 02/14/2013  . High cholesterol   . Hypertension   . IBS (irritable bowel syndrome)   . Kidney stone   . Kidney stones   . Macular hole of left eye 2005  . Migraine headache   . Retinal detachment 2005  . Sleep apnea    cpap    Past Surgical History:  Procedure Laterality Date  . HERNIA REPAIR    . KIDNEY STONE SURGERY    . LITHOTRIPSY    . RETINAL DETACHMENT SURGERY    . REVERSE SHOULDER ARTHROPLASTY Right 02/18/2016  . REVERSE SHOULDER ARTHROPLASTY Right 02/18/2016   Procedure: RIGHT REVERSE SHOULDER ARTHROPLASTY;  Surgeon: Francena HanlyKevin Supple, MD;  [facility] OR;  Service: Orthopedics;  Laterality: Right;    There were no vitals filed for this visit.      Subjective Assessment - 04/27/16 1022    Subjective Pt. reporting he thinks the R shoulder is slowly improving at this point.     Patient Stated Goals regain UE use as much as possible; decrease pain   Currently in Pain? No/denies   Pain Score 0-No pain   Multiple Pain Sites No      Today's  treatment:  Therex: Pulley's R shoulder flexion x 3' Pulley's R shoulder scaption x 3'  Manual:  R shoulder PROM all directions  ROM testing (PROM only)  Review HEP: Standing R shoulder IR with yellow TB x 10 reps; cues required to avoid trunk rotation Standing R shoulder ER with yellow TB x 10 reps; cues required to avoid trunk rotation Standing R shoulder extension with yellow TB x 10 reps; cues provided for slower pacing Supine R shoulder flexion with 1# x 7 reps; verbal cues required to stay away from painful arc; instructed pt. to increase this to 4x/wk  L sidelying R shoulder abduction 1# x 7 reps; instructed pt. to increase this to 4x/wk   Modalities: Vasoneumatic device to R shoulder: Seated ~ 30 deg abd, low compression, coldest temp., 15'       [facility] PT Assessment - 04/27/16 0957      PROM   Overall PROM Comments all measured supine   PROM Assessment Site Shoulder   Right Shoulder Flexion 115 Degrees   Right Shoulder ABduction 90 Degrees   Right Shoulder Internal Rotation 77 Degrees   Right Shoulder External Rotation 50 Degrees          PT Education - 04/27/16 1032  Education Details Pt. instructed to increase frequency of supine R shoulder flexion with 1# and L sidelying R shoulder abduction with 1# to 4x/wk    Person(s) Educated Patient   Methods Explanation   Comprehension Verbalized understanding          PT Short Term Goals - 04/07/16 1247      PT SHORT TERM GOAL #1   Title improve PROM to WNL for improved motion (04/11/16)   Baseline achieved within protocol limitations   Status Achieved     PT SHORT TERM GOAL #2   Title independent with initial HEP (04/11/16)   Status Achieved           PT Long Term Goals - 03/21/16 0917      PT LONG TERM GOAL #1   Title improve AROM to [facility] for improved function (05/09/16)   Time 8   Period Weeks   Status On-going     PT LONG TERM GOAL #2   Title independent with advanced HEP (05/09/16)   Time  8   Period Weeks   Status On-going     PT LONG TERM GOAL #3   Title improve R shoulder strength to at least 3+/5 for improved function (05/09/16)   Time 8   Period Weeks   Status On-going               Plan - 04/27/16 1024    Clinical Impression Statement Pt. able to demo improved R shoulder PROM in all directions today however still limited by pain with supine R shoulder 1# flexion and abduction with HEP.  Pt. instructed to stay away from painful arc with all HEP activities and to increase these HEP activities frequency to 4x/wk from 2x/wk previously instructed on handout.  Pt. still requires verbal cues throughout therex to decrease pace and substitutions.  Pt. progressing at this point however still at R shoulder PROM: flexion 115dg, abd 90dg, IR 77dg, ER 50dg.  Pt. continues to demo forward rounded shoulders and general poor posture despite verbal cues with treatment.     PT Treatment/Interventions ADLs/Self Care Home Management;Electrical Stimulation;Cryotherapy;Moist Heat;Ultrasound;Therapeutic exercise;Therapeutic activities;Functional mobility training;Patient/family education;Passive range of motion;Manual techniques;Vasopneumatic Device;Taping;Dry needling   PT Next Visit Plan Progress ROM and strength as tolerated      Patient will benefit from skilled therapeutic intervention in order to improve the following deficits and impairments:  Decreased strength, Pain, Impaired UE functional use, Decreased range of motion, Postural dysfunction  Visit Diagnosis: Pain in right shoulder  Stiffness of right shoulder, not elsewhere classified  Abnormal posture     Problem List Patient Active Problem List   Diagnosis Date Noted  . 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 Impingement syndrome of right shoulder 08/07/2014  . Pain in the chest 02/09/2014  . Shortness of breath 02/09/2014  . Gastroesophageal reflux disease without esophagitis 02/09/2014  . Other  and unspecified hyperlipidemia 09/08/2013  . Hereditary and idiopathic peripheral neuropathy 07/22/2013  . Allergic rhinitis, cause unspecified 07/14/2013  . Pain in joint, pelvic region and thigh 07/14/2013  . Elevated blood pressure 05/11/2013  . Insomnia 05/11/2013  . BPH (benign prostatic hypertrophy) 05/11/2013  . Helicobacter pylori (H. pylori) 02/14/2013  . Irritable bowel syndrome 02/14/2013  . Encounter for therapeutic drug monitoring 02/09/2013  . GERD (gastroesophageal reflux disease) 02/09/2013    Kermit Balo, PTA 04/27/2016, 10:39 AM  [facility] [address]  [address] [place], [place], [address] Phone:  [phone]   Fax:  [phone]  Name: Deric Bocock MRN: [id number] Date of Birth: [birth year]

## 2016-04-29 ENCOUNTER — Ambulatory Visit: Payer: Medicare Other | Admitting: Physical Therapy

## 2016-04-29 DIAGNOSIS — M25511 Pain in right shoulder: Secondary | ICD-10-CM

## 2016-04-29 DIAGNOSIS — M25611 Stiffness of right shoulder, not elsewhere classified: Secondary | ICD-10-CM

## 2016-04-29 DIAGNOSIS — R293 Abnormal posture: Secondary | ICD-10-CM

## 2016-04-29 NOTE — Therapy (Signed)
[facility] Outpatient Rehabilitation [facility] [address]  [address] [place], [place], [address] Phone: [phone]   Fax:  [phone]  Physical Therapy Treatment  Patient Details  Name: Jon Bush MRN: [id number] Date of Birth: 25-Oct-1945 Referring Provider: Dr. Francena Hanly   Encounter Date: 04/29/2016      PT End of Session - 04/29/16 1010    Visit Number 12   Number of Visits 16   Date for PT Re-Evaluation 05/09/16   PT Start Time 0930   PT Stop Time 1024   PT Time Calculation (min) 54 min   Activity Tolerance Patient tolerated treatment well   Behavior During Therapy [facility] for tasks assessed/performed      Past Medical History:  Diagnosis Date  . Amaurosis fugax of left eye   . Anemia   . BPH (benign prostatic hyperplasia)   . Central retinal vein occlusion of left eye    (Left) Dr. Guy Begin [facility]); Dr. Memory Argue [facility])   . Enlarged prostate   . H. pylori infection 02/14/2013  . High cholesterol   . Hypertension   . IBS (irritable bowel syndrome)   . Kidney stone   . Kidney stones   . Macular hole of left eye 2005  . Migraine headache   . Retinal detachment 2005  . Sleep apnea    cpap    Past Surgical History:  Procedure Laterality Date  . HERNIA REPAIR    . KIDNEY STONE SURGERY    . LITHOTRIPSY    . RETINAL DETACHMENT SURGERY    . REVERSE SHOULDER ARTHROPLASTY Right 02/18/2016  . REVERSE SHOULDER ARTHROPLASTY Right 02/18/2016   Procedure: RIGHT REVERSE SHOULDER ARTHROPLASTY;  Surgeon: Francena Hanly, MD;  Location: [facility] OR;  Service: Orthopedics;  Laterality: Right;    There were no vitals filed for this visit.      Subjective Assessment - 04/29/16 0929    Subjective "I've had 3 real ouches."  Happened with reaching and scratching top of head.   Patient Stated Goals regain UE use as much as possible; decrease pain   Currently in Pain? No/denies                         [facility] Adult PT  Treatment/Exercise - 04/29/16 0931      Shoulder Exercises: Supine   Protraction Right;20 reps;Weights   Protraction Weight (lbs) 2   Horizontal ABduction Right;Theraband;10 reps   Theraband Level (Shoulder Horizontal ABduction) Level 2 (Red)   Other Supine Exercises isometric horizontal abdct/addct x 10 bil   Other Supine Exercises horizontal addct 2# x 10 on R     Shoulder Exercises: Standing   External Rotation Limitations limited movement due to surgery, reviewed this exercise with pt   Internal Rotation Right;20 reps;Theraband   Theraband Level (Shoulder Internal Rotation) Level 1 (Yellow)   Flexion 20 reps;Weights   Shoulder Flexion Weight (lbs) 1   Flexion Limitations against 1/2 foam roll   ABduction Right;20 reps;Weights   Shoulder ABduction Weight (lbs) 1   ABduction Limitations to 40 degrees; on 1/2 foam roll   Extension Right;20 reps;Theraband   Theraband Level (Shoulder Extension) Level 1 (Yellow)     Shoulder Exercises: Pulleys   Flexion 3 minutes   ABduction 3 minutes   ABduction Limitations scaption     Shoulder Exercises: ROM/Strengthening   UBE (Upper Arm Bike) L 1.0 x 4 min; 2' fwd / 2' bwd   Wall [place] 2#  cuff weight; flexion, circles (no weight) x 20 each     Vasopneumatic   Number Minutes Vasopneumatic  15 minutes   Vasopnuematic Location  Shoulder   Vasopneumatic Pressure Low   Vasopneumatic Temperature  max cold                  PT Short Term Goals - 04/07/16 1247      PT SHORT TERM GOAL #1   Title improve PROM to WNL for improved motion (04/11/16)   Baseline achieved within protocol limitations   Status Achieved     PT SHORT TERM GOAL #2   Title independent with initial HEP (04/11/16)   Status Achieved           PT Long Term Goals - 03/21/16 0917      PT LONG TERM GOAL #1   Title improve [facility] to [facility] for improved function (05/09/16)   Time 8   Period Weeks   Status On-going     PT LONG TERM GOAL #2   Title independent with  advanced HEP (05/09/16)   Time 8   Period Weeks   Status On-going     PT LONG TERM GOAL #3   Title improve R shoulder strength to at least 3+/5 for improved function (05/09/16)   Time 8   Period Weeks   Status On-going               Plan - 04/29/16 1010    Clinical Impression Statement Pt slowly progressing and tolerating strangthening exercises well today.  Will continue to benefit from PT to maximize function and strength.   PT Treatment/Interventions ADLs/Self Care Home Management;Electrical Stimulation;Cryotherapy;Moist Heat;Ultrasound;Therapeutic exercise;Therapeutic activities;Functional mobility training;Patient/family education;Passive range of motion;Manual techniques;Vasopneumatic Device;Taping;Dry needling   PT Next Visit Plan Progress ROM and strength as tolerated   Consulted and Agree with Plan of Care Patient      Patient will benefit from skilled therapeutic intervention in order to improve the following deficits and impairments:  Decreased strength, Pain, Impaired UE functional use, Decreased range of motion, Postural dysfunction  Visit Diagnosis: Pain in right shoulder  Stiffness of right shoulder, not elsewhere classified  Abnormal posture     Problem List Patient Active Problem List   Diagnosis Date Noted  . 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 Impingement syndrome of right shoulder 08/07/2014  . Pain in the chest 02/09/2014  . Shortness of breath 02/09/2014  . Gastroesophageal reflux disease without esophagitis 02/09/2014  . Other and unspecified hyperlipidemia 09/08/2013  . Hereditary and idiopathic peripheral neuropathy 07/22/2013  . Allergic rhinitis, cause unspecified 07/14/2013  . Pain in joint, pelvic region and thigh 07/14/2013  . Elevated blood pressure 05/11/2013  . Insomnia 05/11/2013  . BPH (benign prostatic hypertrophy) 05/11/2013  . Helicobacter pylori (H. pylori) 02/14/2013  . Irritable bowel syndrome 02/14/2013   . Encounter for therapeutic drug monitoring 02/09/2013  . GERD (gastroesophageal reflux disease) 02/09/2013      Clarita CraneStephanie F Teagan Heidrick, PT, DPT 04/29/16 10:12 AM   [facility] [address]  [address] [place], [place], [address] Phone: [phone]   Fax:  [phone]  Name: Jon Bush MRN: [id number] Date of Birth: [birth year]

## 2016-05-02 ENCOUNTER — Ambulatory Visit: Payer: Medicare Other | Admitting: Physical Therapy

## 2016-05-02 DIAGNOSIS — R293 Abnormal posture: Secondary | ICD-10-CM

## 2016-05-02 DIAGNOSIS — M25511 Pain in right shoulder: Secondary | ICD-10-CM

## 2016-05-02 DIAGNOSIS — M25611 Stiffness of right shoulder, not elsewhere classified: Secondary | ICD-10-CM

## 2016-05-02 NOTE — Therapy (Signed)
[facility] Outpatient Rehabilitation [facility] [address]  [address] [place], [place], [address] Phone: [phone]   Fax:  [phone]  Physical Therapy Treatment  Patient Details  Name: Jon Bush MRN: [id number] Date of Birth: [birth year] Referring Provider: Dr. Francena Hanly   Encounter Date: 05/02/2016      PT End of Session - 05/02/16 1524    Visit Number 13   Number of Visits 16   Date for PT Re-Evaluation 05/09/16   PT Start Time 1445   PT Stop Time 1539   PT Time Calculation (min) 54 min   Activity Tolerance Patient tolerated treatment well   Behavior During Therapy [facility] for tasks assessed/performed      Past Medical History:  Diagnosis Date  . Amaurosis fugax of left eye   . Anemia   . BPH (benign prostatic hyperplasia)   . Central retinal vein occlusion of left eye    (Left) Dr. Guy Begin [facility]); Dr. Memory Argue [facility])   . Enlarged prostate   . H. pylori infection 02/14/2013  . High cholesterol   . Hypertension   . IBS (irritable bowel syndrome)   . Kidney stone   . Kidney stones   . Macular hole of left eye 2005  . Migraine headache   . Retinal detachment 2005  . Sleep apnea    cpap    Past Surgical History:  Procedure Laterality Date  . HERNIA REPAIR    . KIDNEY STONE SURGERY    . LITHOTRIPSY    . RETINAL DETACHMENT SURGERY    . REVERSE SHOULDER ARTHROPLASTY Right 02/18/2016  . REVERSE SHOULDER ARTHROPLASTY Right 02/18/2016   Procedure: RIGHT REVERSE SHOULDER ARTHROPLASTY;  Surgeon: Francena Hanly, MD;  Location: [facility] OR;  Service: Orthopedics;  Laterality: Right;    There were no vitals filed for this visit.      Subjective Assessment - 05/02/16 1446    Subjective shoulder did well over the weekend.  no pain currently   Patient Stated Goals regain UE use as much as possible; decrease pain   Currently in Pain? No/denies                         [facility] Adult PT Treatment/Exercise - 05/02/16  1448      Shoulder Exercises: Supine   Flexion 20 reps;Limitations   Shoulder Flexion Weight (lbs) 4   Flexion Limitations with cane   Other Supine Exercises chest press with cane 4# 2x10     Shoulder Exercises: Standing   Flexion 20 reps;Weights   Shoulder Flexion Weight (lbs) 1   ABduction Limitations eccentric hold at 90 degrees 1# x 10 on R   Retraction Both;10 reps;Theraband   Theraband Level (Shoulder Retraction) Level 3 (Green)     Shoulder Exercises: Pulleys   Flexion 3 minutes   ABduction 3 minutes   ABduction Limitations scaption     Shoulder Exercises: ROM/Strengthening   UBE (Upper Arm Bike) L 1.6 x 4 min; 3' fwd / 3' bwd   Rhythmic Stabilization, Supine CW/CCW x 15; A-Z with red medicine ball     Vasopneumatic   Number Minutes Vasopneumatic  15 minutes   Vasopnuematic Location  Shoulder   Vasopneumatic Pressure Low   Vasopneumatic Temperature  max cold     Manual Therapy   Passive ROM er, flexion, abdct to pt tolerance                  PT  Short Term Goals - 04/07/16 1247      PT SHORT TERM GOAL #1   Title improve PROM to WNL for improved motion (04/11/16)   Baseline achieved within protocol limitations   Status Achieved     PT SHORT TERM GOAL #2   Title independent with initial HEP (04/11/16)   Status Achieved           PT Long Term Goals - 03/21/16 0917      PT LONG TERM GOAL #1   Title [facility] for improved function (05/09/16)   Time 8   Period Weeks   Status On-going     PT LONG TERM GOAL #2   Title independent with advanced HEP (05/09/16)   Time 8   Period Weeks   Status On-going     PT LONG TERM GOAL #3   Title improve R shoulder strength to at least 3+/5 for improved function (05/09/16)   Time 8   Period Weeks   Status On-going               Plan - 05/02/16 1524    Clinical Impression Statement Pt slowly progressing towards goals, but continues to demonstrate difficulty with strengthening exercises.   Anticipate renewal after next visit to continue to improve strength and function.   PT Treatment/Interventions ADLs/Self Care Home Management;Electrical Stimulation;Cryotherapy;Moist Heat;Ultrasound;Therapeutic exercise;Therapeutic activities;Functional mobility training;Patient/family education;Passive range of motion;Manual techniques;Vasopneumatic Device;Taping;Dry needling   PT Next Visit Plan Progress ROM and strength as tolerated; check LTGs and plan to renew x 4 weeks   Consulted and Agree with Plan of Care Patient      Patient will benefit from skilled therapeutic intervention in order to improve the following deficits and impairments:  Decreased strength, Pain, Impaired UE functional use, Decreased range of motion, Postural dysfunction  Visit Diagnosis: Pain in right shoulder  Stiffness of right shoulder, not elsewhere classified  Abnormal posture     Problem List Patient Active Problem List   Diagnosis Date Noted  . 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 Impingement syndrome of right shoulder 08/07/2014  . Pain in the chest 02/09/2014  . Shortness of breath 02/09/2014  . Gastroesophageal reflux disease without esophagitis 02/09/2014  . Other and unspecified hyperlipidemia 09/08/2013  . Hereditary and idiopathic peripheral neuropathy 07/22/2013  . Allergic rhinitis, cause unspecified 07/14/2013  . Pain in joint, pelvic region and thigh 07/14/2013  . Elevated blood pressure 05/11/2013  . Insomnia 05/11/2013  . BPH (benign prostatic hypertrophy) 05/11/2013  . Helicobacter pylori (H. pylori) 02/14/2013  . Irritable bowel syndrome 02/14/2013  . Encounter for therapeutic drug monitoring 02/09/2013  . GERD (gastroesophageal reflux disease) 02/09/2013      Clarita CraneStephanie F Chasitie Passey, PT, DPT 05/02/16 3:27 PM    [facility] [place] [address]  [address] [place], [place], [address] Phone: [phone]   Fax:   [phone]  Name: Jon EmeryRichard Bush MRN: [id number] Date of Birth: [birth year]

## 2016-05-05 ENCOUNTER — Ambulatory Visit: Payer: Medicare Other

## 2016-05-05 DIAGNOSIS — M25511 Pain in right shoulder: Secondary | ICD-10-CM

## 2016-05-05 DIAGNOSIS — R293 Abnormal posture: Secondary | ICD-10-CM

## 2016-05-05 DIAGNOSIS — M25611 Stiffness of right shoulder, not elsewhere classified: Secondary | ICD-10-CM

## 2016-05-05 NOTE — Therapy (Signed)
[facility] [place] [address]  [place], [place], [address] Phone: [phone]   Fax:  [phone]  Physical Therapy Treatment  Patient Details  Name: Jon Bush MRN: [id number] Date of Birth: 23-May-1946 Referring Provider: Dr. Justice Britain   Encounter Date: 05/05/2016      PT End of Session - 05/05/16 0953    Visit Number 14   Number of Visits 16   Date for PT Re-Evaluation 05/09/16   PT Start Time 0936   PT Stop Time 1015   PT Time Calculation (min) 39 min   Activity Tolerance Patient tolerated treatment well   Behavior During Therapy [facility] for tasks assessed/performed      Past Medical History:  Diagnosis Date  . Amaurosis fugax of left eye   . Anemia   . BPH (benign prostatic hyperplasia)   . Central retinal vein occlusion of left eye    (Left) Dr. Fenton Foy [facility]); Dr. Newell [place] [facility])   . Enlarged prostate   . H. pylori infection 02/14/2013  . High cholesterol   . Hypertension   . IBS (irritable bowel syndrome)   . Kidney stone   . Kidney stones   . Macular hole of left eye 2005  . Migraine headache   . Retinal detachment 2005  . Sleep apnea    cpap    Past Surgical History:  Procedure Laterality Date  . HERNIA REPAIR    . KIDNEY STONE SURGERY    . LITHOTRIPSY    . RETINAL DETACHMENT SURGERY    . REVERSE SHOULDER ARTHROPLASTY Right 02/18/2016  . REVERSE SHOULDER ARTHROPLASTY Right 02/18/2016   Procedure: RIGHT REVERSE SHOULDER ARTHROPLASTY;  Surgeon: Justice Britain, MD;  Location: [place];  Service: Orthopedics;  Laterality: Right;    There were no vitals filed for this visit.      Subjective Assessment - 05/05/16 0949    Subjective Pt. pain free initially.    Patient Stated Goals regain UE use as much as possible; decrease pain   Currently in Pain? No/denies   Pain Score 0-No pain   Multiple Pain Sites No        Today's treatment:  Therex: Pulley's R shoulder  flexion x 3' Pulley's R shoulder scaption x 3'  Manual:  R shoulder PROM all directions  Therex: UBE: level 1.5, 3 min forwards/3 min back Supine chest press with wand and 4# x 15 reps  Supine R shoulder rhythmic stabilization 2 x 1 min each   Goal testing  MMT testing  ROM (not performed due to time constraints)  Vasoneumatic compression to R shoulder: seated in 30 dg abduction, low compression, coldest temp., 15 min        [facility] PT Assessment - 05/05/16 1011      Strength   Strength Assessment Site Shoulder   Right/Left Shoulder Right   Right Shoulder Flexion 4-/5   Right Shoulder Extension 3+/5   Right Shoulder ABduction 3+/5   Right Shoulder Internal Rotation 3-/5   Right Shoulder External Rotation 3+/5             PT Short Term Goals - 04/07/16 1247      PT SHORT TERM GOAL #1   Title improve PROM to WNL for improved motion (04/11/16)   Baseline achieved within protocol limitations   Status Achieved     PT SHORT TERM GOAL #2   Title independent with initial HEP (04/11/16)   Status Achieved  PT Long Term Goals - 05/05/16 1005      PT LONG TERM GOAL #1   Title improve AROM to [facility] for improved function (05/09/16)   Time 8   Period Weeks   Status On-going     PT LONG TERM GOAL #2   Title independent with advanced HEP (05/09/16)   Time 8   Period Weeks   Status Partially Met  Pt. still with poor technique and pacing with majority of HEP activities.     PT LONG TERM GOAL #3   Title improve R shoulder strength to at least 3+/5 for improved function (05/09/16)   Time 8   Period Weeks   Status Partially Met  R shoulder strength 3+/5 or greater with exception of R shoulder IR 3-/5               Plan - 05/05/16 0954    Clinical Impression Statement Pt. pain free initially today and was able to demo at least 3+/5 R shoulder strength in all motions with exception of R shoulder IR which was 3-/5.  HEP thoroughly reviewed with pt. in  previous visits however pt. still with poor recall of activities and poor technique and pacing.  Pt. still requires frequent verbal cueing throughout therex for proper technique and pacing.  Pt. progressing well per protocol at this point.       PT Treatment/Interventions ADLs/Self Care Home Management;Electrical Stimulation;Cryotherapy;Moist Heat;Ultrasound;Therapeutic exercise;Therapeutic activities;Functional mobility training;Patient/family education;Passive range of motion;Manual techniques;Vasopneumatic Device;Taping;Dry needling   PT Next Visit Plan Progress ROM and strength as tolerated; check LTGs and plan to renew x 4 weeks      Patient will benefit from skilled therapeutic intervention in order to improve the following deficits and impairments:  Decreased strength, Pain, Impaired UE functional use, Decreased range of motion, Postural dysfunction  Visit Diagnosis: Pain in right shoulder  Stiffness of right shoulder, not elsewhere classified  Abnormal posture     Problem List Patient Active Problem List   Diagnosis Date Noted  . 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 Impingement syndrome of right shoulder 08/07/2014  . Pain in the chest 02/09/2014  . Shortness of breath 02/09/2014  . Gastroesophageal reflux disease without esophagitis 02/09/2014  . Other and unspecified hyperlipidemia 09/08/2013  . Hereditary and idiopathic peripheral neuropathy 07/22/2013  . Allergic rhinitis, cause unspecified 07/14/2013  . Pain in joint, pelvic region and thigh 07/14/2013  . Elevated blood pressure 05/11/2013  . Insomnia 05/11/2013  . BPH (benign prostatic hypertrophy) 05/11/2013  . Helicobacter pylori (H. pylori) 02/14/2013  . Irritable bowel syndrome 02/14/2013  . Encounter for therapeutic drug monitoring 02/09/2013  . GERD (gastroesophageal reflux disease) 02/09/2013    Bess Harvest, PTA 05/05/2016, 1:16 PM  [facility] [address]  [place], [place], [address] Phone: [phone]   Fax:  [phone]  Name: Jon Bush MRN: [id number] Date of Birth: [birth year]

## 2016-05-09 ENCOUNTER — Ambulatory Visit: Payer: Medicare Other | Admitting: Physical Therapy

## 2016-05-09 DIAGNOSIS — M25511 Pain in right shoulder: Secondary | ICD-10-CM

## 2016-05-09 DIAGNOSIS — M25611 Stiffness of right shoulder, not elsewhere classified: Secondary | ICD-10-CM

## 2016-05-09 DIAGNOSIS — R293 Abnormal posture: Secondary | ICD-10-CM

## 2016-05-09 NOTE — Therapy (Signed)
[facility] [address]  [place], [place], [address] Phone: [phone]   Fax:  [phone]  Physical Therapy Treatment  Patient Details  Name: Jon Bush MRN: [id number] Date of Birth: 1946/06/05 Referring Provider: Dr. Justice Britain   Encounter Date: 05/09/2016      PT End of Session - 05/09/16 1010    Visit Number 15   Number of Visits 23   Date for PT Re-Evaluation 05/09/16   PT Start Time 0931   PT Stop Time 1010   PT Time Calculation (min) 39 min   Activity Tolerance Patient tolerated treatment well   Behavior During Therapy [facility] for tasks assessed/performed      Past Medical History:  Diagnosis Date  . Amaurosis fugax of left eye   . Anemia   . BPH (benign prostatic hyperplasia)   . Central retinal vein occlusion of left eye    (Left) Dr. Fenton Foy [facility]); Dr. Newell [facility])   . Enlarged prostate   . H. pylori infection 02/14/2013  . High cholesterol   . Hypertension   . IBS (irritable bowel syndrome)   . Kidney stone   . Kidney stones   . Macular hole of left eye 2005  . Migraine headache   . Retinal detachment 2005  . Sleep apnea    cpap    Past Surgical History:  Procedure Laterality Date  . HERNIA REPAIR    . KIDNEY STONE SURGERY    . LITHOTRIPSY    . RETINAL DETACHMENT SURGERY    . REVERSE SHOULDER ARTHROPLASTY Right 02/18/2016  . REVERSE SHOULDER ARTHROPLASTY Right 02/18/2016   Procedure: RIGHT REVERSE SHOULDER ARTHROPLASTY;  Surgeon: Justice Britain, MD;  Location: [facility];  Service: Orthopedics;  Laterality: Right;    There were no vitals filed for this visit.      Subjective Assessment - 05/09/16 0934    Subjective drove to [place] this weekend; doing well.  Has occasional pain, but resolves quickly   Patient Stated Goals regain UE use as much as possible; decrease pain   Currently in Pain? No/denies            [employer] PT Assessment - 05/09/16 0942       AROM   Right Shoulder Flexion 128 Degrees   Right Shoulder ABduction 108 Degrees   Right Shoulder Internal Rotation 70 Degrees   Right Shoulder External Rotation 35 Degrees     PROM   Right Shoulder Flexion 131 Degrees   Right Shoulder ABduction 124 Degrees   Right Shoulder Internal Rotation 75 Degrees   Right Shoulder External Rotation 50 Degrees     Strength   Right Shoulder Flexion 3/5   Right Shoulder Extension 4/5   Right Shoulder ABduction 3-/5   Right Shoulder Internal Rotation 3/5   Right Shoulder External Rotation 0/5  unlikely to get stronger                     [facility] Adult PT Treatment/Exercise - 05/09/16 0934      Shoulder Exercises: Supine   Flexion Right;10 reps;Weights   Shoulder Flexion Weight (lbs) 2   Flexion Limitations 2 sets     Shoulder Exercises: Sidelying   ABduction Right;10 reps;Weights   ABduction Weight (lbs) 2   ABduction Limitations 2 sets to 90      Shoulder Exercises: Standing   Internal Rotation Right;20 reps;Theraband   Theraband Level (Shoulder Internal Rotation) Level 2 (Red)  Extension Right;20 reps;Theraband   Theraband Level (Shoulder Extension) Level 2 (Red)     Shoulder Exercises: Pulleys   Flexion 3 minutes   ABduction 3 minutes   ABduction Limitations scaption                PT Education - 05/09/16 1009    Education provided Yes   Education Details stop external rotation exercise, progress to red theraband and 2# for supine flex and abdct   Person(s) Educated Patient   Methods Explanation   Comprehension Verbalized understanding          PT Short Term Goals - 04/07/16 1247      PT SHORT TERM GOAL #1   Title improve PROM to WNL for improved motion (04/11/16)   Baseline achieved within protocol limitations   Status Achieved     PT SHORT TERM GOAL #2   Title independent with initial HEP (04/11/16)   Status Achieved           PT Long Term Goals - 05/09/16 1010      PT LONG TERM GOAL  #1   Title improve AROM to [facility] for improved function (05/09/16)   Baseline achieved to [facility] and feel pt has maximized ROM gains at this point   Status Achieved     PT LONG TERM GOAL #2   Title independent with advanced HEP (05/09/16)   Status Achieved     PT LONG TERM GOAL #3   Title improve R shoulder strength in flexion, abduction and ir to at least 3+/5 for improved function (06/06/16)   Time 4   Period Weeks   Status Revised     PT LONG TERM GOAL #4   Title report ability to perform ADLs specifically UB dressing and hygeine without increase in pain (06/06/16)   Time 4   Period Weeks   Status New               Plan - 05/09/16 1012    Clinical Impression Statement Pt has met LTGs #1 and #2.  At this time feel pt has maximized rehab potential for ROM, but continues to demonstrate strength deficts affecting ADLs and function.  Feel active external rotation unlikely to return at this point which is likely due to prior damage and reason for reverse total shoulder.  Focus will be on strengthening within available range to maximize ADLs and overall function.   Rehab Potential Good   PT Frequency 2x / week   PT Duration 4 weeks   PT Treatment/Interventions ADLs/Self Care Home Management;Electrical Stimulation;Cryotherapy;Moist Heat;Ultrasound;Therapeutic exercise;Therapeutic activities;Functional mobility training;Patient/family education;Passive range of motion;Manual techniques;Vasopneumatic Device;Taping;Dry needling   PT Next Visit Plan continue strengthening within available range, focus on strengthening at this point rather than ROM (er unlikely to return so no need to focus on this much)   Consulted and Agree with Plan of Care Patient      Patient will benefit from skilled therapeutic intervention in order to improve the following deficits and impairments:  Decreased strength, Pain, Impaired UE functional use, Decreased range of motion, Postural dysfunction  Visit  Diagnosis: Pain in right shoulder - Plan: PT plan of care cert/re-cert  Stiffness of right shoulder, not elsewhere classified - Plan: PT plan of care cert/re-cert  Abnormal posture - Plan: PT plan of care cert/re-cert     Problem List Patient Active Problem List   Diagnosis Date Noted  . 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 Impingement syndrome of right shoulder  08/07/2014  . Pain in the chest 02/09/2014  . Shortness of breath 02/09/2014  . Gastroesophageal reflux disease without esophagitis 02/09/2014  . Other and unspecified hyperlipidemia 09/08/2013  . Hereditary and idiopathic peripheral neuropathy 07/22/2013  . Allergic rhinitis, cause unspecified 07/14/2013  . Pain in joint, pelvic region and thigh 07/14/2013  . Elevated blood pressure 05/11/2013  . Insomnia 05/11/2013  . BPH (benign prostatic hypertrophy) 05/11/2013  . Helicobacter pylori (H. pylori) 02/14/2013  . Irritable bowel syndrome 02/14/2013  . Encounter for therapeutic drug monitoring 02/09/2013  . GERD (gastroesophageal reflux disease) 02/09/2013       Laureen Abrahams, PT, DPT 05/09/16 10:18 AM    [facility] [address]  [place], [place], [address] Phone: [phone]   Fax:  [phone]  Name: Jon Bush MRN: [id number] Date of Birth: 31-May-1946

## 2016-05-12 ENCOUNTER — Ambulatory Visit: Payer: Medicare Other | Admitting: Physical Therapy

## 2016-05-12 DIAGNOSIS — M25611 Stiffness of right shoulder, not elsewhere classified: Secondary | ICD-10-CM

## 2016-05-12 DIAGNOSIS — M25511 Pain in right shoulder: Secondary | ICD-10-CM

## 2016-05-12 DIAGNOSIS — R293 Abnormal posture: Secondary | ICD-10-CM

## 2016-05-12 NOTE — Therapy (Signed)
[facility] [address]  [address] [place], [place], [address] Phone: [phone]   Fax:  [phone]  Physical Therapy Treatment  Patient Details  Name: Jon Bush MRN: [id number] Date of Birth: 09/15/1945 Referring Provider: Dr. Francena Hanly   Encounter Date: 05/12/2016      PT End of Session - 05/12/16 1015    Visit Number 16   Number of Visits 23   Date for PT Re-Evaluation 05/09/16   PT Start Time 1015   PT Stop Time 1110   PT Time Calculation (min) 55 min   Activity Tolerance Patient tolerated treatment well   Behavior During Therapy [facility] for tasks assessed/performed      Past Medical History:  Diagnosis Date  . Amaurosis fugax of left eye   . Anemia   . BPH (benign prostatic hyperplasia)   . Central retinal vein occlusion of left eye    (Left) Dr. Guy Begin [facility]); Dr. Memory [facility])   . Enlarged prostate   . H. pylori infection 02/14/2013  . High cholesterol   . Hypertension   . IBS (irritable bowel syndrome)   . Kidney stone   . Kidney stones   . Macular hole of left eye 2005  . Migraine headache   . Retinal detachment 2005  . Sleep apnea    cpap    Past Surgical History:  Procedure Laterality Date  . HERNIA REPAIR    . KIDNEY STONE SURGERY    . LITHOTRIPSY    . RETINAL DETACHMENT SURGERY    . REVERSE SHOULDER ARTHROPLASTY Right 02/18/2016  . REVERSE SHOULDER ARTHROPLASTY Right 02/18/2016   Procedure: RIGHT REVERSE SHOULDER ARTHROPLASTY;  Surgeon: Francena Hanly, MD;  Location: [facility];  Service: Orthopedics;  Laterality: Right;    There were no vitals filed for this visit.      Subjective Assessment - 05/12/16 1015    Subjective Pt reports had to take Ibuprofen this morning but no pain currently. Typically taking Ibuprofen 2x/day.   Patient Stated Goals regain UE use as much as possible; decrease pain   Currently in Pain? No/denies                  [facility]  Adult PT Treatment/Exercise - 05/12/16 1015      Shoulder Exercises: Supine   Protraction Right;20 reps;Weights   Protraction Weight (lbs) 2   Horizontal ABduction Right;15 reps;Theraband   Theraband Level (Shoulder Horizontal ABduction) Level 2 (Red)   External Rotation Right;AAROM;10 reps   External Rotation Limitations working on eccentric IR control   Flexion Right;10 reps;Weights   Shoulder Flexion Weight (lbs) 2   Flexion Limitations 2 sets     Shoulder Exercises: Seated   Flexion Right;10 reps   Flexion Weight (lbs) 1   Flexion Limitations using mirror to avoid hiking shoulder   Abduction Right;10 reps   ABduction Weight (lbs) 1   ABduction Limitations with eccentric hold & lowering     Shoulder Exercises: Sidelying   ABduction Right;10 reps;Weights   ABduction Weight (lbs) 2   ABduction Limitations 2 sets to 90      Shoulder Exercises: Standing   Internal Rotation Right;20 reps;Theraband   Theraband Level (Shoulder Internal Rotation) Level 2 (Red)   Extension Right;20 reps;Theraband   Theraband Level (Shoulder Extension) Level 2 (Red)     Shoulder Exercises: Pulleys   Flexion 3 minutes   ABduction 3 minutes   ABduction Limitations scaption     Shoulder  Exercises: ROM/Strengthening   Rhythmic Stabilization, Supine CW/CCW x 15; A-Z with red medicine ball     Vasopneumatic   Number Minutes Vasopneumatic  15 minutes   Vasopnuematic Location  Shoulder   Vasopneumatic Pressure Low   Vasopneumatic Temperature  max cold     Manual Therapy   Manual Therapy Passive ROM   Passive ROM er, flexion, abdct to pt tolerance                  PT Short Term Goals - 04/07/16 1247      PT SHORT TERM GOAL #1   Title improve PROM to WNL for improved motion (04/11/16)   Baseline achieved within protocol limitations   Status Achieved     PT SHORT TERM GOAL #2   Title independent with initial HEP (04/11/16)   Status Achieved           PT Long Term Goals -  05/09/16 1010      PT LONG TERM GOAL #1   Title improve AROM to [facility] for improved function (05/09/16)   Baseline achieved to [facility] and feel pt has maximized ROM gains at this point   Status Achieved     PT LONG TERM GOAL #2   Title independent with advanced HEP (05/09/16)   Status Achieved     PT LONG TERM GOAL #3   Title improve R shoulder strength in flexion, abduction and ir to at least 3+/5 for improved function (06/06/16)   Time 4   Period Weeks   Status Revised     PT LONG TERM GOAL #4   Title report ability to perform ADLs specifically UB dressing and hygeine without increase in pain (06/06/16)   Time 4   Period Weeks   Status New               Plan - 05/12/16 1055    Clinical Impression Statement Pt continues to demonstrate shoulder hiking with most attempts at AROM against gravity and/or strengthening exercises, requiring frequent cues and visual feedback from mirror to reduce substitution as well as engage scapular stabilizers.   Rehab Potential Good   PT Frequency 2x / week   PT Duration 4 weeks   PT Treatment/Interventions ADLs/Self Care Home Management;Electrical Stimulation;Cryotherapy;Moist Heat;Ultrasound;Therapeutic exercise;Therapeutic activities;Functional mobility training;Patient/family education;Passive range of motion;Manual techniques;Vasopneumatic Device;Taping;Dry needling   PT Next Visit Plan continue strengthening within available range, focus on strengthening at this point rather than ROM (er unlikely to return so no need to focus on this much)   Consulted and Agree with Plan of Care Patient      Patient will benefit from skilled therapeutic intervention in order to improve the following deficits and impairments:  Decreased strength, Pain, Impaired UE functional use, Decreased range of motion, Postural dysfunction  Visit Diagnosis: Pain in right shoulder  Stiffness of right shoulder, not elsewhere classified  Abnormal posture     Problem  List Patient Active Problem List   Diagnosis Date Noted  . 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 Impingement syndrome of right shoulder 08/07/2014  . Pain in the chest 02/09/2014  . Shortness of breath 02/09/2014  . Gastroesophageal reflux disease without esophagitis 02/09/2014  . Other and unspecified hyperlipidemia 09/08/2013  . Hereditary and idiopathic peripheral neuropathy 07/22/2013  . Allergic rhinitis, cause unspecified 07/14/2013  . Pain in joint, pelvic region and thigh 07/14/2013  . Elevated blood pressure 05/11/2013  . Insomnia 05/11/2013  . BPH (benign prostatic hypertrophy) 05/11/2013  .  Helicobacter pylori (H. pylori) 02/14/2013  . Irritable bowel syndrome 02/14/2013  . Encounter for therapeutic drug monitoring 02/09/2013  . GERD (gastroesophageal reflux disease) 02/09/2013    Marry GuanJoAnne M Detric Scalisi, PT, MPT 05/12/2016, 10:58 AM  [facility] [address]  [address] [place], [place], [address] Phone: [phone]   Fax:  [phone]  Name: Jon Bush MRN: [id number] Date of Birth: 11-Mar-1946

## 2016-05-16 ENCOUNTER — Ambulatory Visit: Payer: Medicare Other | Admitting: Physical Therapy

## 2016-05-16 DIAGNOSIS — M25511 Pain in right shoulder: Secondary | ICD-10-CM

## 2016-05-16 DIAGNOSIS — R293 Abnormal posture: Secondary | ICD-10-CM

## 2016-05-16 DIAGNOSIS — M25611 Stiffness of right shoulder, not elsewhere classified: Secondary | ICD-10-CM

## 2016-05-16 NOTE — Therapy (Signed)
[facility] [address]  [address] [place], [place], [address] Phone: [phone]   Fax:  [phone]  Physical Therapy Treatment  Patient Details  Name: Jon EmeryRichard Welsch MRN: [id number] Date of Birth: [birth year] Referring Provider: Dr. Francena HanlyKevin Supple   Encounter Date: 05/16/2016      PT End of Session - 05/16/16 1436    Visit Number 17   Number of Visits 23   Date for PT Re-Evaluation 06/06/16   PT Start Time 1402   PT Stop Time 1456   PT Time Calculation (min) 54 min   Activity Tolerance Patient tolerated treatment well   Behavior [facility] for tasks assessed/performed      Past Medical History:  Diagnosis Date  . Amaurosis fugax of left eye   . Anemia   . BPH (benign prostatic hyperplasia)   . Central retinal vein occlusion of left eye    (Left) Dr. Guy Begin[facility]t [facility] Dr. Memory [facility]   . Enlarged prostate   . H. pylori infection 02/14/2013  . High cholesterol   . Hypertension   . IBS (irritable bowel syndrome)   . Kidney stone   . Kidney stones   . Macular hole of left eye 2005  . Migraine headache   . Retinal detachment 2005  . Sleep apnea    cpap    Past Surgical History:  Procedure Laterality Date  . HERNIA REPAIR    . KIDNEY STONE SURGERY    . LITHOTRIPSY    . RETINAL DETACHMENT SURGERY    . REVERSE SHOULDER ARTHROPLASTY Right 02/18/2016  . REVERSE SHOULDER ARTHROPLASTY Right 02/18/2016   Procedure: RIGHT REVERSE SHOULDER ARTHROPLASTY;  Surgeon: Francena HanlyKevin Supple, MD;  [facility]: [facility];  Service: Orthopedics;  Laterality: Right;    There were no vitals filed for this visit.      Subjective Assessment - 05/16/16 1403    Subjective didn't take and ibuprofen last night and he woke up; otherwise he ususally sleeps really well.   Limitations Lifting;House hold activities   Patient Stated Goals regain UE use as much as possible; decrease pain   Currently in Pain? No/denies                          [facility] Treatment/Exercise - 05/16/16 1404      Shoulder Exercises: Supine   Protraction Right;20 reps;Weights   Protraction Weight (lbs) 3   Flexion Right;10 reps;Weights   Shoulder Flexion Weight (lbs) 3   Flexion Limitations 2 sets     Shoulder Exercises: Sidelying   ABduction Right;10 reps;Weights   ABduction Weight (lbs) 3   ABduction Limitations 2 sets to 90      Shoulder Exercises: Standing   Internal Rotation Right;20 reps;Theraband   Theraband Level (Shoulder Internal Rotation) Level 3 (Green)   Flexion Right;20 reps;Theraband   Theraband Level (Shoulder Flexion) Level 2 (Red)   Retraction Both;20 reps;Theraband   Theraband Level (Shoulder Retraction) Level 4 (Blue)   Retraction Limitations 5 sec hold     Shoulder Exercises: Pulleys   Flexion 3 minutes   ABduction 3 minutes   ABduction Limitations scaption     Shoulder Exercises: ROM/Strengthening   UBE (Upper Arm Bike) L 2 x 6 min (3' fwd/3' bwd)   Rhythmic Stabilization, Supine CW/CCW x 15; A-Z with red medicine ball     Vasopneumatic   Number Minutes Vasopneumatic  15 minutes   Vasopnuematic Location  Shoulder   Vasopneumatic Pressure Low   Vasopneumatic Temperature  max cold     Manual Therapy   Passive ROM er, flexion, abdct to pt tolerance                  PT Short Term Goals - 04/07/16 1247      PT SHORT TERM GOAL #1   Title improve PROM to WNL for improved motion (04/11/16)   Baseline achieved within protocol limitations   Status Achieved     PT SHORT TERM GOAL #2   Title independent with initial HEP (04/11/16)   Status Achieved           PT Long Term Goals - 05/09/16 1010      PT LONG TERM GOAL #1   Title improve AROM to [facility] for improved function (05/09/16)   Baseline achieved to [facility] and feel pt has maximized ROM gains at this point   Status Achieved     PT LONG TERM GOAL #2   Title independent with advanced HEP (05/09/16)    Status Achieved     PT LONG TERM GOAL #3   Title improve R shoulder strength in flexion, abduction and ir to at least 3+/5 for improved function (06/06/16)   Time 4   Period Weeks   Status Revised     PT LONG TERM GOAL #4   Title report ability to perform ADLs specifically UB dressing and hygeine without increase in pain (06/06/16)   Time 4   Period Weeks   Status New               Plan - 05/16/16 1436    Clinical Impression Statement Pt tolerated strengthening exercises well; min cues to correct shoulder shrug but anticipate some shrug will be present.  Will continue to benefit from PT to maximize function.   PT Treatment/Interventions ADLs/Self Care Home Management;Electrical Stimulation;Cryotherapy;Moist Heat;Ultrasound;Therapeutic exercise;Therapeutic activities;Functional mobility training;Patient/family education;Passive range of motion;Manual techniques;Vasopneumatic Device;Taping;Dry needling   PT Next Visit Plan send MD note; continue strengthening within available range, focus on strengthening at this point rather than ROM (er unlikely to return so no need to focus on this much)   Consulted and Agree with Plan of Care Patient      Patient will benefit from skilled therapeutic intervention in order to improve the following deficits and impairments:  Decreased strength, Pain, Impaired UE functional use, Decreased range of motion, Postural dysfunction  Visit Diagnosis: Pain in right shoulder  Stiffness of right shoulder, not elsewhere classified  Abnormal posture     Problem List Patient Active Problem List   Diagnosis Date Noted  . 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 Impingement syndrome of right shoulder 08/07/2014  . Pain in the chest 02/09/2014  . Shortness of breath 02/09/2014  . Gastroesophageal reflux disease without esophagitis 02/09/2014  . Other and unspecified hyperlipidemia 09/08/2013  . Hereditary and idiopathic peripheral  neuropathy 07/22/2013  . Allergic rhinitis, cause unspecified 07/14/2013  . Pain in joint, pelvic region and thigh 07/14/2013  . Elevated blood pressure 05/11/2013  . Insomnia 05/11/2013  . BPH (benign prostatic hypertrophy) 05/11/2013  . Helicobacter pylori (H. pylori) 02/14/2013  . Irritable bowel syndrome 02/14/2013  . Encounter for therapeutic drug monitoring 02/09/2013  . GERD (gastroesophageal reflux disease) 02/09/2013    Clarita Crane, PT, DPT 05/16/16 2:43 PM   [facility] [address]  [address] [place], [place], [address] Phone: [phone]  Fax:  [phone]  Name: Pate Aylward MRN: [id number] Date of Birth: 24-Dec-1945

## 2016-05-19 ENCOUNTER — Ambulatory Visit: Payer: Medicare Other | Admitting: Physical Therapy

## 2016-05-19 DIAGNOSIS — M25511 Pain in right shoulder: Secondary | ICD-10-CM | POA: Diagnosis not present

## 2016-05-19 DIAGNOSIS — M25611 Stiffness of right shoulder, not elsewhere classified: Secondary | ICD-10-CM

## 2016-05-19 DIAGNOSIS — R293 Abnormal posture: Secondary | ICD-10-CM

## 2016-05-19 NOTE — Therapy (Signed)
[facility] [address]  [address] [place], [place], [address] Phone: [phone]   Fax:  [phone]  Physical Therapy Treatment  Patient Details  Name: Jon Bush MRN: [id number] Date of Birth: [birth year] Referring Provider: Dr. Francena Hanly  Encounter Date: 05/19/2016      PT End of Session - 05/19/16 0927    Visit Number 18   Number of Visits 23   Date for PT Re-Evaluation 06/06/16   PT Start Time 0845   PT Stop Time 0929   PT Time Calculation (min) 44 min   Activity Tolerance Patient tolerated treatment well   Behavior During Therapy [facility] for tasks assessed/performed      Past Medical History:  Diagnosis Date  . Amaurosis fugax of left eye   . Anemia   . BPH (benign prostatic hyperplasia)   . Central retinal vein occlusion of left eye    (Left) Dr. Guy Begin [facility]); Dr. Memory Argue [facility])   . Enlarged prostate   . H. pylori infection 02/14/2013  . High cholesterol   . Hypertension   . IBS (irritable bowel syndrome)   . Kidney stone   . Kidney stones   . Macular hole of left eye 2005  . Migraine headache   . Retinal detachment 2005  . Sleep apnea    cpap    Past Surgical History:  Procedure Laterality Date  . HERNIA REPAIR    . KIDNEY STONE SURGERY    . LITHOTRIPSY    . RETINAL DETACHMENT SURGERY    . REVERSE SHOULDER ARTHROPLASTY Right 02/18/2016  . REVERSE SHOULDER ARTHROPLASTY Right 02/18/2016   Procedure: RIGHT REVERSE SHOULDER ARTHROPLASTY;  Surgeon: Francena Hanly, MD;  [facility]: [facility] OR;  Service: Orthopedics;  Laterality: Right;    There were no vitals filed for this visit.      Subjective Assessment - 05/19/16 0848    Subjective just a little sore today but didn't take ibuprofen.     Limitations Lifting;House hold activities   Patient Stated Goals regain UE use as much as possible; decrease pain   Currently in Pain? No/denies            [facility] PT Assessment -  05/19/16 0854      Assessment   Medical Diagnosis R RSA    Referring Provider Dr. Francena Hanly   Onset Date/Surgical Date 02/18/16     AROM   Right Shoulder Flexion 130 Degrees   Right Shoulder ABduction 100 Degrees   Right Shoulder Internal Rotation 80 Degrees   Right Shoulder External Rotation 48 Degrees     Strength   Right Shoulder Flexion 3/5   Right Shoulder Extension 4/5   Right Shoulder ABduction 3-/5   Right Shoulder Internal Rotation 3/5   Right Shoulder External Rotation 0/5                     [facility] Adult PT Treatment/Exercise - 05/19/16 0848      Shoulder Exercises: Supine   Protraction Right;20 reps;Weights   Protraction Weight (lbs) 3   Horizontal ABduction Both;20 reps;Theraband   Theraband Level (Shoulder Horizontal ABduction) Level 3 (Green)   Horizontal ABduction Limitations 2# RUE only horizontal adduction   Flexion Right;10 reps;Weights   Shoulder Flexion Weight (lbs) 3   Flexion Limitations 2 sets     Shoulder Exercises: Standing   Internal Rotation Right;20 reps;Theraband   Theraband Level (Shoulder Internal Rotation) Level 3 (Green)   Flexion Right;20  reps   Theraband Level (Shoulder Flexion) --   Shoulder Flexion Weight (lbs) 1   Flexion Limitations counter to low cabinet height   ABduction Right;Weights;10 reps   Shoulder ABduction Weight (lbs) 1   ABduction Limitations scaption; counter to low cabinet height   Retraction Both;20 reps;Theraband   Theraband Level (Shoulder Retraction) Level 4 (Blue)   Retraction Limitations 5 sec hold     Shoulder Exercises: Pulleys   Flexion 3 minutes   ABduction 3 minutes   ABduction Limitations scaption     Shoulder Exercises: ROM/Strengthening   Cybex Row Limitations 15# 2x10 both grips   Rhythmic Stabilization, Supine CW/CCW x 10; A-Z with red medicine ball                  PT Short Term Goals - 04/07/16 1247      PT SHORT TERM GOAL #1   Title improve PROM to WNL for  improved motion (04/11/16)   Baseline achieved within protocol limitations   Status Achieved     PT SHORT TERM GOAL #2   Title independent with initial HEP (04/11/16)   Status Achieved           PT Long Term Goals - 05/09/16 1010      PT LONG TERM GOAL #1   [facility] (05/09/16)   [facility] and feel pt has maximized ROM gains at this point   Status Achieved     PT LONG TERM GOAL #2   Title independent with advanced HEP (05/09/16)   Status Achieved     PT LONG TERM GOAL #3   Title improve R shoulder strength in flexion, abduction and ir to at least 3+/5 for improved function (06/06/16)   Time 4   Period Weeks   Status Revised     PT LONG TERM GOAL #4   Title report ability to perform ADLs specifically UB dressing and hygeine without increase in pain (06/06/16)   Time 4   Period Weeks   Status New               Plan - 05/19/16 [id number]    Clinical Impression Statement Pt slowly progressing and ROM improved in flexion and external rotation.  Will plan to see through POC and transition to home program.   PT Treatment/Interventions ADLs/Self Care Home Management;Electrical Stimulation;Cryotherapy;Moist Heat;Ultrasound;Therapeutic exercise;Therapeutic activities;Functional mobility training;Patient/family education;Passive range of motion;Manual techniques;Vasopneumatic Device;Taping;Dry needling   PT Next Visit Plan continue strengthening within available range, focus on strengthening at this point rather than ROM (er unlikely to return so no need to focus on this much)   Consulted and Agree with Plan of Care Patient      Patient will benefit from skilled therapeutic intervention in order to improve the following deficits and impairments:  Decreased strength, Pain, Impaired UE functional use, Decreased range of motion, Postural dysfunction  Visit Diagnosis: Pain in right shoulder  Stiffness of right shoulder, not  elsewhere classified  Abnormal posture     Problem List Patient Active Problem List   Diagnosis Date Noted  . 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 Impingement syndrome of right shoulder 08/07/2014  . Pain in the chest 02/09/2014  . Shortness of breath 02/09/2014  . Gastroesophageal reflux disease without esophagitis 02/09/2014  . Other and unspecified hyperlipidemia 09/08/2013  . Hereditary and idiopathic peripheral neuropathy 07/22/2013  . Allergic rhinitis, cause unspecified 07/14/2013  . Pain in joint, pelvic region  and thigh 07/14/2013  . Elevated blood pressure 05/11/2013  . Insomnia 05/11/2013  . BPH (benign prostatic hypertrophy) 05/11/2013  . Helicobacter pylori (H. pylori) 02/14/2013  . Irritable bowel syndrome 02/14/2013  . Encounter for therapeutic drug monitoring 02/09/2013  . GERD (gastroesophageal reflux disease) 02/09/2013      Clarita Crane, PT, DPT 05/19/16 9:30 AM    [facility] [address]  [address] [place], [place], [address] Phone: [phone]   Fax:  [phone]  Name: Adante Courington MRN: [id number] Date of Birth: Dec 28, 1945

## 2016-05-23 ENCOUNTER — Ambulatory Visit: Payer: Medicare Other

## 2016-05-26 ENCOUNTER — Ambulatory Visit: Payer: Medicare Other | Attending: Orthopedic Surgery | Admitting: Physical Therapy

## 2016-05-26 DIAGNOSIS — G8929 Other chronic pain: Secondary | ICD-10-CM | POA: Diagnosis present

## 2016-05-26 DIAGNOSIS — M25511 Pain in right shoulder: Secondary | ICD-10-CM | POA: Insufficient documentation

## 2016-05-26 DIAGNOSIS — M25611 Stiffness of right shoulder, not elsewhere classified: Secondary | ICD-10-CM | POA: Diagnosis present

## 2016-05-26 DIAGNOSIS — R293 Abnormal posture: Secondary | ICD-10-CM

## 2016-05-26 NOTE — Therapy (Addendum)
[facility] Outpatient Rehabilitation [facility] [address]  [address] [place], [place], [address] Phone: [phone]   Fax:  [phone]  Physical Therapy Treatment  Patient Details  Name: Jon Bush MRN: [id number] Date of Birth: September 30, 1945 Referring Provider: Dr. Francena Hanly  Encounter Date: 05/26/2016      PT End of Session - 05/26/16 0927    Visit Number 19   Number of Visits 23   Date for PT Re-Evaluation 06/06/16   PT Start Time 0846   PT Stop Time 0942   PT Time Calculation (min) 56 min   Activity Tolerance Patient tolerated treatment well   Behavior During Therapy [facility] for tasks assessed/performed      Past Medical History:  Diagnosis Date  . Amaurosis fugax of left eye   . Anemia   . BPH (benign prostatic hyperplasia)   . Central retinal vein occlusion of left eye    (Left) Dr. Guy Begin [facility]); Dr. Memory Argue [facility])   . Enlarged prostate   . H. pylori infection 02/14/2013  . High cholesterol   . Hypertension   . IBS (irritable bowel syndrome)   . Kidney stone   . Kidney stones   . Macular hole of left eye 2005  . Migraine headache   . Retinal detachment 2005  . Sleep apnea    cpap    Past Surgical History:  Procedure Laterality Date  . HERNIA REPAIR    . KIDNEY STONE SURGERY    . LITHOTRIPSY    . RETINAL DETACHMENT SURGERY    . REVERSE SHOULDER ARTHROPLASTY Right 02/18/2016  . REVERSE SHOULDER ARTHROPLASTY Right 02/18/2016   Procedure: RIGHT REVERSE SHOULDER ARTHROPLASTY;  Surgeon: Francena Hanly, MD;  Location: [facility] OR;  Service: Orthopedics;  Laterality: Right;    There were no vitals filed for this visit.      Subjective Assessment - 05/26/16 0851    Subjective MD pleased with progress; xrays looked good.  MD agreeable to see through POC then d/c to home program.  Stopped taking ibuprofen and is having just a little soreness but no pain.   Limitations Lifting;House hold activities   Patient Stated Goals  regain UE use as much as possible; decrease pain   Currently in Pain? No/denies  "just sore"                         [facility] Adult PT Treatment/Exercise - 05/26/16 0854      Shoulder Exercises: Prone   Retraction Right;20 reps;Weights   Retraction Weight (lbs) 3   Retraction Limitations 3 sec hold   Extension Right;20 reps;Weights   Extension Weight (lbs) 2   Horizontal ABduction 1 Right;20 reps   Horizontal ABduction 1 Limitations limited motion; cues for scap retraction     Shoulder Exercises: Standing   Flexion Right;20 reps   Shoulder Flexion Weight (lbs) 1   Flexion Limitations counter to low cabinet height   ABduction Right;Weights;10 reps   Shoulder ABduction Weight (lbs) 1   ABduction Limitations scaption; counter to low cabinet height   Retraction Limitations TRX 2x10   Other Standing Exercises flexion and scaption wall ladder with 2# cuff weight; 3 sec hold at end range for eccentric strengthening     Shoulder Exercises: Pulleys   Flexion 3 minutes   ABduction 3 minutes   ABduction Limitations scaption     Shoulder Exercises: ROM/Strengthening   UBE (Upper Arm Bike) L 3.5 x 6 min (3'  fwd/3' bwd)   Cybex Row Limitations 15# 2x15 both grips     Vasopneumatic   Number Minutes Vasopneumatic  15 minutes   Vasopnuematic Location  Shoulder   Vasopneumatic Pressure Low   Vasopneumatic Temperature  max cold                  PT Short Term Goals - 04/07/16 1247      PT SHORT TERM GOAL #1   Title improve PROM to WNL for improved motion (04/11/16)   Baseline achieved within protocol limitations   Status Achieved     PT SHORT TERM GOAL #2   Title independent with initial HEP (04/11/16)   Status Achieved           PT Long Term Goals - 05/09/16 1010      PT LONG TERM GOAL #1   Title improve AROM to [facility] for improved function (05/09/16)   Baseline achieved to [facility] and feel pt has maximized ROM gains at this point   Status Achieved     PT  LONG TERM GOAL #2   Title independent with advanced HEP (05/09/16)   Status Achieved     PT LONG TERM GOAL #3   Title improve R shoulder strength in flexion, abduction and ir to at least 3+/5 for improved function (06/06/16)   Time 4   Period Weeks   Status Revised     PT LONG TERM GOAL #4   Title report ability to perform ADLs specifically UB dressing and hygeine without increase in pain (06/06/16)   Time 4   Period Weeks   Status New               Plan - 05/26/16 8119    Clinical Impression Statement Pt tolerated exercises well today; c/o soreness after session so vaso used today.  Anticipate d/c next 3-4 visits.   PT Treatment/Interventions ADLs/Self Care Home Management;Electrical Stimulation;Cryotherapy;Moist Heat;Ultrasound;Therapeutic exercise;Therapeutic activities;Functional mobility training;Patient/family education;Passive range of motion;Manual techniques;Vasopneumatic Device;Taping;Dry needling   PT Next Visit Plan FOTO, gcode; continue strengthening within available range, focus on strengthening at this point rather than ROM (er unlikely to return so no need to focus on this much)   Consulted and Agree with Plan of Care Patient      Patient will benefit from skilled therapeutic intervention in order to improve the following deficits and impairments:  Decreased strength, Pain, Impaired UE functional use, Decreased range of motion, Postural dysfunction  Visit Diagnosis: Chronic right shoulder pain  Stiffness of right shoulder, not elsewhere classified  Abnormal posture     Problem List Patient Active Problem List   Diagnosis Date Noted  . 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 Impingement syndrome of right shoulder 08/07/2014  . Pain in the chest 02/09/2014  . Shortness of breath 02/09/2014  . Gastroesophageal reflux disease without esophagitis 02/09/2014  . Other and unspecified hyperlipidemia 09/08/2013  . Hereditary and idiopathic  peripheral neuropathy 07/22/2013  . Allergic rhinitis, cause unspecified 07/14/2013  . Pain in joint, pelvic region and thigh 07/14/2013  . Elevated blood pressure 05/11/2013  . Insomnia 05/11/2013  . BPH (benign prostatic hypertrophy) 05/11/2013  . Helicobacter pylori (H. pylori) 02/14/2013  . Irritable bowel syndrome 02/14/2013  . Encounter for therapeutic drug monitoring 02/09/2013  . GERD (gastroesophageal reflux disease) 02/09/2013       Clarita Crane, PT, DPT 05/26/16 1:38 PM    [facility] [place] [address]  [address] [place], [place], [address] Phone: [phone]   Fax:  [phone]  Name: Jon Bush MRN: [id number] Date of Birth: [birth year]

## 2016-05-30 ENCOUNTER — Ambulatory Visit: Payer: Medicare Other | Admitting: Physical Therapy

## 2016-05-30 DIAGNOSIS — M25511 Pain in right shoulder: Principal | ICD-10-CM

## 2016-05-30 DIAGNOSIS — R293 Abnormal posture: Secondary | ICD-10-CM

## 2016-05-30 DIAGNOSIS — G8929 Other chronic pain: Secondary | ICD-10-CM

## 2016-05-30 DIAGNOSIS — M25611 Stiffness of right shoulder, not elsewhere classified: Secondary | ICD-10-CM

## 2016-05-30 NOTE — Therapy (Signed)
[facility] Outpatient Rehabilitation [facility] [address]  [address] [place], [place], [address] Phone: [phone]   Fax:  [phone]  Physical Therapy Treatment  Patient Details  Name: Jon Bush MRN: [id number] Date of Birth: 1945-11-20 Referring Provider: Dr. Francena Hanly  Encounter Date: 05/30/2016      PT End of Session - 05/30/16 1007    Visit Number 20   Date for PT Re-Evaluation 06/06/16   PT Start Time 0930   PT Stop Time 1018   PT Time Calculation (min) 48 min   Activity Tolerance Patient tolerated treatment well   Behavior During Therapy [facility] for tasks assessed/performed      Past Medical History:  Diagnosis Date  . Amaurosis fugax of left eye   . Anemia   . BPH (benign prostatic hyperplasia)   . Central retinal vein occlusion of left eye    (Left) Dr. Guy Begin [facility]); Dr. Memory Argue [facility])   . Enlarged prostate   . H. pylori infection 02/14/2013  . High cholesterol   . Hypertension   . IBS (irritable bowel syndrome)   . Kidney stone   . Kidney stones   . Macular hole of left eye 2005  . Migraine headache   . Retinal detachment 2005  . Sleep apnea    cpap    Past Surgical History:  Procedure Laterality Date  . HERNIA REPAIR    . KIDNEY STONE SURGERY    . LITHOTRIPSY    . RETINAL DETACHMENT SURGERY    . REVERSE SHOULDER ARTHROPLASTY Right 02/18/2016  . REVERSE SHOULDER ARTHROPLASTY Right 02/18/2016   Procedure: RIGHT REVERSE SHOULDER ARTHROPLASTY;  Surgeon: Francena Hanly, MD;  Location: [facility] OR;  Service: Orthopedics;  Laterality: Right;    There were no vitals filed for this visit.      Subjective Assessment - 05/30/16 0933    Subjective shoulder doing well; doing exercises consistently   Limitations Lifting;House hold activities   Patient Stated Goals regain UE use as much as possible; decrease pain   Currently in Pain? No/denies            [facility] PT Assessment - 05/30/16 1018      Observation/Other Assessments   Focus on Therapeutic Outcomes (FOTO)  56% (44% limitation)                      [facility] Adult PT Treatment/Exercise - 05/30/16 0934      Shoulder Exercises: Supine   Protraction Right;15 reps;Weights   Protraction Weight (lbs) 3   Protraction Limitations 2 sets   Flexion Right;15 reps;Weights   Shoulder Flexion Weight (lbs) 3   Flexion Limitations 2 sets     Shoulder Exercises: Standing   Internal Rotation Right;15 reps;Theraband   Theraband Level (Shoulder Internal Rotation) Level 4 (Blue)   Internal Rotation Limitations 2 sets   Retraction Both;15 reps;Theraband   Theraband Level (Shoulder Retraction) Level 4 (Blue)   Retraction Limitations 3 sec hold; 2 sets     Shoulder Exercises: Pulleys   Flexion 3 minutes   ABduction 3 minutes   ABduction Limitations scaption     Shoulder Exercises: ROM/Strengthening   UBE (Upper Arm Bike) L 4.0 x 6 min (3' fwd/3' bwd)     Vasopneumatic   Number Minutes Vasopneumatic  10 minutes   Vasopnuematic Location  Shoulder   Vasopneumatic Pressure Low   Vasopneumatic Temperature  max cold     Manual Therapy   Manual Therapy Passive  ROM   Passive ROM er, flexion, abdct to pt tolerance                  PT Short Term Goals - 04/07/16 1247      PT SHORT TERM GOAL #1   Title improve PROM to WNL for improved motion (04/11/16)   Baseline achieved within protocol limitations   Status Achieved     PT SHORT TERM GOAL #2   Title independent with initial HEP (04/11/16)   Status Achieved           PT Long Term Goals - 05/09/16 1010      PT LONG TERM GOAL #1   Title improve [facility]OM to [facility] for improved function (05/09/16)   Baseline achieved to [facility] and feel pt has maximized ROM gains at this point   Status Achieved     PT LONG TERM GOAL #2   Title independent with advanced HEP (05/09/16)   Status Achieved     PT LONG TERM GOAL #3   Title improve R shoulder strength in flexion, abduction  and ir to at least 3+/5 for improved function (06/06/16)   Time 4   Period Weeks   Status Revised     PT LONG TERM GOAL #4   Title report ability to perform ADLs specifically UB dressing and hygeine without increase in pain (06/06/16)   Time 4   Period Weeks   Status New               Plan - 05/30/16 1007    Clinical Impression Statement Pt tolerated session well today; anticipate d/c next visit as pt has achieved maximal rehab potential.   PT Treatment/Interventions ADLs/Self Care Home Management;Electrical Stimulation;Cryotherapy;Moist Heat;Ultrasound;Therapeutic exercise;Therapeutic activities;Functional mobility training;Patient/family education;Passive range of motion;Manual techniques;Vasopneumatic Device;Taping;Dry needling   PT Next Visit Plan check remaining goals; d/c      Patient will benefit from skilled therapeutic intervention in order to improve the following deficits and impairments:  Decreased strength, Pain, Impaired UE functional use, Decreased range of motion, Postural dysfunction  Visit Diagnosis: Chronic right shoulder pain  Stiffness of right shoulder, not elsewhere classified  Abnormal posture       G-Codes - 05/30/16 1018    Functional Assessment Tool Used FOTO 44% limited   Functional Limitation Carrying, moving and handling objects   Carrying, Moving and Handling Objects Current Status ([id number]) At least 40 percent but less than 60 percent impaired, limited or restricted   Carrying, Moving and Handling Objects Goal Status (U0454(G8985) At least 40 percent but less than 60 percent impaired, limited or restricted      Problem List Patient Active Problem List   Diagnosis Date Noted  . 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 Impingement syndrome of right shoulder 08/07/2014  . Pain in the chest 02/09/2014  . Shortness of breath 02/09/2014  . Gastroesophageal reflux disease without esophagitis 02/09/2014  . Other and unspecified  hyperlipidemia 09/08/2013  . Hereditary and idiopathic peripheral neuropathy 07/22/2013  . Allergic rhinitis, cause unspecified 07/14/2013  . Pain in joint, pelvic region and thigh 07/14/2013  . Elevated blood pressure 05/11/2013  . Insomnia 05/11/2013  . BPH (benign prostatic hypertrophy) 05/11/2013  . Helicobacter pylori (H. pylori) 02/14/2013  . Irritable bowel syndrome 02/14/2013  . Encounter for therapeutic drug monitoring 02/09/2013  . GERD (gastroesophageal reflux disease) 02/09/2013       Clarita CraneStephanie F Omaria Plunk, PT, DPT 05/30/16 10:19 AM    [facility]  [place] [address]  [address] [place], [place], [address] Phone: [phone]   Fax:  [phone]  Name: Jon Bush MRN: [id number] Date of Birth: 12-29-1945

## 2016-06-02 ENCOUNTER — Ambulatory Visit: Payer: Medicare Other | Admitting: Physical Therapy

## 2016-06-02 DIAGNOSIS — G8929 Other chronic pain: Secondary | ICD-10-CM

## 2016-06-02 DIAGNOSIS — M25511 Pain in right shoulder: Principal | ICD-10-CM

## 2016-06-02 DIAGNOSIS — M25611 Stiffness of right shoulder, not elsewhere classified: Secondary | ICD-10-CM

## 2016-06-02 DIAGNOSIS — R293 Abnormal posture: Secondary | ICD-10-CM

## 2016-06-02 NOTE — Therapy (Signed)
[facility] [address]  [place], [place], [address] Phone: [phone]   Fax:  [phone]  Physical Therapy Treatment  Patient Details  Name: Jon Bush MRN: [id number] Date of Birth: 1946-08-05 Referring Provider: Dr. Justice Britain  Encounter Date: 06/02/2016      PT End of Session - 06/02/16 0917    Visit Number 21   PT Start Time 0845   PT Stop Time 0917   PT Time Calculation (min) 32 min   Activity Tolerance Patient tolerated treatment well   Behavior During Therapy [facility] for tasks assessed/performed      Past Medical History:  Diagnosis Date  . Amaurosis fugax of left eye   . Anemia   . BPH (benign prostatic hyperplasia)   . Central retinal vein occlusion of left eye    (Left) Dr. Fenton Foy [facility]); Dr. Newell Coral [facility])   . Enlarged prostate   . H. pylori infection 02/14/2013  . High cholesterol   . Hypertension   . IBS (irritable bowel syndrome)   . Kidney stone   . Kidney stones   . Macular hole of left eye 2005  . Migraine headache   . Retinal detachment 2005  . Sleep apnea    cpap    Past Surgical History:  Procedure Laterality Date  . HERNIA REPAIR    . KIDNEY STONE SURGERY    . LITHOTRIPSY    . RETINAL DETACHMENT SURGERY    . REVERSE SHOULDER ARTHROPLASTY Right 02/18/2016  . REVERSE SHOULDER ARTHROPLASTY Right 02/18/2016   Procedure: RIGHT REVERSE SHOULDER ARTHROPLASTY;  Surgeon: Justice Britain, MD;  Location: [place];  Service: Orthopedics;  Laterality: Right;    There were no vitals filed for this visit.      Subjective Assessment - 06/02/16 0850    Subjective feeling pretty good.  reports a little stiffness.  doing ADLs without pain.   Patient Stated Goals regain UE use as much as possible; decrease pain   Currently in Pain? No/denies            [facility] PT Assessment - 06/02/16 0853      Observation/Other Assessments   Focus on Therapeutic Outcomes  (FOTO)  56% (44% limitation)      AROM   Right Shoulder Flexion 135 Degrees   Right Shoulder ABduction 100 Degrees   Right Shoulder Internal Rotation 80 Degrees   Right Shoulder External Rotation 45 Degrees     Strength   Right Shoulder Flexion 3+/5   Right Shoulder Extension 5/5   Right Shoulder ABduction 3+/5   Right Shoulder Internal Rotation 4/5                     [facility] Adult PT Treatment/Exercise - 06/02/16 0851      Self-Care   Self-Care Other Self-Care Comments   Other Self-Care Comments  educated on gym machines safe to use and recommended starting with low weight progressing as tolerated     Shoulder Exercises: Standing   Flexion Right;20 reps   Shoulder Flexion Weight (lbs) 1-2   Flexion Limitations counter to low cabinet height     Shoulder Exercises: ROM/Strengthening   UBE (Upper Arm Bike) L 4.0 x 6 min (3' fwd/3' bwd)                PT Education - 06/02/16 0917    Education provided Yes   Education Details gym program   [employer]) Educated Patient  Methods Handout;Explanation   Comprehension Verbalized understanding          PT Short Term Goals - 04/07/16 1247      PT SHORT TERM GOAL #1   Title improve PROM to WNL for improved motion (04/11/16)   Baseline achieved within protocol limitations   Status Achieved     PT SHORT TERM GOAL #2   Title independent with initial HEP (04/11/16)   Status Achieved           PT Long Term Goals - 06-07-16 0917      PT LONG TERM GOAL #1   Title improve AROM to [facility] for improved function (05/09/16)   Baseline achieved to [facility] and feel pt has maximized ROM gains at this point   Status Achieved     PT LONG TERM GOAL #2   Title independent with advanced HEP (05/09/16)   Status Achieved     PT LONG TERM GOAL #3   Title improve R shoulder strength in flexion, abduction and ir to at least 3+/5 for improved function (06/06/16)   Status Achieved     PT LONG TERM GOAL #4   Title report  ability to perform ADLs specifically UB dressing and hygeine without increase in pain (06/06/16)   Status Achieved               Plan - June 07, 2016 0918    Clinical Impression Statement Pt has met all goals and is ready for d/c.  Still has some residual ROM and strength deficits consistent with reverse total shoulder surgical repair, but overall ADLs are functional without pain.   PT Treatment/Interventions ADLs/Self Care Home Management;Electrical Stimulation;Cryotherapy;Moist Heat;Ultrasound;Therapeutic exercise;Therapeutic activities;Functional mobility training;Patient/family education;Passive range of motion;Manual techniques;Vasopneumatic Device;Taping;Dry needling   PT Next Visit Plan d/c PT   Consulted and Agree with Plan of Care Patient      Patient will benefit from skilled therapeutic intervention in order to improve the following deficits and impairments:  Decreased strength, Pain, Impaired UE functional use, Decreased range of motion, Postural dysfunction  Visit Diagnosis: Chronic right shoulder pain  Stiffness of right shoulder, not elsewhere classified  Abnormal posture       G-Codes - Jun 07, 2016 0919    Functional Assessment Tool Used FOTO 44% limited   Functional Limitation Carrying, moving and handling objects   Carrying, Moving and Handling Objects Goal Status ([id number]) At least 40 percent but less than 60 percent impaired, limited or restricted   Carrying, Moving and Handling Objects Discharge Status [phone]) At least 40 percent but less than 60 percent impaired, limited or restricted      Problem List Patient Active Problem List   Diagnosis Date Noted  . 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 Impingement syndrome of right shoulder 08/07/2014  . Pain in the chest 02/09/2014  . Shortness of breath 02/09/2014  . Gastroesophageal reflux disease without esophagitis 02/09/2014  . Other and unspecified hyperlipidemia 09/08/2013  . Hereditary and  idiopathic peripheral neuropathy 07/22/2013  . Allergic rhinitis, cause unspecified 07/14/2013  . Pain in joint, pelvic region and thigh 07/14/2013  . Elevated blood pressure 05/11/2013  . Insomnia 05/11/2013  . BPH (benign prostatic hypertrophy) 05/11/2013  . Helicobacter pylori (H. pylori) 02/14/2013  . Irritable bowel syndrome 02/14/2013  . Encounter for therapeutic drug monitoring 02/09/2013  . GERD (gastroesophageal reflux disease) 02/09/2013       Laureen Abrahams, PT, DPT 06-07-2016 9:20 AM    [facility] [phone]  [address]  [place], [place], [address] Phone: [phone]   Fax:  [phone]  Name: Aodhan Scheidt MRN: [id number] Date of Birth: 1946/04/24    PHYSICAL THERAPY DISCHARGE SUMMARY  Visits from Start of Care: 21  Current functional level related to goals / functional outcomes: See above   Remaining deficits: See above   Education / Equipment: HEP  Plan: Patient agrees to discharge.  Patient goals were met. Patient is being discharged due to meeting the stated rehab goals.  ?????    Laureen Abrahams, PT, DPT 06/02/16 9:21 AM  [facility] [place], [place] [address]  [phone] (office) [phone] (fax)

## 2016-06-02 NOTE — Patient Instructions (Addendum)
   Machines at the gym:  (Low weight - 5-10 lbs; 2-3 sets of 10 reps)  1. Rows  2. Bicep Curls  3. Tricep Press   *Avoid overhead exercises.*

## 2016-08-31 ENCOUNTER — Ambulatory Visit: Payer: Medicare Other | Attending: Physician Assistant | Admitting: Physical Therapy

## 2016-08-31 DIAGNOSIS — M6281 Muscle weakness (generalized): Secondary | ICD-10-CM | POA: Diagnosis present

## 2016-08-31 DIAGNOSIS — M25551 Pain in right hip: Secondary | ICD-10-CM | POA: Diagnosis not present

## 2016-08-31 NOTE — Patient Instructions (Signed)
Hamstring Step 2    Left foot relaxed, knee straight, other leg bent, foot flat. Raise straight leg further upward to maximal range. Hold _30-45__ seconds. Relax leg completely down. Repeat _3__ times.    Outer Hip Stretch: Reclined IT Band Stretch (Strap)    Strap around opposite foot, pull across only as far as possible with shoulders on mat. Hold for __30-45__ . Repeat __3__ times each leg.  Copyright  VHI. All rights reserved.     Piriformis Stretch    Lying on back, pull right knee toward opposite shoulder. Hold __30__ seconds. Repeat _3___ times.

## 2016-08-31 NOTE — Therapy (Signed)
[facility] [address]  [address] [place], [place], [address] Phone: [phone]   Fax:  [phone]  Physical Therapy Evaluation  Patient Details  Name: Jon Bush MRN: [id number] Date of Birth: 1946-01-29 Referring Provider: Jaquelyn Bitter. [place], [place]  Encounter Date: 08/31/2016      PT End of Session - 08/31/16 1302    Visit Number 1   Number of Visits 12   Date for PT Re-Evaluation 10/12/16   PT Start Time 1010   PT Stop Time 1051   PT Time Calculation (min) 41 min   Activity Tolerance Patient tolerated treatment well   Behavior During Therapy [facility] for tasks assessed/performed      Past Medical History:  Diagnosis Date  . Amaurosis fugax of left eye   . Anemia   . BPH (benign prostatic hyperplasia)   . Central retinal vein occlusion of left eye    (Left) Dr. Guy Begin [facility]); Dr. Memory Argue [facility])   . Enlarged prostate   . H. pylori infection 02/14/2013  . High cholesterol   . Hypertension   . IBS (irritable bowel syndrome)   . Kidney stone   . Kidney stones   . Macular hole of left eye 2005  . Migraine headache   . Retinal detachment 2005  . Sleep apnea    cpap    Past Surgical History:  Procedure Laterality Date  . HERNIA REPAIR    . KIDNEY STONE SURGERY    . LITHOTRIPSY    . RETINAL DETACHMENT SURGERY    . REVERSE SHOULDER ARTHROPLASTY Right 02/18/2016  . REVERSE SHOULDER ARTHROPLASTY Right 02/18/2016   Procedure: RIGHT REVERSE SHOULDER ARTHROPLASTY;  Surgeon: Francena Hanly, MD;  [facility];  Service: Orthopedics;  Laterality: Right;    There were no vitals filed for this visit.       Subjective Assessment - 08/31/16 1010    Subjective About 2-2.5 weeks ago, was cleaning house. Noticed pain in his lower back after he bent down. Has tried taking ibuprofen with liitle relief. Went to MD at [facility] - was given prednisone for 1 week. Pain in primarily in R buttock. Has  tried heat and ice - short relief. Intermittent pain radiation into R leg and foot with forward bending.    Diagnostic tests Xray -    Patient Stated Goals reduce pain   Currently in Pain? Yes   Pain Score 3    Pain Location Buttocks   Pain Orientation Right   Pain Descriptors / Indicators Aching  stabbing pain with bending   Pain Type Acute pain   Pain Radiating Towards intermittent pain radiation into R LE and foot   Pain Onset 1 to 4 weeks ago   Pain Frequency Intermittent   Aggravating Factors  bending, twisting   Pain Relieving Factors ibuprofen, avoiding housework            [facility] PT Assessment - 08/31/16 1017      Assessment   Medical Diagnosis Spondylolisthesis of lumbar region, DDD, buttock pain    Referring Provider Jaquelyn Bitter. [place], PA   Onset Date/Surgical Date --  2 weeks ago   Next MD Visit 09/29/16   Prior Therapy yes - not for this issue     Precautions   Precautions None     Balance Screen   Has the patient fallen in the past 6 months No   Has the patient had a decrease in activity level because  of a fear of falling?  No   Is the patient reluctant to leave their home because of a fear of falling?  No     Home Environment   Living Environment Private residence   Living Arrangements Children   Type of Home House   Home Access Level entry   Home Layout One level   Home Equipment None     Prior Function   Level of Independence Independent   Vocation Retired   AstronomerVocation Requirements Teaches furniture marketing at [employer]   Leisure spend time with grandchildren, travelling     Cognition   Overall Cognitive Status Within Functional Limits for tasks assessed     Observation/Other Assessments   Focus on Therapeutic Outcomes (FOTO)  lumbar Spine: 58 (42% limited, predicted 26% limited)     Posture/Postural Control   Posture/Postural Control Postural limitations   Postural Limitations Rounded Shoulders;Forward head     ROM / Strength   AROM / PROM /  Strength AROM;Strength     AROM   AROM Assessment Site Lumbar   Lumbar Flexion WNL - no pain   Lumbar Extension WNL - no pain   Lumbar - Right Side Bend fingertip to above joint line - no pain   Lumbar - Left Side Bend fingertip to above joint line - no pain   Lumbar - Right Rotation WNL - no pain   Lumbar - Left Rotation WNL - minor pain     Strength   Overall Strength Comments no pain with all MMT   Strength Assessment Site Hip;Knee   Right/Left Hip Right;Left   Right Hip Flexion 4/5   Right Hip Extension 3+/5   Right Hip ABduction 4-/5   Left Hip Flexion 4/5   Left Hip Extension 4-/5   Left Hip ABduction 4/5   Right/Left Knee Right;Left   Right Knee Flexion 4+/5   Right Knee Extension 4+/5   Left Knee Flexion 4+/5   Left Knee Extension 4+/5     Flexibility   Soft Tissue Assessment /Muscle Length yes   Hamstrings B tightness   ITB R tightness   Piriformis R tightness     Special Tests    Special Tests Lumbar;Hip Special Tests   Lumbar Tests Straight Leg Raise   Hip Special Tests  Hip Scouring     Straight Leg Raise   Findings Negative   Side  Right     Hip Scouring   Findings Negative   Side Right                   [facility] Adult PT Treatment/Exercise - 08/31/16 1017      Exercises   Exercises Knee/Hip     Knee/Hip Exercises: Stretches   Passive Hamstring Stretch Right;3 reps;60 seconds   ITB Stretch Right;3 reps;30 seconds   Piriformis Stretch Right;3 reps;30 seconds     Knee/Hip Exercises: Supine   Other Supine Knee/Hip Exercises education for log-rolling technique for supine to sit - poor carryover                PT Education - 08/31/16 1302    Education provided Yes   Education Details exam findings, POC, HEP   Person(s) Educated Patient   Methods Explanation;Demonstration;Handout   Comprehension Verbalized understanding;Returned demonstration;Need further instruction             PT Long Term Goals - 08/31/16 1320       PT LONG TERM GOAL #1   Title Patient to  be independent with advanced HEP (10/12/16)   Status New     PT LONG TERM GOAL #2   Title Patient to improve R LE flexibility of HS, IT band and piriformis with good compliance with stretching independently (10/12/16)   Status New     PT LONG TERM GOAL #3   Title Patient to report ability to return to household work with no increase in pain (10/12/16)   Status New     PT LONG TERM GOAL #4   Title Patient to improve B LE strength to >/= 4+/5 with no increase in pain (10/12/16)   Status New     PT LONG TERM GOAL #5   Title patient to demonstrate supine to sit with pain no greater than 1/10 for greater than 2 weeks (10/12/16)   Status New               Plan - 2016/09/18 1302    Clinical Impression Statement Mr. Buffin is a 71 y/o male presenting to OPPT today for low complexity evaluaton regarding R buttock pain. Patient today with good AROM of lumbar spine with pain only slightly produced with L lumbar rotation. Patient with B tightness of hamstring musculature, R IT band and P priformis as decreased hip strength and pain primarily produced with supine to sit transfer. Patient mildly tender to deep palpation along R glute complex and R posterior thigh. Pateitn issued HEP today with focus on stretching posterior thigh and buttock musculature. Patient to benefit from PT to address the above listed deficits to maximize function.    Rehab Potential Good   PT Frequency 2x / week   PT Duration 6 weeks   PT Treatment/Interventions ADLs/Self Care Home Management;Cryotherapy;Electrical Stimulation;Moist Heat;Ultrasound;Neuromuscular re-education;Balance training;Therapeutic exercise;Therapeutic activities;Functional mobility training;Stair training;Gait training;Patient/family education;Manual techniques;Passive range of motion;Vasopneumatic Device;Taping;Dry needling   PT Next Visit Plan gentle stretching and strengthening (hip)   Consulted and Agree  with Plan of Care Patient      Patient will benefit from skilled therapeutic intervention in order to improve the following deficits and impairments:  Decreased activity tolerance, Decreased range of motion, Decreased strength, Increased muscle spasms, Pain  Visit Diagnosis: Pain in right hip - Plan: PT plan of care cert/re-cert  Muscle weakness (generalized) - Plan: PT plan of care cert/re-cert      G-Codes - 2016-09-18 1557    Functional Assessment Tool Used FOTO: 58 (42% limited)   Functional Limitation Mobility: Walking and moving around   Mobility: Walking and Moving Around Current Status ([id number]) At least 40 percent but less than 60 percent impaired, limited or restricted   Mobility: Walking and Moving Around Goal Status [phone]) At least 20 percent but less than 40 percent impaired, limited or restricted       Problem List Patient Active Problem List   Diagnosis Date Noted  . 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 Impingement syndrome of right shoulder 08/07/2014  . Pain in the chest 02/09/2014  . Shortness of breath 02/09/2014  . Gastroesophageal reflux disease without esophagitis 02/09/2014  . Other and unspecified hyperlipidemia 09/08/2013  . Hereditary and idiopathic peripheral neuropathy 07/22/2013  . Allergic rhinitis, cause unspecified 07/14/2013  . Pain in joint, pelvic region and thigh 07/14/2013  . Elevated blood pressure 05/11/2013  . Insomnia 05/11/2013  . BPH (benign prostatic hypertrophy) 05/11/2013  . Helicobacter pylori (H. pylori) 02/14/2013  . Irritable bowel syndrome 02/14/2013  . Encounter for therapeutic drug monitoring 02/09/2013  . GERD (gastroesophageal reflux disease) 02/09/2013  Kipp Laurence, PT, DPT 08/31/16 4:16 PM   [facility] [address]  [address] [place], [place], [address] Phone: [phone]   Fax:  [phone]  Name: Windel Keziah MRN:  [id number] Date of Birth: 10-Apr-1946

## 2016-09-02 ENCOUNTER — Ambulatory Visit: Payer: Medicare Other

## 2016-09-02 DIAGNOSIS — M6281 Muscle weakness (generalized): Secondary | ICD-10-CM

## 2016-09-02 DIAGNOSIS — M25551 Pain in right hip: Secondary | ICD-10-CM | POA: Diagnosis not present

## 2016-09-02 NOTE — Therapy (Signed)
[facility] [address]  [address] [place], [place], [address] Phone: [phone]   Fax:  [phone]  Physical Therapy Treatment  Patient Details  Name: Jon Bush MRN: [id number] Date of Birth: [birth year] Referring Provider: Jaquelyn Bitter. [place], [place]  Encounter Date: 09/02/2016      PT End of Session - 09/02/16 4010    Visit Number 2   Number of Visits 12   Date for PT Re-Evaluation 10/12/16   PT Start Time 0835  Pt. taken back early    PT Stop Time 0925   PT Time Calculation (min) 50 min   Activity Tolerance Patient tolerated treatment well   Behavior During Therapy [facility] for tasks assessed/performed      Past Medical History:  Diagnosis Date  . Amaurosis fugax of left eye   . Anemia   . BPH (benign prostatic hyperplasia)   . Central retinal vein occlusion of left eye    (Left) Dr. Guy Begin [facility]); Dr. Memory Argue [facility])   . Enlarged prostate   . H. pylori infection 02/14/2013  . High cholesterol   . Hypertension   . IBS (irritable bowel syndrome)   . Kidney stone   . Kidney stones   . Macular hole of left eye 2005  . Migraine headache   . Retinal detachment 2005  . Sleep apnea    cpap    Past Surgical History:  Procedure Laterality Date  . HERNIA REPAIR    . KIDNEY STONE SURGERY    . LITHOTRIPSY    . RETINAL DETACHMENT SURGERY    . REVERSE SHOULDER ARTHROPLASTY Right 02/18/2016  . REVERSE SHOULDER ARTHROPLASTY Right 02/18/2016   Procedure: RIGHT REVERSE SHOULDER ARTHROPLASTY;  Surgeon: Francena Hanly, MD;  Location: [facility] OR;  Service: Orthopedics;  Laterality: Right;    There were no vitals filed for this visit.      Subjective Assessment - 09/02/16 0838    Subjective Pt. reporting he feels the need to review the HEP today.    Patient Stated Goals Reduce pain   Currently in Pain? Yes   Pain Score 3    Pain Location Buttocks   Pain Orientation Right   Pain Descriptors / Indicators  Aching   Pain Type Acute pain   Pain Onset 1 to 4 weeks ago   Pain Frequency Intermittent   Aggravating Factors  bending, twisting, picking up things   Multiple Pain Sites No          [facility] Adult PT Treatment/Exercise - 09/02/16 0858      Knee/Hip Exercises: Stretches   Passive Hamstring Stretch Right;3 reps;60 seconds   Passive Hamstring Stretch Limitations R side; last set with therapist   ITB Stretch Right;3 reps;30 seconds  R side; last set with therapist    Piriformis Stretch Right;3 reps;30 seconds   Piriformis Stretch Limitations R side; last set with therapst      Knee/Hip Exercises: Aerobic   Nustep NuStep: level 5, 8 min      Knee/Hip Exercises: Supine   Bridges Limitations Hooklying bridge 3" x 10 reps   Bridges with [employer] AROM;1 set;10 reps  3" hold   Bridges with Clamshell 10 reps;AROM;Both;3 sets  sustained B hip abd/ER with green TB   Knee Flexion 10 reps;Both;AROM;1 set  3"    Knee Flexion Limitations Hooklying LE mach with green TB around knees 3" x 10 reps      Knee/Hip Exercises: Sidelying   Clams B  clam shell with green TB 3" x 10 reps                PT Education - 09/02/16 0931    Education provided Yes   Education Details Hooklying sustained hip abd/ER with green looped TB around knees, clam shell with green TB, hooklying alternating hip abd/ER with green TB, bridg with pillow squeeze, Hooklying LE brace march with green TB    Person(s) Educated Patient   Methods Explanation;Demonstration;Handout;Verbal cues;Tactile cues   Comprehension Verbalized understanding;Returned demonstration;Verbal cues required;Tactile cues required;Need further instruction             PT Long Term Goals - 09/02/16 0932      PT LONG TERM GOAL #1   Title Patient to be independent with advanced HEP (10/12/16)   Status On-going     PT LONG TERM GOAL #2   Title Patient to improve R LE flexibility of HS, IT band and piriformis with good compliance  with stretching independently (10/12/16)   Status On-going     PT LONG TERM GOAL #3   Title Patient to report ability to return to household work with no increase in pain (10/12/16)   Status On-going     PT LONG TERM GOAL #4   Title Patient to improve B LE strength to >/= 4+/5 with no increase in pain (10/12/16)   Status On-going     PT LONG TERM GOAL #5   Title patient to demonstrate supine to sit with pain no greater than 1/10 for greater than 2 weeks (10/12/16)   Status On-going               Plan - 09/02/16 0925    Clinical Impression Statement Pt. reporting he has been able to get to the flexibility HEP however reports he wishes to review them today.  HEP updated today with hip strengthening activity with green TB.  Despite full review of all therex activities, increased time taken with all activities due to heavy instruction required from therapist for updated strengthening HEP.  Pt. would benefit from further review with this due to poor technique and understanding in future visits.     PT Treatment/Interventions ADLs/Self Care Home Management;Cryotherapy;Electrical Stimulation;Moist Heat;Ultrasound;Neuromuscular re-education;Balance training;Therapeutic exercise;Therapeutic activities;Functional mobility training;Stair training;Gait training;Patient/family education;Manual techniques;Passive range of motion;Vasopneumatic Device;Taping;Dry needling   PT Next Visit Plan HEP review; gentle stretching and strengthening (hip)      Patient will benefit from skilled therapeutic intervention in order to improve the following deficits and impairments:  Decreased activity tolerance, Decreased range of motion, Decreased strength, Increased muscle spasms, Pain  Visit Diagnosis: Pain in right hip  Muscle weakness (generalized)     Problem List Patient Active Problem List   Diagnosis Date Noted  . 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 Impingement syndrome of right  shoulder 08/07/2014  . Pain in the chest 02/09/2014  . Shortness of breath 02/09/2014  . Gastroesophageal reflux disease without esophagitis 02/09/2014  . Other and unspecified hyperlipidemia 09/08/2013  . Hereditary and idiopathic peripheral neuropathy 07/22/2013  . Allergic rhinitis, cause unspecified 07/14/2013  . Pain in joint, pelvic region and thigh 07/14/2013  . Elevated blood pressure 05/11/2013  . Insomnia 05/11/2013  . BPH (benign prostatic hypertrophy) 05/11/2013  . Helicobacter pylori (H. pylori) 02/14/2013  . Irritable bowel syndrome 02/14/2013  . Encounter for therapeutic drug monitoring 02/09/2013  . GERD (gastroesophageal reflux disease) 02/09/2013    Kermit Balo, PTA 09/02/16 12:19 PM  [facility]  [facility] [address]  [address] [place], [place], [address] Phone: [phone]   Fax:  [phone]  Name: Jon Bush MRN: [id number] Date of Birth: [birth year]

## 2016-09-05 ENCOUNTER — Ambulatory Visit: Payer: Medicare Other

## 2016-09-05 DIAGNOSIS — M25551 Pain in right hip: Secondary | ICD-10-CM | POA: Diagnosis not present

## 2016-09-05 DIAGNOSIS — M6281 Muscle weakness (generalized): Secondary | ICD-10-CM

## 2016-09-05 NOTE — Therapy (Signed)
[facility] [address]  [address] [place], [place], [address] Phone: [phone]   Fax:  [phone]  Physical Therapy Treatment  Patient Details  Name: Jon EmeryRichard Yokoyama MRN: [id number] Date of Birth: 07-27-1946 Referring Provider: Jaquelyn BitterStephen J. [place], [place]  Encounter Date: 09/05/2016      PT End of Session - 09/05/16 1410    Visit Number 3   Number of Visits 12   Date for PT Re-Evaluation 10/12/16   PT Start Time 1402   PT Stop Time 1445   PT Time Calculation (min) 43 min   Activity Tolerance Patient tolerated treatment well   Behavior During Therapy [facility] for tasks assessed/performed      Past Medical History:  Diagnosis Date  . Amaurosis fugax of left eye   . Anemia   . BPH (benign prostatic hyperplasia)   . Central retinal vein occlusion of left eye    (Left) Dr. Guy Beginobert DeVanzo [facility] Dr. Memory [facility]gueraig [facility])   . Enlarged prostate   . H. pylori infection 02/14/2013  . High cholesterol   . Hypertension   . IBS (irritable bowel syndrome)   . Kidney stone   . Kidney stones   . Macular hole of left eye 2005  . Migraine headache   . Retinal detachment 2005  . Sleep apnea    cpap    Past Surgical History:  Procedure Laterality Date  . HERNIA REPAIR    . KIDNEY STONE SURGERY    . LITHOTRIPSY    . RETINAL DETACHMENT SURGERY    . REVERSE SHOULDER ARTHROPLASTY Right 02/18/2016  . REVERSE SHOULDER ARTHROPLASTY Right 02/18/2016   Procedure: RIGHT REVERSE SHOULDER ARTHROPLASTY;  Surgeon: Francena HanlyKevin Supple, MD;  Location: [facility] OR;  Service: Orthopedics;  Laterality: Right;    There were no vitals filed for this visit.      Subjective Assessment - 09/05/16 1406    Subjective Pt. reporting he rode the stationary bike today at the gym for 20 min without pain.  Pt. reporting he feels benefit from therapy at this point.     Patient Stated Goals Reduce pain   Currently in Pain? No/denies   Pain Score 0-No pain   Multiple Pain Sites No           [facility] Adult PT Treatment/Exercise - 09/05/16 1426      Knee/Hip Exercises: Stretches   Passive Hamstring Stretch Right;60 seconds;2 reps   ITB Stretch 2 reps;30 seconds;Right   Piriformis Stretch 2 reps;30 seconds;Right     Knee/Hip Exercises: Aerobic   Nustep NuStep: level 5, 6 min      Knee/Hip Exercises: Seated   Other Seated Knee/Hip Exercises Seated lumbar stretch x 3 directions 5" x 5 reps      Knee/Hip Exercises: Supine   Bridges Limitations Hooklying bridge 3" x 15 reps   Bridges with Harley-[employer]sonBall Squeeze AROM;1 set;15 reps   Bridges with Clamshell AROM;Both;3 sets;15 reps   Knee Flexion 10 reps;Both;AROM;1 set   Knee Flexion Limitations Hooklying LE mach with green TB around knees 3" x 10 reps      Shoulder Exercises: ROM/Strengthening   Cybex Row 15 reps  3" hold   Cybex Row Limitations 15#                PT Education - 09/05/16 1654    Education provided Yes   Education Details counter sqaut    Person(s) Educated Patient   Methods Explanation;Demonstration;Verbal cues;Handout   Comprehension Verbalized  understanding;Returned demonstration;Verbal cues required;Tactile cues required;Need further instruction             PT Long Term Goals - 09/02/16 0932      PT LONG TERM GOAL #1   Title Patient to be independent with advanced HEP (10/12/16)   Status On-going     PT LONG TERM GOAL #2   Title Patient to improve R LE flexibility of HS, IT band and piriformis with good compliance with stretching independently (10/12/16)   Status On-going     PT LONG TERM GOAL #3   Title Patient to report ability to return to household work with no increase in pain (10/12/16)   Status On-going     PT LONG TERM GOAL #4   Title Patient to improve B LE strength to >/= 4+/5 with no increase in pain (10/12/16)   Status On-going     PT LONG TERM GOAL #5   Title patient to demonstrate supine to sit with pain no greater than 1/10 for greater  than 2 weeks (10/12/16)   Status On-going               Plan - 09/05/16 1804    Clinical Impression Statement Pt. with good recall and demonstration of hip strengthening HEP today and reports daily adherence.  Pt. reporting he was able to ride recumbent bike at local gym for 20 min today without low back pain.  Pt. reporting attempting full row, which caused LBP at gym thus proper technique with low row, reviewed with pt. today.  Pt. tolerated progression of all lumbopelvic strengthening activity and initiation of mid back strengthening activity well today.  Only LBP today was with sit<>stand transition.  LOG rolling technique reviewed with pt. today with pt. able to demo proper technique.  Pt. will continues to benefit from further skilled therapy to improve LE strength and improve function.     PT Treatment/Interventions ADLs/Self Care Home Management;Cryotherapy;Electrical Stimulation;Moist Heat;Ultrasound;Neuromuscular re-education;Balance training;Therapeutic exercise;Therapeutic activities;Functional mobility training;Stair training;Gait training;Patient/family education;Manual techniques;Passive range of motion;Vasopneumatic Device;Taping;Dry needling   PT Next Visit Plan Gentle stretching and strengthening (hip)      Patient will benefit from skilled therapeutic intervention in order to improve the following deficits and impairments:  Decreased activity tolerance, Decreased range of motion, Decreased strength, Increased muscle spasms, Pain  Visit Diagnosis: Pain in right hip  Muscle weakness (generalized)     Problem List Patient Active Problem List   Diagnosis Date Noted  . 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 Impingement syndrome of right shoulder 08/07/2014  . Pain in the chest 02/09/2014  . Shortness of breath 02/09/2014  . Gastroesophageal reflux disease without esophagitis 02/09/2014  . Other and unspecified hyperlipidemia 09/08/2013  . Hereditary  and idiopathic peripheral neuropathy 07/22/2013  . Allergic rhinitis, cause unspecified 07/14/2013  . Pain in joint, pelvic region and thigh 07/14/2013  . Elevated blood pressure 05/11/2013  . Insomnia 05/11/2013  . BPH (benign prostatic hypertrophy) 05/11/2013  . Helicobacter pylori (H. pylori) 02/14/2013  . Irritable bowel syndrome 02/14/2013  . Encounter for therapeutic drug monitoring 02/09/2013  . GERD (gastroesophageal reflux disease) 02/09/2013    Kermit Balo, PTA 09/05/16 6:11 PM  [facility] [address]  [address] [place], [place], [address] Phone: [phone]   Fax:  [phone]  Name: Kalum Minner MRN: [id number] Date of Birth: Aug 22, 1946

## 2016-09-08 ENCOUNTER — Ambulatory Visit: Payer: Medicare Other | Admitting: Physical Therapy

## 2016-09-12 ENCOUNTER — Ambulatory Visit: Payer: Medicare Other

## 2016-09-12 DIAGNOSIS — M25551 Pain in right hip: Secondary | ICD-10-CM | POA: Diagnosis not present

## 2016-09-12 DIAGNOSIS — M6281 Muscle weakness (generalized): Secondary | ICD-10-CM

## 2016-09-12 NOTE — Therapy (Signed)
[facility] [address]  [address] [place], [place], [address] Phone: [phone]   Fax:  [phone]  Physical Therapy Treatment  Patient Details  Name: Jon Bush MRN: [id number] Date of Birth: 09/04/1945 Referring Provider: Jaquelyn Bitter. [place], [place]  Encounter Date: 09/12/2016      PT End of Session - 09/12/16 1405    Visit Number 4   Number of Visits 12   Date for PT Re-Evaluation 10/12/16   PT Start Time 1401   PT Stop Time 1443   PT Time Calculation (min) 42 min   Activity Tolerance Patient tolerated treatment well   Behavior During Therapy [facility] for tasks assessed/performed      Past Medical History:  Diagnosis Date  . Amaurosis fugax of left eye   . Anemia   . BPH (benign prostatic hyperplasia)   . Central retinal vein occlusion of left eye    (Left) Dr. Guy Begin [facility] Dr. Memory Argue [facility])   . Enlarged prostate   . H. pylori infection 02/14/2013  . High cholesterol   . Hypertension   . IBS (irritable bowel syndrome)   . Kidney stone   . Kidney stones   . Macular hole of left eye 2005  . Migraine headache   . Retinal detachment 2005  . Sleep apnea    cpap    Past Surgical History:  Procedure Laterality Date  . HERNIA REPAIR    . KIDNEY STONE SURGERY    . LITHOTRIPSY    . RETINAL DETACHMENT SURGERY    . REVERSE SHOULDER ARTHROPLASTY Right 02/18/2016  . REVERSE SHOULDER ARTHROPLASTY Right 02/18/2016   Procedure: RIGHT REVERSE SHOULDER ARTHROPLASTY;  Surgeon: Francena Hanly, MD;  [facility]: [facility] OR;  Service: Orthopedics;  Laterality: Right;    There were no vitals filed for this visit.      Subjective Assessment - 09/12/16 1446    Subjective Pt. reporting he still attends gym and rides stationary bike and has been performing HEP without issue daily.     Currently in Pain? No/denies   Pain Score 0-No pain   Multiple Pain Sites No             [facility] Adult PT  Treatment/Exercise - 09/12/16 1421      Knee/Hip Exercises: Stretches   Passive Hamstring Stretch 2 reps;Right;30 seconds   Passive Hamstring Stretch Limitations with therapist    ITB Stretch 2 reps;30 seconds;Right  with therapist    Piriformis Stretch 2 reps;30 seconds;Right   Piriformis Stretch Limitations with therapist      Knee/Hip Exercises: Standing   Hip Flexion AROM;Both;10 reps;Knee straight;2 sets   Hip Flexion Limitations holding onto treadmill    Hip Abduction AROM;Both;10 reps;Knee straight;2 sets   Abduction Limitations holding onto treadmill   Hip Extension AROM;Both;10 reps;Stengthening;Knee straight;2 sets   Extension Limitations holding onto treadmill     Knee/Hip Exercises: Supine   Bridges Limitations Hooklying bridge 5" x 15 reps   Bridges with Clamshell AROM;Both;3 sets;5 reps  Sustained bridge with unilateral hip abd/ER with blue TB      Shoulder Exercises: ROM/Strengthening   Cybex Row 15 reps  2 sets    Cybex Row Limitations 25#                 PT Education - 09/12/16 1440    Education provided Yes   Education Details 3-way SLR (no resistance)    Person(s) Educated Patient   Methods Explanation;Demonstration;Verbal cues;Handout  Comprehension Verbalized understanding;Returned demonstration;Verbal cues required;Need further instruction             PT Long Term Goals - 09/02/16 0932      PT LONG TERM GOAL #1   Title Patient to be independent with advanced HEP (10/12/16)   Status On-going     PT LONG TERM GOAL #2   Title Patient to improve R LE flexibility of HS, IT band and piriformis with good compliance with stretching independently (10/12/16)   Status On-going     PT LONG TERM GOAL #3   Title Patient to report ability to return to household work with no increase in pain (10/12/16)   Status On-going     PT LONG TERM GOAL #4   Title Patient to improve B LE strength to >/= 4+/5 with no increase in pain (10/12/16)   Status  On-going     PT LONG TERM GOAL #5   Title patient to demonstrate supine to sit with pain no greater than 1/10 for greater than 2 weeks (10/12/16)   Status On-going               Plan - 09/12/16 1445    Clinical Impression Statement Pt. able to progress to 3-way SLR today without pain thus this added to HEP.  Pt. still reporting excellent adherence to HEP and still attends local gym to ride stationary bike ~3x/wk.  Today's treatment incorporating low row due to pt. wanting to perform this at gym.  Pt. seems to be progressing well at this point however notes pain has not improved since going off Prednisone.  Pt. only complaint of hip pain is still with transition from supine<>sit in therapy.     PT Treatment/Interventions ADLs/Self Care Home Management;Cryotherapy;Electrical Stimulation;Moist Heat;Ultrasound;Neuromuscular re-education;Balance training;Therapeutic exercise;Therapeutic activities;Functional mobility training;Stair training;Gait training;Patient/family education;Manual techniques;Passive range of motion;Vasopneumatic Device;Taping;Dry needling   PT Next Visit Plan Gentle stretching and strengthening (hip)      Patient will benefit from skilled therapeutic intervention in order to improve the following deficits and impairments:  Decreased activity tolerance, Decreased range of motion, Decreased strength, Increased muscle spasms, Pain  Visit Diagnosis: Pain in right hip  Muscle weakness (generalized)     Problem List Patient Active Problem List   Diagnosis Date Noted  . 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 Impingement syndrome of right shoulder 08/07/2014  . Pain in the chest 02/09/2014  . Shortness of breath 02/09/2014  . Gastroesophageal reflux disease without esophagitis 02/09/2014  . Other and unspecified hyperlipidemia 09/08/2013  . Hereditary and idiopathic peripheral neuropathy 07/22/2013  . Allergic rhinitis, cause unspecified 07/14/2013  .  Pain in joint, pelvic region and thigh 07/14/2013  . Elevated blood pressure 05/11/2013  . Insomnia 05/11/2013  . BPH (benign prostatic hypertrophy) 05/11/2013  . Helicobacter pylori (H. pylori) 02/14/2013  . Irritable bowel syndrome 02/14/2013  . Encounter for therapeutic drug monitoring 02/09/2013  . GERD (gastroesophageal reflux disease) 02/09/2013    Kermit BaloMicah Anaka Beazer, PTA 09/12/16 4:20 PM  [facility] [address]  [address] [place], [place], [address] Phone: [phone]   Fax:  [phone]  Name: Rometta EmeryRichard Haller MRN: [id number] Date of Birth: Nov 08, 1945

## 2016-09-15 ENCOUNTER — Ambulatory Visit: Payer: Medicare Other

## 2016-09-15 DIAGNOSIS — M6281 Muscle weakness (generalized): Secondary | ICD-10-CM

## 2016-09-15 DIAGNOSIS — M25551 Pain in right hip: Secondary | ICD-10-CM | POA: Diagnosis not present

## 2016-09-15 NOTE — Therapy (Signed)
[facility] [address]  [address] [place], [place], [address] Phone: [phone]   Fax:  [phone]  Physical Therapy Treatment  Patient Details  Name: Jon EmeryRichard Jost MRN: [id number] Date of Birth: March 21, 1946 Referring Provider: Jaquelyn BitterStephen J. [place], [place]  Encounter Date: 09/15/2016      PT End of Session - 09/15/16 0934    Visit Number 5   Number of Visits 12   Date for PT Re-Evaluation 10/12/16   PT Start Time 0922  pt. taken back early    PT Stop Time 1020  moist heat to end treatment    PT Time Calculation (min) 58 min   Activity Tolerance Patient tolerated treatment well   Behavior During Therapy [facility] for tasks assessed/performed      Past Medical History:  Diagnosis Date  . Amaurosis fugax of left eye   . Anemia   . BPH (benign prostatic hyperplasia)   . Central retinal vein occlusion of left eye    (Left) Dr. Guy Beginobert DeVanzo [facility] Dr. Memory Argueraig [facility]   . Enlarged prostate   . H. pylori infection 02/14/2013  . High cholesterol   . Hypertension   . IBS (irritable bowel syndrome)   . Kidney stone   . Kidney stones   . Macular hole of left eye 2005  . Migraine headache   . Retinal detachment 2005  . Sleep apnea    cpap    Past Surgical History:  Procedure Laterality Date  . HERNIA REPAIR    . KIDNEY STONE SURGERY    . LITHOTRIPSY    . RETINAL DETACHMENT SURGERY    . REVERSE SHOULDER ARTHROPLASTY Right 02/18/2016  . REVERSE SHOULDER ARTHROPLASTY Right 02/18/2016   Procedure: RIGHT REVERSE SHOULDER ARTHROPLASTY;  Surgeon: Francena HanlyKevin Supple, MD;  Location: [facility] OR;  Service: Orthopedics;  Laterality: Right;    There were no vitals filed for this visit.      Subjective Assessment - 09/15/16 0924    Subjective Pt. reporting he still has R sided hip pain when going to sitting position from laying down.    Patient Stated Goals Reduce pain   Currently in Pain? No/denies   Pain Score 0-No pain             [facility] Adult PT Treatment/Exercise - 09/15/16 0948      Knee/Hip Exercises: Stretches   Passive Hamstring Stretch 2 reps;Right;30 seconds   Passive Hamstring Stretch Limitations with therapist    ITB Stretch 2 reps;30 seconds;Right   Piriformis Stretch 2 reps;30 seconds;Right   Piriformis Stretch Limitations with therapist; both Figure-4 and KTOS     Knee/Hip Exercises: Aerobic   Stationary Bike Recumbent bike: level 2, 8 min      Knee/Hip Exercises: Standing   Hip Flexion AROM;Both;10 reps;Knee straight;1 set   Hip Flexion Limitations Yellow TB; holding onto treadmill    Hip Abduction AROM;Both;10 reps;Knee straight;1 set   Abduction Limitations Yellow TB; holding onto treadmill   Hip Extension AROM;Both;10 reps;Stengthening;Knee straight;1 set   Extension Limitations yellow TB; holding onto treadmill   Functional Squat 1 set;15 reps;3 seconds   Functional Squat Limitations counter squat      Shoulder Exercises: ROM/Strengthening   Cybex Row 15 reps  5" hold    Cybex Row Limitations 25#      Modalities   Modalities Moist Heat     Moist Heat Therapy   Number Minutes Moist Heat 10 Minutes   Moist Heat Location Hip  R hip in R sidelying with bolster      Manual Therapy   Manual Therapy Soft tissue mobilization   Manual therapy comments STM/TPR with red med ball (1000Gr) to R piriformis area x 5 min; good tolerance with pt. confirming appropriateness of pressure    Soft tissue mobilization R piriformis            PT Education - 09/15/16 0933    Education provided Yes   Education Details Posture education handout    Person(s) Educated Patient   Methods Explanation;Demonstration;Verbal cues;Handout   Comprehension Verbalized understanding;Verbal cues required;Need further instruction             PT Long Term Goals - 09/02/16 0932      PT LONG TERM GOAL #1   Title Patient to be independent with advanced HEP (10/12/16)   Status On-going     PT  LONG TERM GOAL #2   Title Patient to improve R LE flexibility of HS, IT band and piriformis with good compliance with stretching independently (10/12/16)   Status On-going     PT LONG TERM GOAL #3   Title Patient to report ability to return to household work with no increase in pain (10/12/16)   Status On-going     PT LONG TERM GOAL #4   Title Patient to improve B LE strength to >/= 4+/5 with no increase in pain (10/12/16)   Status On-going     PT LONG TERM GOAL #5   Title patient to demonstrate supine to sit with pain no greater than 1/10 for greater than 2 weeks (10/12/16)   Status On-going               Plan - 09/15/16 1011    Clinical Impression Statement Pt. still reporting R hip pain with supine<>sit transition, however when process reviewed with pt., it was revealed pt. with poor technique with this.  Proper supine<>sit technique reviewed and demo'd with pt. today with posture/body mechanics handout issued.  Pt. reporting SLR HEP activities going well at home thus yellow looped TB added today with good tolerance.  Technique instruction required for counter squat and low machine row today due to poor demo.  Pt. continues to require frequent review with HEP activities to prevent poor technique.  STM/TPR in L sidelying to R piriformis area performed today with med ball.  Pt. confirming appropriateness of pressure with this and reported good benefit.  Treatment concluding with moist heat to R hip to promote relaxation.  Pt. would continue to benefit from further skilled therapy and instruction to improve LE flexibility, technique with daily activities, and maximize function.     PT Treatment/Interventions ADLs/Self Care Home Management;Cryotherapy;Electrical Stimulation;Moist Heat;Ultrasound;Neuromuscular re-education;Balance training;Therapeutic exercise;Therapeutic activities;Functional mobility training;Stair training;Gait training;Patient/family education;Manual techniques;Passive range  of motion;Vasopneumatic Device;Taping;Dry needling   PT Next Visit Plan Continue R piriformis STM with roller/med ball if benefit noted; Gentle stretching and strengthening (hip)      Patient will benefit from skilled therapeutic intervention in order to improve the following deficits and impairments:  Decreased activity tolerance, Decreased range of motion, Decreased strength, Increased muscle spasms, Pain  Visit Diagnosis: Pain in right hip  Muscle weakness (generalized)     Problem List Patient Active Problem List   Diagnosis Date Noted  . 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 Impingement syndrome of right shoulder 08/07/2014  . Pain in the chest 02/09/2014  . Shortness of breath 02/09/2014  . Gastroesophageal reflux disease without esophagitis 02/09/2014  .  Other and unspecified hyperlipidemia 09/08/2013  . Hereditary and idiopathic peripheral neuropathy 07/22/2013  . Allergic rhinitis, cause unspecified 07/14/2013  . Pain in joint, pelvic region and thigh 07/14/2013  . Elevated blood pressure 05/11/2013  . Insomnia 05/11/2013  . BPH (benign prostatic hypertrophy) 05/11/2013  . Helicobacter pylori (H. pylori) 02/14/2013  . Irritable bowel syndrome 02/14/2013  . Encounter for therapeutic drug monitoring 02/09/2013  . GERD (gastroesophageal reflux disease) 02/09/2013    Kermit Balo, PTA 09/15/16 10:35 AM  [facility] [address]  [address] [place], [place], [address] Phone: [phone]   Fax:  [phone]  Name: Numan Zylstra MRN: [id number] Date of Birth: 04/01/1946

## 2016-09-15 NOTE — Patient Instructions (Signed)

## 2016-09-19 ENCOUNTER — Ambulatory Visit: Payer: Medicare Other

## 2016-09-19 DIAGNOSIS — M6281 Muscle weakness (generalized): Secondary | ICD-10-CM

## 2016-09-19 DIAGNOSIS — M25551 Pain in right hip: Secondary | ICD-10-CM | POA: Diagnosis not present

## 2016-09-19 NOTE — Therapy (Signed)
[facility] [address]  [address] [place], [place], [address] Phone: [phone]   Fax:  [phone]  Physical Therapy Treatment  Patient Details  Name: Jon EmeryRichard Bush MRN: [id number] Date of Birth: 06/23/1946 Referring Provider: Jaquelyn BitterStephen J. [place], [place]  Encounter Date: 09/19/2016      PT End of Session - 09/19/16 1415    Visit Number 6   Number of Visits 12   Date for PT Re-Evaluation 10/12/16   PT Start Time 1358   PT Stop Time 1443   PT Time Calculation (min) 45 min   Activity Tolerance Patient tolerated treatment well   Behavior During Therapy [facility] for tasks assessed/performed      Past Medical History:  Diagnosis Date  . Amaurosis fugax of left eye   . Anemia   . BPH (benign prostatic hyperplasia)   . Central retinal vein occlusion of left eye    (Left) Dr. Guy Beginobert DeVanzo [facility] Dr. Memory Argueraig Grevin [facility])   . Enlarged prostate   . H. pylori infection 02/14/2013  . High cholesterol   . Hypertension   . IBS (irritable bowel syndrome)   . Kidney stone   . Kidney stones   . Macular hole of left eye 2005  . Migraine headache   . Retinal detachment 2005  . Sleep apnea    cpap    Past Surgical History:  Procedure Laterality Date  . HERNIA REPAIR    . KIDNEY STONE SURGERY    . LITHOTRIPSY    . RETINAL DETACHMENT SURGERY    . REVERSE SHOULDER ARTHROPLASTY Right 02/18/2016  . REVERSE SHOULDER ARTHROPLASTY Right 02/18/2016   Procedure: RIGHT REVERSE SHOULDER ARTHROPLASTY;  Surgeon: Francena HanlyKevin Supple, MD;  [facility];  Service: Orthopedics;  Laterality: Right;    There were no vitals filed for this visit.      Subjective Assessment - 09/19/16 1401    Subjective Pt. reporting he has had less pain with going from laying down > sitting since reviewing new technique in therapy.  Pt. reporting he still wakes 1x/night with pain requiring Tylenol.   Patient Stated Goals Reduce pain   Currently in Pain? Yes    Pain Score 1    Pain Location Buttocks   Pain Orientation Right   Pain Descriptors / Indicators Aching   Pain Type Acute pain   Multiple Pain Sites No            [facility] Adult PT Treatment/Exercise - 09/19/16 1418      Knee/Hip Exercises: Stretches   Passive Hamstring Stretch 2 reps;Right;30 seconds   Passive Hamstring Stretch Limitations with therapist    ITB Stretch 2 reps;30 seconds;Right   Piriformis Stretch 2 reps;30 seconds;Right   Piriformis Stretch Limitations with therapist; both Figure-4 and KTOS     Knee/Hip Exercises: Aerobic   Nustep NuStep: level 7, 6 min      Knee/Hip Exercises: Standing   Hip Flexion AROM;Both;Knee straight;1 set;15 reps   Hip Flexion Limitations Yellow TB; holding onto treadmill    Hip Abduction AROM;Both;Knee straight;1 set;15 reps   Abduction Limitations Yellow TB; holding onto treadmill   Hip Extension AROM;Both;Stengthening;Knee straight;1 set;15 reps   Extension Limitations yellow TB; holding onto treadmill     Knee/Hip Exercises: Seated   Other Seated Knee/Hip Exercises Seated lumbar stretch x 3 directions 5" x 5 reps      Knee/Hip Exercises: Supine   Other Supine Knee/Hip Exercises review of log-rolling technique for supine to sit -  improved carryover today   Other Supine Knee/Hip Exercises SL bridge with heels on peanut p-ball 2 x 10 reps     Shoulder Exercises: Standing   Extension AROM;15 reps;Theraband;Both   Theraband Level (Shoulder Extension) Level 3 (Green)   Extension Limitations Tandem stance with 3" hold      Shoulder Exercises: ROM/Strengthening   Cybex Row 15 reps   Cybex Row Limitations 35#           PT Education - 09/19/16 1850    Education provided Yes   Education Details yellow looped TB issued to pt. today for SLR    Person(s) Educated Patient   Methods Explanation;Demonstration;Verbal cues;Handout   Comprehension Verbalized understanding;Returned demonstration;Verbal cues required              PT Long Term Goals - 09/02/16 0932      PT LONG TERM GOAL #1   Title Patient to be independent with advanced HEP (10/12/16)   Status On-going     PT LONG TERM GOAL #2   Title Patient to improve R LE flexibility of HS, IT band and piriformis with good compliance with stretching independently (10/12/16)   Status On-going     PT LONG TERM GOAL #3   Title Patient to report ability to return to household work with no increase in pain (10/12/16)   Status On-going     PT LONG TERM GOAL #4   Title Patient to improve B LE strength to >/= 4+/5 with no increase in pain (10/12/16)   Status On-going     PT LONG TERM GOAL #5   Title patient to demonstrate supine to sit with pain no greater than 1/10 for greater than 2 weeks (10/12/16)   Status On-going               Plan - 09/19/16 1848    Clinical Impression Statement Pt. reporting he has been using log rolling technique since reviewing this in therapy and this has decreased pain with supine<>sit transition. Pt. with improved carry-over with this today in therapy.  Pt. still noting pain with putting on socks/shoes however proper technique with this reviewed with pt. today with pt. verbalizing understanding.  Standing hip and mid back strengthening activities advanced today with pt. tolerating progression well.  Pt. pain free with all therex thus yellow looped TB issued to pt. for SLR HEP activities.  Will plan to continue review of proper body mechanics with ADL with pt. prn and progress back, core, hip strengthening as tolerated.     PT Treatment/Interventions ADLs/Self Care Home Management;Cryotherapy;Electrical Stimulation;Moist Heat;Ultrasound;Neuromuscular re-education;Balance training;Therapeutic exercise;Therapeutic activities;Functional mobility training;Stair training;Gait training;Patient/family education;Manual techniques;Passive range of motion;Vasopneumatic Device;Taping;Dry needling   PT Next Visit Plan Gentle stretching and  strengthening (hip)      Patient will benefit from skilled therapeutic intervention in order to improve the following deficits and impairments:  Decreased activity tolerance, Decreased range of motion, Decreased strength, Increased muscle spasms, Pain  Visit Diagnosis: Pain in right hip  Muscle weakness (generalized)     Problem List Patient Active Problem List   Diagnosis Date Noted  . 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 Impingement syndrome of right shoulder 08/07/2014  . Pain in the chest 02/09/2014  . Shortness of breath 02/09/2014  . Gastroesophageal reflux disease without esophagitis 02/09/2014  . Other and unspecified hyperlipidemia 09/08/2013  . Hereditary and idiopathic peripheral neuropathy 07/22/2013  . Allergic rhinitis, cause unspecified 07/14/2013  . Pain in joint, pelvic region and thigh  07/14/2013  . Elevated blood pressure 05/11/2013  . Insomnia 05/11/2013  . BPH (benign prostatic hypertrophy) 05/11/2013  . Helicobacter pylori (H. pylori) 02/14/2013  . Irritable bowel syndrome 02/14/2013  . Encounter for therapeutic drug monitoring 02/09/2013  . GERD (gastroesophageal reflux disease) 02/09/2013    Kermit Balo, PTA 09/19/16 6:57 PM  [facility] [address]  [address] [place], [place], [address] Phone: [phone]   Fax:  [phone]  Name: Jon Bush MRN: [id number] Date of Birth: 05-05-1946

## 2016-09-22 ENCOUNTER — Ambulatory Visit: Payer: Medicare Other | Attending: Orthopedic Surgery | Admitting: Physical Therapy

## 2016-09-22 DIAGNOSIS — M25551 Pain in right hip: Secondary | ICD-10-CM | POA: Insufficient documentation

## 2016-09-22 DIAGNOSIS — M6281 Muscle weakness (generalized): Secondary | ICD-10-CM | POA: Insufficient documentation

## 2016-09-22 NOTE — Patient Instructions (Signed)
Seated hamstring stretch   Right leg extended out - knee straight, bend forward at hip for 30 seconds x 3 reps   Seated piriformis stretch   Seated with R leg over L; hand on R knee, bend forward at the hips for 30 seconds x 3 reps

## 2016-09-22 NOTE — Therapy (Signed)
[facility] [address]  [address] [place], [place], [address] Phone: [phone]   Fax:  [phone]  Physical Therapy Treatment  Patient Details  Name: Jon Bush MRN: [id number] Date of Birth: 12/29/1945 Referring Provider: Jaquelyn Bitter. [place], [place]  Encounter Date: 09/22/2016      PT End of Session - 09/22/16 6962    Visit Number 7   Number of Visits 12   Date for PT Re-Evaluation 10/12/16   PT Start Time 0923   PT Stop Time 1016  time for moist heat at end of session   PT Time Calculation (min) 53 min   Activity Tolerance Patient tolerated treatment well   Behavior During Therapy [facility] for tasks assessed/performed      Past Medical History:  Diagnosis Date  . Amaurosis fugax of left eye   . Anemia   . BPH (benign prostatic hyperplasia)   . Central retinal vein occlusion of left eye    (Left) Dr. Guy Begin [facility]); Dr. Memory Argue [facility])   . Enlarged prostate   . H. pylori infection 02/14/2013  . High cholesterol   . Hypertension   . IBS (irritable bowel syndrome)   . Kidney stone   . Kidney stones   . Macular hole of left eye 2005  . Migraine headache   . Retinal detachment 2005  . Sleep apnea    cpap    Past Surgical History:  Procedure Laterality Date  . HERNIA REPAIR    . KIDNEY STONE SURGERY    . LITHOTRIPSY    . RETINAL DETACHMENT SURGERY    . REVERSE SHOULDER ARTHROPLASTY Right 02/18/2016  . REVERSE SHOULDER ARTHROPLASTY Right 02/18/2016   Procedure: RIGHT REVERSE SHOULDER ARTHROPLASTY;  Surgeon: Francena Hanly, MD;  [facility];  Service: Orthopedics;  Laterality: Right;    There were no vitals filed for this visit.      Subjective Assessment - 09/22/16 0925    Subjective patient feeling well - continues to have difficulty with putting on shoes - must take 2 tylenol to "get through night"   Patient Stated Goals Reduce pain   Currently in Pain? No/denies   Pain Score 0-No  pain                         [facility] Adult PT Treatment/Exercise - 09/22/16 0001      Knee/Hip Exercises: Stretches   Passive Hamstring Stretch Right;3 reps;30 seconds   Passive Hamstring Stretch Limitations seated with R LE extended   ITB Stretch Right;3 reps;30 seconds   Piriformis Stretch Right;3 reps;30 seconds   Piriformis Stretch Limitations seated figure 4 with trunk flexion     Knee/Hip Exercises: Aerobic   Nustep level 6 x 7 minutes     Knee/Hip Exercises: Standing   Side Lunges Limitations side stepping 20 feet each way with red tband at ankles   Hip Abduction Stengthening;Right;15 reps;Knee straight   Abduction Limitations 2#   Hip Extension Stengthening;Right;15 reps;Knee straight   Extension Limitations 2#   Functional Squat 15 reps   Functional Squat Limitations at counter     Knee/Hip Exercises: Supine   Bridges Limitations 15 reps with green tband at [employer] with Clamshell Strengthening;Both;15 reps  green tband at knees     Knee/Hip Exercises: Sidelying   Hip ABduction Strengthening;Right;15 reps   Hip ABduction Limitations VC for form and muscle control   Clams R sided - green  tband at knees x 15 reps     Knee/Hip Exercises: Prone   Hamstring Curl 15 reps   Hamstring Curl Limitations R LE - 2# - "pulling"   Hip Extension Strengthening;Right;15 reps   Hip Extension Limitations 3 second hold     Modalities   Modalities Moist Heat     Moist Heat Therapy   Number Minutes Moist Heat 10 Minutes   Moist Heat Location Hip                  PT Short Term Goals - 04/07/16 1247      PT SHORT TERM GOAL #1   Title improve PROM to WNL for improved motion (04/11/16)   Baseline achieved within protocol limitations   Status Achieved     PT SHORT TERM GOAL #2   Title independent with initial HEP (04/11/16)   Status Achieved           PT Long Term Goals - 09/02/16 0932      PT LONG TERM GOAL #1   Title Patient to be  independent with advanced HEP (10/12/16)   Status On-going     PT LONG TERM GOAL #2   Title Patient to improve R LE flexibility of HS, IT band and piriformis with good compliance with stretching independently (10/12/16)   Status On-going     PT LONG TERM GOAL #3   Title Patient to report ability to return to household work with no increase in pain (10/12/16)   Status On-going     PT LONG TERM GOAL #4   Title Patient to improve B LE strength to >/= 4+/5 with no increase in pain (10/12/16)   Status On-going     PT LONG TERM GOAL #5   Title patient to demonstrate supine to sit with pain no greater than 1/10 for greater than 2 weeks (10/12/16)   Status On-going               Plan - 09/22/16 1008    Clinical Impression Statement [name] doing well today. Patient with subjective reports of pain in localized area - able to "get to pain" with stretching and certain positioning. Patient demonstrating good log roll and transfer techniques to reduced strain on low back. PT session today focused on stretching positions that patient can do at home or work while sitting rather than supine, as well as some hip extension and abduction strengthing as patient is visibly weak with prone hip extension. Patient to continue to benefit from PT to maximize function.    PT Treatment/Interventions ADLs/Self Care Home Management;Cryotherapy;Electrical Stimulation;Moist Heat;Ultrasound;Neuromuscular re-education;Balance training;Therapeutic exercise;Therapeutic activities;Functional mobility training;Stair training;Gait training;Patient/family education;Manual techniques;Passive range of motion;Vasopneumatic Device;Taping;Dry needling   PT Next Visit Plan hip extension, abduction strengthening - progressing towards functional strengthening   Consulted and Agree with Plan of Care Patient      Patient will benefit from skilled therapeutic intervention in order to improve the following deficits and  impairments:  Decreased activity tolerance, Decreased range of motion, Decreased strength, Increased muscle spasms, Pain  Visit Diagnosis: Pain in right hip  Muscle weakness (generalized)     Problem List Patient Active Problem List   Diagnosis Date Noted  . 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 Impingement syndrome of right shoulder 08/07/2014  . Pain in the chest 02/09/2014  . Shortness of breath 02/09/2014  . Gastroesophageal reflux disease without esophagitis 02/09/2014  . Other and unspecified hyperlipidemia 09/08/2013  . Hereditary and idiopathic peripheral  neuropathy 07/22/2013  . Allergic rhinitis, cause unspecified 07/14/2013  . Pain in joint, pelvic region and thigh 07/14/2013  . Elevated blood pressure 05/11/2013  . Insomnia 05/11/2013  . BPH (benign prostatic hypertrophy) 05/11/2013  . Helicobacter pylori (H. pylori) 02/14/2013  . Irritable bowel syndrome 02/14/2013  . Encounter for therapeutic drug monitoring 02/09/2013  . GERD (gastroesophageal reflux disease) 02/09/2013    Kipp Laurence, PT, DPT 09/22/16 10:49 AM   [facility] [address]  [address] [place], [place], [address] Phone: [phone]   Fax:  [phone]  Name: Sonnie Pawloski MRN: [id number] Date of Birth: 12/08/1945

## 2016-09-26 ENCOUNTER — Ambulatory Visit: Payer: Medicare Other

## 2016-09-26 DIAGNOSIS — M25551 Pain in right hip: Secondary | ICD-10-CM | POA: Diagnosis not present

## 2016-09-26 DIAGNOSIS — M6281 Muscle weakness (generalized): Secondary | ICD-10-CM

## 2016-09-26 NOTE — Therapy (Signed)
Dictation #1 [id number]  [facility] [place], [place], [address] Phone: [phone]   Fax:  [phone]  Physical Therapy Treatment  Patient Details  Name: Jon Bush MRN: [id number] Date of Birth: 1946-05-11 Referring Provider: Judith Part. [place], [place]  Encounter Date: 09/26/2016      PT End of Session - 09/26/16 0160    Visit Number 8   Number of Visits 12   Date for PT Re-Evaluation 10/12/16   PT Start Time 0932   PT Stop Time 1015   PT Time Calculation (min) 43 min   Activity Tolerance Patient tolerated treatment well   Behavior During Therapy [facility] for tasks assessed/performed      Past Medical History:  Diagnosis Date  . Amaurosis fugax of left eye   . Anemia   . BPH (benign prostatic hyperplasia)   . Central retinal vein occlusion of left eye    (Left) Dr. Fenton Foy [facility]); Dr. Newell Coral [facility])   . Enlarged prostate   . H. pylori infection 02/14/2013  . High cholesterol   . Hypertension   . IBS (irritable bowel syndrome)   . Kidney stone   . Kidney stones   . Macular hole of left eye 2005  . Migraine headache   . Retinal detachment 2005  . Sleep apnea    cpap    Past Surgical History:  Procedure Laterality Date  . HERNIA REPAIR    . KIDNEY STONE SURGERY    . LITHOTRIPSY    . RETINAL DETACHMENT SURGERY    . REVERSE SHOULDER ARTHROPLASTY Right 02/18/2016  . REVERSE SHOULDER ARTHROPLASTY Right 02/18/2016   Procedure: RIGHT REVERSE SHOULDER ARTHROPLASTY;  Surgeon: Justice Britain, MD;  Location: [place];  Service: Orthopedics;  Laterality: Right;    There were no vitals filed for this visit.      Subjective Assessment - 09/26/16 0936    Subjective Pt. reporting he still has occasional LBP while going from supine > sitting position.     Patient Stated Goals Reduce pain   Currently in Pain? No/denies   Pain Score 0-No pain   Multiple Pain Sites  No            [facility] PT Assessment - 09/26/16 1001      Strength   Right/Left Hip Right;Left   Right Hip Flexion 4/5   Right Hip Extension 4-/5   Right Hip ABduction 4/5   Left Hip Flexion 4+/5   Left Hip Extension 4/5   Left Hip ABduction 4+/5   Right/Left Knee Right;Left   Right Knee Flexion 4+/5   Right Knee Extension 4+/5   Left Knee Flexion 4+/5   Left Knee Extension 4+/5            [facility] Adult PT Treatment/Exercise - 09/26/16 0945      Knee/Hip Exercises: Aerobic   Recumbent Bike Level 2, 6 min      Knee/Hip Exercises: Supine   Bridges Limitations 15 reps with blue TB at knees   Single Leg Bridge AROM;Right;Left;10 reps;2 sets  3" hold    Other Supine Knee/Hip Exercises Hooklying bridge 5" x 15 reps      Knee/Hip Exercises: Sidelying   Hip ABduction Strengthening;Right;15 reps   Hip ABduction Limitations Bilaterally     Knee/Hip Exercises: Prone   Hip Extension Strengthening;Right;15 reps;2 sets   Hip Extension Limitations 3 second hold     Manual Therapy   Manual Therapy  Soft tissue mobilization   Manual therapy comments STM/TPR with red med ball (1000Gr) to R piriformis area x 5 min; good tolerance with pt. confirming appropriateness of pressure    Soft tissue mobilization R piriformis            PT Short Term Goals - 04/07/16 1247      PT SHORT TERM GOAL #1   Title improve PROM to WNL for improved motion (04/11/16)   Baseline achieved within protocol limitations   Status Achieved     PT SHORT TERM GOAL #2   Title independent with initial HEP (04/11/16)   Status Achieved           PT Long Term Goals - 09/26/16 0950      PT LONG TERM GOAL #1   Title Patient to be independent with advanced HEP (10/12/16)   Status Partially Met  2.5.18: met for current     PT LONG TERM GOAL #2   Title Patient to improve R LE flexibility of HS, IT band and piriformis with good compliance with stretching independently (10/12/16)   Status On-going      PT LONG TERM GOAL #3   Title Patient to report ability to return to household work with no increase in pain (10/12/16)   Status On-going  2.5.18: Pt. reporting he still feels, "pulling sensation" with housework with 1/10 pain.       PT LONG TERM GOAL #4   Title Patient to improve B LE strength to >/= 4+/5 with no increase in pain (10/12/16)   Status On-going     PT LONG TERM GOAL #5   Title patient to demonstrate supine to sit with pain no greater than 1/10 for greater than 2 weeks (10/12/16)   Status On-going  2.5.18: Pt. able to demonstrate this pain free in treatment however reports he still has pain going from supine<>sit at home               Plan - 09/26/16 1007    Clinical Impression Statement Pt. reporting he still has occasional R hip pain with supine<>sit movements at home and still wakes up occasionally at night with R hip pain.  Pt. still reporting mild R hip pain and, "pulling sensation" with household chores and ADL's at this point.  Pt. still demonstrating significant weakness with hip extension and abduction motions with testing today.  Today's treatment focusing on hip extension and abduction strengthening activity progression with pt. tolerating well.  Pt. still reporting consistent HEP adherence and was instructed to focus on bridging activities with HEP.  Pt. seems to be progressing well and will continue to benefit from further skilled therapy to improve LE strength, flexibility, and activity tolerance.      PT Treatment/Interventions ADLs/Self Care Home Management;Cryotherapy;Electrical Stimulation;Moist Heat;Ultrasound;Neuromuscular re-education;Balance training;Therapeutic exercise;Therapeutic activities;Functional mobility training;Stair training;Gait training;Patient/family education;Manual techniques;Passive range of motion;Vasopneumatic Device;Taping;Dry needling   PT Next Visit Plan hip extension, abduction strengthening - progressing towards functional  strengthening      Patient will benefit from skilled therapeutic intervention in order to improve the following deficits and impairments:  Decreased activity tolerance, Decreased range of motion, Decreased strength, Increased muscle spasms, Pain  Visit Diagnosis: Pain in right hip  Muscle weakness (generalized)     Problem List Patient Active Problem List   Diagnosis Date Noted  . 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 Impingement syndrome of right shoulder 08/07/2014  . Pain in the chest 02/09/2014  . Shortness of breath 02/09/2014  .  Gastroesophageal reflux disease without esophagitis 02/09/2014  . Other and unspecified hyperlipidemia 09/08/2013  . Hereditary and idiopathic peripheral neuropathy 07/22/2013  . Allergic rhinitis, cause unspecified 07/14/2013  . Pain in joint, pelvic region and thigh 07/14/2013  . Elevated blood pressure 05/11/2013  . Insomnia 05/11/2013  . BPH (benign prostatic hypertrophy) 05/11/2013  . Helicobacter pylori (H. pylori) 02/14/2013  . Irritable bowel syndrome 02/14/2013  . Encounter for therapeutic drug monitoring 02/09/2013  . GERD (gastroesophageal reflux disease) 02/09/2013    Bess Harvest, PTA 09/26/16 12:41 PM  [facility] [address]  [place], [place], [address] Phone: [phone]   Fax:  [phone]  Name: Jon Bush MRN: [id number] Date of Birth: [birth year]

## 2016-09-28 ENCOUNTER — Ambulatory Visit: Payer: Medicare Other | Admitting: Physical Therapy

## 2016-09-28 DIAGNOSIS — M6281 Muscle weakness (generalized): Secondary | ICD-10-CM

## 2016-09-28 DIAGNOSIS — M25551 Pain in right hip: Secondary | ICD-10-CM

## 2016-09-28 NOTE — Therapy (Signed)
[facility] [address]  [place], [place], [address] Phone: [phone]   Fax:  [phone]  Physical Therapy Treatment  Patient Details  Name: Jon Bush MRN: [id number] Date of Birth: 1946-06-29 Referring Provider: Judith Part. [place], [place]  Encounter Date: 09/28/2016      PT End of Session - 09/28/16 0859    Visit Number 9   Number of Visits 12   Date for PT Re-Evaluation 10/12/16   PT Start Time 0848   PT Stop Time 0941  moist heat at end of session   PT Time Calculation (min) 53 min   Activity Tolerance Patient tolerated treatment well   Behavior During Therapy [facility] for tasks assessed/performed      Past Medical History:  Diagnosis Date  . Amaurosis fugax of left eye   . Anemia   . BPH (benign prostatic hyperplasia)   . Central retinal vein occlusion of left eye    (Left) Dr. Fenton Foy [facility]); Dr. Newell Coral [facility])   . Enlarged prostate   . H. pylori infection 02/14/2013  . High cholesterol   . Hypertension   . IBS (irritable bowel syndrome)   . Kidney stone   . Kidney stones   . Macular hole of left eye 2005  . Migraine headache   . Retinal detachment 2005  . Sleep apnea    cpap    Past Surgical History:  Procedure Laterality Date  . HERNIA REPAIR    . KIDNEY STONE SURGERY    . LITHOTRIPSY    . RETINAL DETACHMENT SURGERY    . REVERSE SHOULDER ARTHROPLASTY Right 02/18/2016  . REVERSE SHOULDER ARTHROPLASTY Right 02/18/2016   Procedure: RIGHT REVERSE SHOULDER ARTHROPLASTY;  Surgeon: Justice Britain, MD;  Location: [place];  Service: Orthopedics;  Laterality: Right;    There were no vitals filed for this visit.      Subjective Assessment - 09/28/16 0850    Subjective still having some pain during the night - stretches helped last night   Patient Stated Goals Reduce pain   Currently in Pain? No/denies   Pain Score 0-No pain  6/10 last night/this morning                          [facility] Adult PT Treatment/Exercise - 09/28/16 0902      Knee/Hip Exercises: Stretches   Passive Hamstring Stretch Right;3 reps;30 seconds   Passive Hamstring Stretch Limitations seated with R LE extended   Piriformis Stretch Right;3 reps;30 seconds   Piriformis Stretch Limitations seated figure 4 with trunk flexion     Knee/Hip Exercises: Aerobic   Nustep level 6 x 7 minutes     Knee/Hip Exercises: Standing   Hip Flexion Stengthening;Both;10 reps;Knee straight   Hip Flexion Limitations red tband at ankles   Forward Lunges Both;10 reps   Forward Lunges Limitations single hand-hold at counter   Side Lunges Limitations side stepping 30 feet each way with red tband at ankles   Hip Abduction Stengthening;Both;10 reps;Knee straight   Abduction Limitations red tband at ankles   Hip Extension Stengthening;Both;10 reps;Knee straight   Extension Limitations red tband at ankles    Functional Squat 15 reps   Functional Squat Limitations at counter; VC for form     Knee/Hip Exercises: Seated   Other Seated Knee/Hip Exercises self- manual massage with ball - figure 4 position - R buttock   Sit to Sand 15 reps;without UE  support  5# in hand; low mat table     Knee/Hip Exercises: Supine   Bridges Limitations 15 reps with green tband at knees  some cramping -likely due to reduced glute set prior to lift     Knee/Hip Exercises: Sidelying   Clams B LE - green tband at knees x 15 reps - VC for form     Modalities   Modalities Moist Heat     Moist Heat Therapy   Number Minutes Moist Heat 10 Minutes   Moist Heat Location Hip                  PT Short Term Goals - 04/07/16 1247      PT SHORT TERM GOAL #1   Title improve PROM to WNL for improved motion (04/11/16)   Baseline achieved within protocol limitations   Status Achieved     PT SHORT TERM GOAL #2   Title independent with initial HEP (04/11/16)   Status Achieved           PT  Long Term Goals - 09/26/16 0950      PT LONG TERM GOAL #1   Title Patient to be independent with advanced HEP (10/12/16)   Status Partially Met  2.5.18: met for current     PT LONG TERM GOAL #2   Title Patient to improve R LE flexibility of HS, IT band and piriformis with good compliance with stretching independently (10/12/16)   Status On-going     PT LONG TERM GOAL #3   Title Patient to report ability to return to household work with no increase in pain (10/12/16)   Status On-going  2.5.18: Pt. reporting he still feels, "pulling sensation" with housework with 1/10 pain.       PT LONG TERM GOAL #4   Title Patient to improve B LE strength to >/= 4+/5 with no increase in pain (10/12/16)   Status On-going     PT LONG TERM GOAL #5   Title patient to demonstrate supine to sit with pain no greater than 1/10 for greater than 2 weeks (10/12/16)   Status On-going  2.5.18: Pt. able to demonstrate this pain free in treatment however reports he still has pain going from supine<>sit at home               Plan - 09/28/16 0900    Clinical Impression Statement Mr. Jon Bush doing well today. Continued subjective reports of R buttock pain at night and upon waking, which he generally takes tylenol for pain relief, however was able to do stretches last night to alleviate pain. Patient progressing hip strengthening well with ability to tolerate all static and dynamic standing strengthening with no increase in pain, only reported "pulling" sensation of R hip. Patient instructed today in self massage of buttock with bal while in seated figure 4 position with good carryover. Patient to continue to progress functional strength in standing positions to maximize function and mobility.    PT Treatment/Interventions ADLs/Self Care Home Management;Cryotherapy;Electrical Stimulation;Moist Heat;Ultrasound;Neuromuscular re-education;Balance training;Therapeutic exercise;Therapeutic activities;Functional mobility  training;Stair training;Gait training;Patient/family education;Manual techniques;Passive range of motion;Vasopneumatic Device;Taping;Dry needling   PT Next Visit Plan hip extension, abduction strengthening - progressing towards functional strengthening   Consulted and Agree with Plan of Care Patient      Patient will benefit from skilled therapeutic intervention in order to improve the following deficits and impairments:  Decreased activity tolerance, Decreased range of motion, Decreased strength, Increased muscle spasms, Pain  Visit Diagnosis: Pain  in right hip  Muscle weakness (generalized)     Problem List Patient Active Problem List   Diagnosis Date Noted  . 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 Impingement syndrome of right shoulder 08/07/2014  . Pain in the chest 02/09/2014  . Shortness of breath 02/09/2014  . Gastroesophageal reflux disease without esophagitis 02/09/2014  . Other and unspecified hyperlipidemia 09/08/2013  . Hereditary and idiopathic peripheral neuropathy 07/22/2013  . Allergic rhinitis, cause unspecified 07/14/2013  . Pain in joint, pelvic region and thigh 07/14/2013  . Elevated blood pressure 05/11/2013  . Insomnia 05/11/2013  . BPH (benign prostatic hypertrophy) 05/11/2013  . Helicobacter pylori (H. pylori) 02/14/2013  . Irritable bowel syndrome 02/14/2013  . Encounter for therapeutic drug monitoring 02/09/2013  . GERD (gastroesophageal reflux disease) 02/09/2013     Lanney Gins, PT, DPT 09/28/16 10:31 AM   [facility] [address]  [place], [place], [address] Phone: [phone]   Fax:  [phone]  Name: Jon Bush MRN: [id number] Date of Birth: 11-08-1945

## 2016-10-03 ENCOUNTER — Ambulatory Visit: Payer: Medicare Other

## 2016-10-03 DIAGNOSIS — M25551 Pain in right hip: Secondary | ICD-10-CM

## 2016-10-03 DIAGNOSIS — M6281 Muscle weakness (generalized): Secondary | ICD-10-CM

## 2016-10-03 NOTE — Therapy (Signed)
[facility] [address]  [place], [place], [address] Phone: [phone]   Fax:  [phone]  Physical Therapy Treatment  Patient Details  Name: Jon Bush MRN: [id number] Date of Birth: [birth year] Referring Provider: Judith Part. [place], [place]  Encounter Date: 10/03/2016      PT End of Session - 10/03/16 1315    Visit Number 10   Number of Visits 12   Date for PT Re-Evaluation 10/12/16   PT Start Time 1320  tx started late due to pt. requiring increased time for FOTO   PT Stop Time 1410   PT Time Calculation (min) 50 min   Activity Tolerance Patient tolerated treatment well   Behavior During Therapy [facility] for tasks assessed/performed      Past Medical History:  Diagnosis Date  . Amaurosis fugax of left eye   . Anemia   . BPH (benign prostatic hyperplasia)   . Central retinal vein occlusion of left eye    (Left) Dr. Fenton Foy [facility]); Dr. Newell Coral [facility])   . Enlarged prostate   . H. pylori infection 02/14/2013  . High cholesterol   . Hypertension   . IBS (irritable bowel syndrome)   . Kidney stone   . Kidney stones   . Macular hole of left eye 2005  . Migraine headache   . Retinal detachment 2005  . Sleep apnea    cpap    Past Surgical History:  Procedure Laterality Date  . HERNIA REPAIR    . KIDNEY STONE SURGERY    . LITHOTRIPSY    . RETINAL DETACHMENT SURGERY    . REVERSE SHOULDER ARTHROPLASTY Right 02/18/2016  . REVERSE SHOULDER ARTHROPLASTY Right 02/18/2016   Procedure: RIGHT REVERSE SHOULDER ARTHROPLASTY;  Surgeon: Justice Britain, MD;  Location: [place];  Service: Orthopedics;  Laterality: Right;    There were no vitals filed for this visit.      Subjective Assessment - 10/03/16 1329    Subjective Pt. reporting he, "hasn't seen much difference with the pain over the weekend" however reporting good relief from ball massage.     Patient Stated Goals Reduce pain   Currently in  Pain? No/denies   Pain Score 0-No pain   Multiple Pain Sites No            [facility] PT Assessment - 10/03/16 1331      Observation/Other Assessments   Focus on Therapeutic Outcomes (FOTO)  56% (44% limitation)     Strength   Overall Strength Comments no pain with all MMT   Strength Assessment Site Hip;Knee   Right/Left Hip Right;Left   Right Hip Flexion 4/5   Right Hip Extension 4/5   Right Hip ABduction 4+/5   Left Hip Flexion 4+/5   Left Hip Extension 4+/5   Left Hip ABduction 4+/5   Right/Left Knee Right;Left   Right Knee Flexion 4+/5   Right Knee Extension 4+/5   Left Knee Flexion 4+/5   Left Knee Extension 5/5            [facility] Adult PT Treatment/Exercise - 10/03/16 1357      Knee/Hip Exercises: Aerobic   Nustep level 7 x 6 minutes     Knee/Hip Exercises: Standing   Forward Lunges Both;10 reps;2 sets   Forward Lunges Limitations 1st set on TRX; 2nd set with single hand-hold at counter  R piriformis massage due to R hip pain following 1st set    Side Lunges Limitations side  stepping 40  feet each way with red tband at ankles   Functional Squat 15 reps   Functional Squat Limitations at counter; VC for form still required; 5" hold      Knee/Hip Exercises: Supine   Single Leg Bridge AROM;Right;Left;10 reps;1 set  5" hold      Manual Therapy   Manual Therapy Soft tissue mobilization   Manual therapy comments STM/TPR with red med ball (1000Gr) to R piriformis area x 5 min; good tolerance with pt. confirming, "releasing, easing off of pain"    Soft tissue mobilization R piriformis   L sidelying with R LE on bolster              PT Short Term Goals - 04/07/16 1247      PT SHORT TERM GOAL #1   Title improve PROM to WNL for improved motion (04/11/16)   Baseline achieved within protocol limitations   Status Achieved     PT SHORT TERM GOAL #2   Title independent with initial HEP (04/11/16)   Status Achieved           PT Long Term Goals - 10/03/16  1323      PT LONG TERM GOAL #1   Title Patient to be independent with advanced HEP (10/12/16)   Status Partially Met  2.12.18: met for current     PT LONG TERM GOAL #2   Title Patient to improve R LE flexibility of HS, IT band and piriformis with good compliance with stretching independently (10/12/16)   Status Partially Met  2.12.18 Pt. reporting good compliance with R LE HS, IT band, and piriformis stretching activities at home      PT LONG TERM GOAL #3   Title Patient to report ability to return to household work with no increase in pain (10/12/16)   Status Partially Met  2.12.18; Pt. reporting he is pain free with household work with exception slight pain while squatting.       PT LONG TERM GOAL #4   Title Patient to improve B LE strength to >/= 4+/5 with no increase in pain (10/12/16)   Status Partially Met  2.12.18: Pt. able to meet for all with exception of R hip extension, and flexion still 4/5 strength      PT LONG TERM GOAL #5   Title patient to demonstrate supine to sit with pain no greater than 1/10 for greater than 2 weeks (10/12/16)   Status Partially Met  2.12.18: Pt. reporting he has been able to go from supine<>sit at home pain free.                Plan - 10/03/16 1319    Clinical Impression Statement Pt. reporting good R hip pain relief from ball massage performed over weekend stating he, "got three different balls from the [employer]".  Pt. reporting effectively using LE stretching and ball massage to help control pain over weekend. Pt. demonstrating improved LE strength with testing today as compared to testing a week ago.  Pt. has now partially met LE strength goal at 4+/5 strength for all with exception of R hip flexion, and extension strength still at 4/5.  Pt. reporting he is now pain free going from supine > sitting position when using technique learned in therapy.  Pt. also reports being pain free with household work with exception of, "occasional"  pain while squatting to pick something up off floor.  Following goal testing, today's treatment focused on squatting and  lunging activities.  Pt. with onset of R hip pain following TRX lunge however finding relief from piriformis med ball massage with therapist.  Pt. still requiring frequent cueing with all squatting and lunging for proper technique.  Pt. demonstrating progress toward goals and will continue to benefit from further skilled therapy to improve squatting technique, improve LE strength and flexibility, and decrease pain with functional activities.     PT Treatment/Interventions ADLs/Self Care Home Management;Cryotherapy;Electrical Stimulation;Moist Heat;Ultrasound;Neuromuscular re-education;Balance training;Therapeutic exercise;Therapeutic activities;Functional mobility training;Stair training;Gait training;Patient/family education;Manual techniques;Passive range of motion;Vasopneumatic Device;Taping;Dry needling   PT Next Visit Plan hip extension, abduction strengthening - progressing towards functional strengthening      Patient will benefit from skilled therapeutic intervention in order to improve the following deficits and impairments:  Decreased activity tolerance, Decreased range of motion, Decreased strength, Increased muscle spasms, Pain  Visit Diagnosis: Pain in right hip  Muscle weakness (generalized)       G-Codes - October 30, 2016 1445    Functional Assessment Tool Used FOTO: 56% (44% limitation)   Functional Limitation Mobility: Walking and moving around   Mobility: Walking and Moving Around Current Status [phone]) At least 40 percent but less than 60 percent impaired, limited or restricted   Mobility: Walking and Moving Around Goal Status [phone]) At least 20 percent but less than 40 percent impaired, limited or restricted      Problem List Patient Active Problem List   Diagnosis Date Noted  . 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 Impingement syndrome of  right shoulder 08/07/2014  . Pain in the chest 02/09/2014  . Shortness of breath 02/09/2014  . Gastroesophageal reflux disease without esophagitis 02/09/2014  . Other and unspecified hyperlipidemia 09/08/2013  . Hereditary and idiopathic peripheral neuropathy 07/22/2013  . Allergic rhinitis, cause unspecified 07/14/2013  . Pain in joint, pelvic region and thigh 07/14/2013  . Elevated blood pressure 05/11/2013  . Insomnia 05/11/2013  . BPH (benign prostatic hypertrophy) 05/11/2013  . Helicobacter pylori (H. pylori) 02/14/2013  . Irritable bowel syndrome 02/14/2013  . Encounter for therapeutic drug monitoring 02/09/2013  . GERD (gastroesophageal reflux disease) 02/09/2013    Bess Harvest, PTA 10/04/16 1:00 PM  [facility] [address]  [place], [place], [address] Phone: [phone]   Fax:  [phone]  Name: Julien Oscar MRN: [id number] Date of Birth: 07-17-1946

## 2016-10-06 ENCOUNTER — Ambulatory Visit: Payer: Medicare Other | Admitting: Physical Therapy

## 2016-10-06 DIAGNOSIS — M6281 Muscle weakness (generalized): Secondary | ICD-10-CM

## 2016-10-06 DIAGNOSIS — M25551 Pain in right hip: Secondary | ICD-10-CM | POA: Diagnosis not present

## 2016-10-06 NOTE — Patient Instructions (Signed)
Bridging (Single Leg)    Lie on back with feet shoulder width apart and right leg straight. Lift hips toward the ceiling while keeping leg straight. Hold __5__ seconds. Repeat __15__ times. Do _2___ sessions per day.

## 2016-10-06 NOTE — Therapy (Signed)
[facility] [address]  [place], [place], [address] Phone: [phone]   Fax:  [phone]  Physical Therapy Treatment  Patient Details  Name: Jon Bush MRN: [id number] Date of Birth: 1946/04/12 Referring Provider: Judith Part. [place], [place]  Encounter Date: 10/06/2016      PT End of Session - 10/06/16 0935    Visit Number 11   Number of Visits 12   Date for PT Re-Evaluation 10/12/16   PT Start Time 0930   PT Stop Time 1023  moist heat   PT Time Calculation (min) 53 min   Activity Tolerance Patient tolerated treatment well   Behavior During Therapy [facility] for tasks assessed/performed      Past Medical History:  Diagnosis Date  . Amaurosis fugax of left eye   . Anemia   . BPH (benign prostatic hyperplasia)   . Central retinal vein occlusion of left eye    (Left) Dr. Fenton Foy [facility]); Dr. Newell Coral [facility])   . Enlarged prostate   . H. pylori infection 02/14/2013  . High cholesterol   . Hypertension   . IBS (irritable bowel syndrome)   . Kidney stone   . Kidney stones   . Macular hole of left eye 2005  . Migraine headache   . Retinal detachment 2005  . Sleep apnea    cpap    Past Surgical History:  Procedure Laterality Date  . HERNIA REPAIR    . KIDNEY STONE SURGERY    . LITHOTRIPSY    . RETINAL DETACHMENT SURGERY    . REVERSE SHOULDER ARTHROPLASTY Right 02/18/2016  . REVERSE SHOULDER ARTHROPLASTY Right 02/18/2016   Procedure: RIGHT REVERSE SHOULDER ARTHROPLASTY;  Surgeon: Justice Britain, MD;  Location: [place];  Service: Orthopedics;  Laterality: Right;    There were no vitals filed for this visit.      Subjective Assessment - 10/06/16 0933    Subjective Patient reporting R buttock pain - woke up with no pain, however pain started after being up a while   Patient Stated Goals Reduce pain   Currently in Pain? Yes   Pain Score 3    Pain Location Buttocks   Pain Orientation Right    Pain Descriptors / Indicators Aching   Pain Type Acute pain   Pain Onset 1 to 4 weeks ago   Pain Frequency Intermittent   Aggravating Factors  bending, twisting, picking up things   Pain Relieving Factors ibuprofen, avoiding housework                         [facility] Adult PT Treatment/Exercise - 10/06/16 0001      Knee/Hip Exercises: Stretches   Passive Hamstring Stretch Right;3 reps;30 seconds   Passive Hamstring Stretch Limitations seated with R LE extended   Piriformis Stretch Right;3 reps;30 seconds   Piriformis Stretch Limitations seated figure 4 with trunk flexion     Knee/Hip Exercises: Aerobic   Nustep level 6 x 6 minutes     Knee/Hip Exercises: Standing   Forward Lunges Both;10 reps   Forward Lunges Limitations 1 hand support on chair   Side Lunges Limitations side stepping approx 30 feet each way - green tband at ankles   Functional Squat 10 reps   Functional Squat Limitations to low box - for hopeful progression to squat to floor for functional carryover (i.e. picking up object form floor)   Other Standing Knee Exercises backward monster walk -  green tband at ankles - VC for large steps     Knee/Hip Exercises: Supine   Bridges Limitations 15 reps with green tband at knees   Single Leg Bridge Right;15 reps   Other Supine Knee/Hip Exercises hooklying clam (single leg fallout) x 15 reps with green tband at knees   Other Supine Knee/Hip Exercises straight leg bridge with B LE extended on peanut ball x 15 reps - 3-5 second hold at top     Knee/Hip Exercises: Sidelying   Hip ABduction Strengthening;Right;15 reps   Hip ABduction Limitations 2#     Modalities   Modalities Moist Heat     Moist Heat Therapy   Number Minutes Moist Heat 12 Minutes   Moist Heat Location Hip                  PT Short Term Goals - 04/07/16 1247      PT SHORT TERM GOAL #1   Title improve PROM to WNL for improved motion (04/11/16)   Baseline achieved within  protocol limitations   Status Achieved     PT SHORT TERM GOAL #2   Title independent with initial HEP (04/11/16)   Status Achieved           PT Long Term Goals - 10/03/16 1323      PT LONG TERM GOAL #1   Title Patient to be independent with advanced HEP (10/12/16)   Status Partially Met  2.12.18: met for current     PT LONG TERM GOAL #2   Title Patient to improve R LE flexibility of HS, IT band and piriformis with good compliance with stretching independently (10/12/16)   Status Partially Met  2.12.18 Pt. reporting good compliance with R LE HS, IT band, and piriformis stretching activities at home      PT LONG TERM GOAL #3   Title Patient to report ability to return to household work with no increase in pain (10/12/16)   Status Partially Met  2.12.18; Pt. reporting he is pain free with household work with exception slight pain while squatting.       PT LONG TERM GOAL #4   Title Patient to improve B LE strength to >/= 4+/5 with no increase in pain (10/12/16)   Status Partially Met  2.12.18: Pt. able to meet for all with exception of R hip extension, and flexion still 4/5 strength      PT LONG TERM GOAL #5   Title patient to demonstrate supine to sit with pain no greater than 1/10 for greater than 2 weeks (10/12/16)   Status Partially Met  2.12.18: Pt. reporting he has been able to go from supine<>sit at home pain free.                Plan - 10/06/16 0935    Clinical Impression Statement Jon Bush doing well today - no pain when initially waking this morning, however, pain did start to be more pronounced as the morning went on. Patient reporitng use of ball for self-massage at home with good relief. Patient doing well with all hip strengthening with PT focusing more on function strengthening such as squatting to low surfaces as well as lunges to allow patient to be able to pick up objects from floor without exacerbating pain. Patient to continue to beneift form PT to  maximize function and mobility.    PT Treatment/Interventions ADLs/Self Care Home Management;Cryotherapy;Electrical Stimulation;Moist Heat;Ultrasound;Neuromuscular re-education;Balance training;Therapeutic exercise;Therapeutic activities;Functional mobility training;Stair training;Gait training;Patient/family education;Manual techniques;Passive range  of motion;Vasopneumatic Device;Taping;Dry needling   PT Next Visit Plan functional strength and activities.    Consulted and Agree with Plan of Care Patient      Patient will benefit from skilled therapeutic intervention in order to improve the following deficits and impairments:  Decreased activity tolerance, Decreased range of motion, Decreased strength, Increased muscle spasms, Pain  Visit Diagnosis: Pain in right hip  Muscle weakness (generalized)     Problem List Patient Active Problem List   Diagnosis Date Noted  . 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 Impingement syndrome of right shoulder 08/07/2014  . Pain in the chest 02/09/2014  . Shortness of breath 02/09/2014  . Gastroesophageal reflux disease without esophagitis 02/09/2014  . Other and unspecified hyperlipidemia 09/08/2013  . Hereditary and idiopathic peripheral neuropathy 07/22/2013  . Allergic rhinitis, cause unspecified 07/14/2013  . Pain in joint, pelvic region and thigh 07/14/2013  . Elevated blood pressure 05/11/2013  . Insomnia 05/11/2013  . BPH (benign prostatic hypertrophy) 05/11/2013  . Helicobacter pylori (H. pylori) 02/14/2013  . Irritable bowel syndrome 02/14/2013  . Encounter for therapeutic drug monitoring 02/09/2013  . GERD (gastroesophageal reflux disease) 02/09/2013    Lanney Gins, PT, DPT 10/06/16 1:51 PM   [facility] [address]  [place], [place], [address] Phone: [phone]   Fax:  [phone]  Name: Jon Bush MRN: [id number] Date of Birth:  08-20-1946

## 2016-10-11 ENCOUNTER — Ambulatory Visit: Payer: Medicare Other

## 2016-10-11 DIAGNOSIS — M25551 Pain in right hip: Secondary | ICD-10-CM

## 2016-10-11 DIAGNOSIS — M6281 Muscle weakness (generalized): Secondary | ICD-10-CM

## 2016-10-11 NOTE — Therapy (Signed)
[facility] [address]  [place], [place], [address] Phone: [phone]   Fax:  [phone]  Physical Therapy Treatment  Patient Details  Name: Jon Bush MRN: [id number] Date of Birth: [birth year] Referring Provider: Judith Part. [place], [place]  Encounter Date: 10/11/2016      PT End of Session - 10/11/16 6962    Visit Number 12   Number of Visits 20   Date for PT Re-Evaluation 11/09/16   PT Start Time 0934   PT Stop Time 1024   PT Time Calculation (min) 50 min   Activity Tolerance Patient tolerated treatment well   Behavior During Therapy [facility] for tasks assessed/performed      Past Medical History:  Diagnosis Date  . Amaurosis fugax of left eye   . Anemia   . BPH (benign prostatic hyperplasia)   . Central retinal vein occlusion of left eye    (Left) Dr. Fenton Foy [facility]); Dr. Newell Coral [facility])   . Enlarged prostate   . H. pylori infection 02/14/2013  . High cholesterol   . Hypertension   . IBS (irritable bowel syndrome)   . Kidney stone   . Kidney stones   . Macular hole of left eye 2005  . Migraine headache   . Retinal detachment 2005  . Sleep apnea    cpap    Past Surgical History:  Procedure Laterality Date  . HERNIA REPAIR    . KIDNEY STONE SURGERY    . LITHOTRIPSY    . RETINAL DETACHMENT SURGERY    . REVERSE SHOULDER ARTHROPLASTY Right 02/18/2016  . REVERSE SHOULDER ARTHROPLASTY Right 02/18/2016   Procedure: RIGHT REVERSE SHOULDER ARTHROPLASTY;  Surgeon: Justice Britain, MD;  Location: [place];  Service: Orthopedics;  Laterality: Right;    There were no vitals filed for this visit.      Subjective Assessment - 10/11/16 0934    Subjective Pt. reporting he feels the HEP stretches, ball massage, and biking at gym helps manage his pain.  Pt. reporting he still requires over the counter medications to relieve pain.     Patient Stated Goals Reduce pain   Currently in Pain? Yes   Pain Score 6    Pain Location Buttocks   Pain Orientation Right   Pain Descriptors / Indicators Aching   Pain Type Acute pain   Multiple Pain Sites No            [facility] PT Assessment - 10/11/16 0948      Strength   Right/Left Hip Right;Left   Right Hip Flexion 4/5   Right Hip Extension 4+/5   Right Hip ABduction 4+/5   Left Hip Flexion 4+/5   Left Hip Extension 4+/5   Left Hip ABduction 4+/5   Right/Left Knee Right;Left   Right Knee Flexion 4+/5   Right Knee Extension 5/5   Left Knee Flexion 4+/5   Left Knee Extension 5/5            [facility] Adult PT Treatment/Exercise - 10/11/16 1015      Self-Care   Self-Care Other Self-Care Comments   Other Self-Care Comments  Education on proper kneeling technique to facilitate reduced pain while picking up objects off floor; kneeling onto blue foam airex pad 4 x 5 reps each leg; cues provided throughout for knee over toe, and upright posture; first two sets taken mid way through treatment and last two sets taken to end treatment to check for carryover; pt. with moderate  carryover of proper technique; pt. current functional status discussed with review of postural/body mechanics handout; kneeling technique discussed with pt. with therapist demonstration of proper technique     Knee/Hip Exercises: Aerobic   Nustep level 6 x 6 minutes     Knee/Hip Exercises: Standing   Forward Lunges Right;Left;10 reps;2 sets   Forward Lunges Limitations 1 hand support on counter    Functional Squat 15 reps   Functional Squat Limitations holding onto counter    Other Standing Knee Exercises side-stepping, Forward/backward monster walk - green tband at ankles 2 x 30 ft each way - improved technique today                   PT Short Term Goals - 04/07/16 1247      PT SHORT TERM GOAL #1   Title improve PROM to WNL for improved motion (04/11/16)   Baseline achieved within protocol limitations   Status Achieved     PT SHORT TERM GOAL #2    Title independent with initial HEP (04/11/16)   Status Achieved           PT Long Term Goals - 10/11/16 0943      PT LONG TERM GOAL #1   Title Patient to be independent with advanced HEP (10/12/16)   Status Partially Met  2.20.18: met for current     PT LONG TERM GOAL #2   Title Patient to improve R LE flexibility of HS, IT band and piriformis with good compliance with stretching independently (10/12/16)   Status Partially Met  2.20.18 Pt. reporting good compliance with R LE HS, IT band, and piriformis stretching activities at home      PT LONG TERM GOAL #3   Title Patient to report ability to return to household work with no increase in pain (10/12/16)   Status Partially Met  2.20.18; Pt. reporting he is pain free with household work with exception of bending down quickly to pick up something off floor.       PT LONG TERM GOAL #4   Title Patient to improve B LE strength to >/= 4+/5 with no increase in pain (10/12/16)   Status Partially Met  2.20.18: Pt. able to meet for all with exception of R hip flexion still 4/5 strength      PT LONG TERM GOAL #5   Title patient to demonstrate supine to sit with pain no greater than 1/10 for greater than 2 weeks (10/12/16)   Status Achieved  2.20.18: Pt. reporting pain free with this movement at home                Plan - 10/11/16 0957    Clinical Impression Statement Pt. with high initial R buttocks pain, reporting, "I was not able to do the stretches this morning that's why it's worse".  Pt. still reporting effective pain management with HEP stretching, ball massage, and riding stationary bike at gym.  This pain level decreased throughout treatment today with pt. ending pain free.   Pt. partially meeting goals with testing today.  Pt. has now been pain free with supine<>sit for two weeks and thus has achieved this goal today.  Pt. showing strength improvement with testing today meeting all with exception of R hip flexion still 4/5.   Supervising PT consulted today regarding pt. goal status.  PT recerting pt. for continued therapy today.  Pt. still reporting mild pain with picking objects up off floor.  Functional kneeling reviewed with pt.  today due to poor demo with this.  Moderate carry-over with kneeling technique however pt. would benefit from further instruction with this.  Pt. will continue to benefit from further skilled therapy to maximize function.     PT Frequency 2x / week   PT Duration 4 weeks   PT Treatment/Interventions ADLs/Self Care Home Management;Cryotherapy;Electrical Stimulation;Moist Heat;Ultrasound;Neuromuscular re-education;Balance training;Therapeutic exercise;Therapeutic activities;Functional mobility training;Stair training;Gait training;Patient/family education;Manual techniques;Passive range of motion;Vasopneumatic Device;Taping;Dry needling   PT Next Visit Plan functional strength and activities.       Patient will benefit from skilled therapeutic intervention in order to improve the following deficits and impairments:  Decreased activity tolerance, Decreased range of motion, Decreased strength, Increased muscle spasms, Pain  Visit Diagnosis: Pain in right hip  Muscle weakness (generalized)     Problem List Patient Active Problem List   Diagnosis Date Noted  . 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 Impingement syndrome of right shoulder 08/07/2014  . Pain in the chest 02/09/2014  . Shortness of breath 02/09/2014  . Gastroesophageal reflux disease without esophagitis 02/09/2014  . Other and unspecified hyperlipidemia 09/08/2013  . Hereditary and idiopathic peripheral neuropathy 07/22/2013  . Allergic rhinitis, cause unspecified 07/14/2013  . Pain in joint, pelvic region and thigh 07/14/2013  . Elevated blood pressure 05/11/2013  . Insomnia 05/11/2013  . BPH (benign prostatic hypertrophy) 05/11/2013  . Helicobacter pylori (H. pylori) 02/14/2013  . Irritable bowel  syndrome 02/14/2013  . Encounter for therapeutic drug monitoring 02/09/2013  . GERD (gastroesophageal reflux disease) 02/09/2013    Bess Harvest, PTA 10/11/16 1:00 PM   [facility] [address]  [place], [place], [address] Phone: [phone]   Fax:  [phone]  Name: Jon Bush MRN: [id number] Date of Birth: 1946/05/01

## 2016-10-14 ENCOUNTER — Ambulatory Visit: Payer: Medicare Other

## 2016-10-14 DIAGNOSIS — M6281 Muscle weakness (generalized): Secondary | ICD-10-CM

## 2016-10-14 DIAGNOSIS — M25551 Pain in right hip: Secondary | ICD-10-CM

## 2016-10-14 NOTE — Therapy (Signed)
[facility] [address]  [place], [place], [address] Phone: [phone]   Fax:  [phone]  Physical Therapy Treatment  Patient Details  Name: Jon Bush MRN: [id number] Date of Birth: June 03, 1946 Referring Provider: Judith Part. [place], [place]  Encounter Date: 10/14/2016      PT End of Session - 10/14/16 1020    Visit Number 13   Number of Visits 20   Date for PT Re-Evaluation 11/09/16   PT Start Time 0933   PT Stop Time 1027  10 min moist heat to end treatment    PT Time Calculation (min) 54 min   Activity Tolerance Patient tolerated treatment well   Behavior During Therapy [facility] for tasks assessed/performed      Past Medical History:  Diagnosis Date  . Amaurosis fugax of left eye   . Anemia   . BPH (benign prostatic hyperplasia)   . Central retinal vein occlusion of left eye    (Left) Dr. Fenton Foy [facility]); Dr. Newell Coral [facility])   . Enlarged prostate   . H. pylori infection 02/14/2013  . High cholesterol   . Hypertension   . IBS (irritable bowel syndrome)   . Kidney stone   . Kidney stones   . Macular hole of left eye 2005  . Migraine headache   . Retinal detachment 2005  . Sleep apnea    cpap    Past Surgical History:  Procedure Laterality Date  . HERNIA REPAIR    . KIDNEY STONE SURGERY    . LITHOTRIPSY    . RETINAL DETACHMENT SURGERY    . REVERSE SHOULDER ARTHROPLASTY Right 02/18/2016  . REVERSE SHOULDER ARTHROPLASTY Right 02/18/2016   Procedure: RIGHT REVERSE SHOULDER ARTHROPLASTY;  Surgeon: Justice Britain, MD;  Location: [place];  Service: Orthopedics;  Laterality: Right;    There were no vitals filed for this visit.      Subjective Assessment - 10/14/16 0935    Subjective Pt. reporting R buttocks pain while sitting and straining using bathroom yesterday.  Pt. reporting he felt, "sharp, pins and needles" pain while sitting yesterday however this was relieved by ball massage.   Pt. still reporting good relief with ball massage and LE stretching HEP at home.     Patient Stated Goals Reduce pain   Currently in Pain? Yes   Pain Score 4    Pain Location Buttocks   Pain Orientation Right   Pain Descriptors / Indicators Aching;Dull   Pain Type Acute pain   Pain Radiating Towards n/a   Pain Frequency Intermittent   Aggravating Factors  bending, straining while using bathroom    Pain Relieving Factors ibuprofen,    Multiple Pain Sites No          [facility] Adult PT Treatment/Exercise - 10/14/16 0945      Self-Care   Self-Care Other Self-Care Comments   Other Self-Care Comments  Review/demonstration of proper kneeling technique; pt. reporting he has been using this technique to pick up objects from floor at home with some pain relief with this; Pt. requiring mod cues today for knee over toe, posterior wt. shift, and upright posture; some carry over of technique from last visit; kneeling to airex pad with 1 UE support on chair 2 x 5 reps each way; kneeling to airex pad with UE support on thigh x 5 reps each way; pt. with onset of R buttocks pain following last set of kneeling; relieved with ball massage  Knee/Hip Exercises: Standing   Forward Lunges Right;Left;10 reps;1 set  x 12 reps each    Forward Lunges Limitations 1 hand support on counter; increased R buttocks pain following last set thus STM/TPR with med ball  Repeated verbal cues required for upright posture   Other Standing Knee Exercises Forward monster walk - green tband at ankles 3 x 30 ft each way      Moist Heat Therapy   Number Minutes Moist Heat 10 Minutes   Moist Heat Location Hip  hooklying     Manual Therapy   Manual Therapy Soft tissue mobilization   Manual therapy comments STM/TPR with red med ball (1000Gr) to R piriformis area 2 x 2 min following kneeling activities and onset of R buttocks pain; good relief from this allowing for continued kneeling/lunging activities following    Soft  tissue mobilization R piriformis with aggressive pressure with med ball (1000Gr)            PT Short Term Goals - 04/07/16 1247      PT SHORT TERM GOAL #1   Title improve PROM to WNL for improved motion (04/11/16)   Baseline achieved within protocol limitations   Status Achieved     PT SHORT TERM GOAL #2   Title independent with initial HEP (04/11/16)   Status Achieved           PT Long Term Goals - 10/11/16 0258      PT LONG TERM GOAL #1   Title Patient to be independent with advanced HEP (10/12/16)   Status Partially Met  2.20.18: met for current     PT LONG TERM GOAL #2   Title Patient to improve R LE flexibility of HS, IT band and piriformis with good compliance with stretching independently (10/12/16)   Status Partially Met  2.20.18 Pt. reporting good compliance with R LE HS, IT band, and piriformis stretching activities at home      PT LONG TERM GOAL #3   Title Patient to report ability to return to household work with no increase in pain (10/12/16)   Status Partially Met  2.20.18; Pt. reporting he is pain free with household work with exception of bending down quickly to pick up something off floor.       PT LONG TERM GOAL #4   Title Patient to improve B LE strength to >/= 4+/5 with no increase in pain (10/12/16)   Status Partially Met  2.20.18: Pt. able to meet for all with exception of R hip flexion still 4/5 strength      PT LONG TERM GOAL #5   Title patient to demonstrate supine to sit with pain no greater than 1/10 for greater than 2 weeks (10/12/16)   Status Achieved  2.20.18: Pt. reporting pain free with this movement at home                Plan - 10/14/16 1001    Clinical Impression Statement Pt. reporting he felt onset of sharp, pins/needles pain while straining, using bathroom yesterday.  Pt. feels current medications are making him constipated.  Pt. instructed to inform MD if this continues.  Pt. reporting he still has R buttocks pain with  sitting occasionally and picking up things off floor.  Pt. has been using kneeling technique at home with some pain relief however feels he is still, "shaky" with this.  Pt. reporting continued relief of R buttocks pain from bike, and ball massage at home.  Today's treatment focusing  on review/demo of kneeling technique with functional lunging type activities continued.  Pt. with onset of increased R buttocks pain x 2 with therex today however this was relieved by med ball massage to R buttocks.  Pt. will continue to benefit from further skilled therapy to improve technique with functional activities, improve LE strength, and reduce pain with ADL's.     PT Treatment/Interventions ADLs/Self Care Home Management;Cryotherapy;Electrical Stimulation;Moist Heat;Ultrasound;Neuromuscular re-education;Balance training;Therapeutic exercise;Therapeutic activities;Functional mobility training;Stair training;Gait training;Patient/family education;Manual techniques;Passive range of motion;Vasopneumatic Device;Taping;Dry needling   PT Next Visit Plan Review of functioal kneeling prn; functional strength and activities.       Patient will benefit from skilled therapeutic intervention in order to improve the following deficits and impairments:  Decreased activity tolerance, Decreased range of motion, Decreased strength, Increased muscle spasms, Pain  Visit Diagnosis: Pain in right hip  Muscle weakness (generalized)     Problem List Patient Active Problem List   Diagnosis Date Noted  . 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 Impingement syndrome of right shoulder 08/07/2014  . Pain in the chest 02/09/2014  . Shortness of breath 02/09/2014  . Gastroesophageal reflux disease without esophagitis 02/09/2014  . Other and unspecified hyperlipidemia 09/08/2013  . Hereditary and idiopathic peripheral neuropathy 07/22/2013  . Allergic rhinitis, cause unspecified 07/14/2013  . Pain in joint, pelvic  region and thigh 07/14/2013  . Elevated blood pressure 05/11/2013  . Insomnia 05/11/2013  . BPH (benign prostatic hypertrophy) 05/11/2013  . Helicobacter pylori (H. pylori) 02/14/2013  . Irritable bowel syndrome 02/14/2013  . Encounter for therapeutic drug monitoring 02/09/2013  . GERD (gastroesophageal reflux disease) 02/09/2013    Bess Harvest, PTA 10/14/16 10:48 AM  [facility] [address]  [place], [place], [address] Phone: [phone]   Fax:  [phone]  Name: Jon Bush MRN: [id number] Date of Birth: March 05, 1946

## 2016-10-15 ENCOUNTER — Encounter (HOSPITAL_BASED_OUTPATIENT_CLINIC_OR_DEPARTMENT_OTHER): Payer: Self-pay | Admitting: Emergency Medicine

## 2016-10-15 ENCOUNTER — Emergency Department (HOSPITAL_BASED_OUTPATIENT_CLINIC_OR_DEPARTMENT_OTHER): Payer: Medicare Other

## 2016-10-15 ENCOUNTER — Emergency Department (HOSPITAL_BASED_OUTPATIENT_CLINIC_OR_DEPARTMENT_OTHER)
Admission: EM | Admit: 2016-10-15 | Discharge: 2016-10-15 | Disposition: A | Discharge status: Home / Self Care | Payer: Medicare Other | Attending: Emergency Medicine | Admitting: Emergency Medicine

## 2016-10-15 DIAGNOSIS — Z79899 Other long term (current) drug therapy: Secondary | ICD-10-CM | POA: Insufficient documentation

## 2016-10-15 DIAGNOSIS — Z7982 Long term (current) use of aspirin: Secondary | ICD-10-CM | POA: Insufficient documentation

## 2016-10-15 DIAGNOSIS — K529 Noninfective gastroenteritis and colitis, unspecified: Secondary | ICD-10-CM | POA: Insufficient documentation

## 2016-10-15 DIAGNOSIS — I10 Essential (primary) hypertension: Secondary | ICD-10-CM | POA: Insufficient documentation

## 2016-10-15 DIAGNOSIS — R197 Diarrhea, unspecified: Secondary | ICD-10-CM

## 2016-10-15 DIAGNOSIS — R1013 Epigastric pain: Secondary | ICD-10-CM

## 2016-10-15 LAB — COMPREHENSIVE METABOLIC PANEL
ALBUMIN: 4.2 g/dL (ref 3.5–5.0)
ALT: 26 U/L (ref 17–63)
ANION GAP: 8 (ref 5–15)
AST: 30 U/L (ref 15–41)
Alkaline Phosphatase: 78 U/L (ref 38–126)
BILIRUBIN TOTAL: 1.2 mg/dL (ref 0.3–1.2)
BUN: 31 mg/dL — AB (ref 6–20)
CO2: 33 mmol/L — AB (ref 22–32)
Calcium: 9.3 mg/dL (ref 8.9–10.3)
Chloride: 96 mmol/L — ABNORMAL LOW (ref 101–111)
Creatinine, Ser: 1.54 mg/dL — ABNORMAL HIGH (ref 0.61–1.24)
GFR calc Af Amer: 50 mL/min — ABNORMAL LOW (ref >60)
GFR calc non Af Amer: 43 mL/min — ABNORMAL LOW (ref >60)
GLUCOSE: 129 mg/dL — AB (ref 65–99)
POTASSIUM: 3.5 mmol/L (ref 3.5–5.1)
SODIUM: 137 mmol/L (ref 135–145)
TOTAL PROTEIN: 7.4 g/dL (ref 6.5–8.1)

## 2016-10-15 LAB — CBC WITH DIFFERENTIAL/PLATELET
BASOS ABS: 0 10*3/uL (ref 0.0–0.1)
BASOS PCT: 0 %
EOS ABS: 0.1 10*3/uL (ref 0.0–0.7)
Eosinophils Relative: 1 %
HEMATOCRIT: 39.6 % (ref 39.0–52.0)
HEMOGLOBIN: 13.6 g/dL (ref 13.0–17.0)
Lymphocytes Relative: 6 %
Lymphs Abs: 0.6 10*3/uL — ABNORMAL LOW (ref 0.7–4.0)
MCH: 33.2 pg (ref 26.0–34.0)
MCHC: 34.3 g/dL (ref 30.0–36.0)
MCV: 96.6 fL (ref 78.0–100.0)
Monocytes Absolute: 1.5 10*3/uL — ABNORMAL HIGH (ref 0.1–1.0)
Monocytes Relative: 14 %
NEUTROS ABS: 8.8 10*3/uL — AB (ref 1.7–7.7)
NEUTROS PCT: 79 %
Platelets: 278 10*3/uL (ref 150–400)
RBC: 4.1 MIL/uL — ABNORMAL LOW (ref 4.22–5.81)
RDW: 13.8 % (ref 11.5–15.5)
WBC: 11.1 10*3/uL — ABNORMAL HIGH (ref 4.0–10.5)

## 2016-10-15 LAB — TROPONIN I: Troponin I: <0.03 ng/mL (ref <0.03)

## 2016-10-15 LAB — URINALYSIS, ROUTINE W REFLEX MICROSCOPIC
Bilirubin Urine: NEGATIVE
GLUCOSE, UA: NEGATIVE mg/dL
Hgb urine dipstick: NEGATIVE
Ketones, ur: NEGATIVE mg/dL
NITRITE: NEGATIVE
PH: 8 (ref 5.0–8.0)
Protein, ur: NEGATIVE mg/dL
SPECIFIC GRAVITY, URINE: 1.022 (ref 1.005–1.030)

## 2016-10-15 LAB — URINALYSIS, MICROSCOPIC (REFLEX)

## 2016-10-15 LAB — LIPASE, BLOOD: Lipase: 19 U/L (ref 11–51)

## 2016-10-15 MED ORDER — GI COCKTAIL ~~LOC~~
30.0000 mL | Freq: Once | ORAL | Status: AC
  Administered 2016-10-15: 30 mL via ORAL
  Filled 2016-10-15: qty 30

## 2016-10-15 MED ORDER — NITROGLYCERIN 0.4 MG SL SUBL: 0.4000 mg | SUBLINGUAL_TABLET | SUBLINGUAL | Status: DC | PRN

## 2016-10-15 MED ORDER — SODIUM CHLORIDE 0.9 % IV BOLUS (SEPSIS)
1000.0000 mL | Freq: Once | INTRAVENOUS | Status: AC
  Administered 2016-10-15: 1000 mL via INTRAVENOUS

## 2016-10-15 MED ORDER — ONDANSETRON 4 MG PO TBDP: 4.0000 mg | ORAL_TABLET | Freq: Three times a day (TID) | ORAL | 0 refills | Status: DC | PRN

## 2016-10-15 NOTE — ED Notes (Signed)
ED Provider at bedside. 

## 2016-10-15 NOTE — ED Provider Notes (Signed)
[facility] Provider Note   CSN: [id number] Arrival date & time: 10/15/16  1707  By signing my name below, I, Modena Jansky, attest that this documentation has been prepared under the direction and in the presence of Alvira Monday, MD. Electronically Signed: Modena Jansky, Scribe. 10/15/2016. 6:10 PM.  History   Chief Complaint Chief Complaint  Patient presents with  . Diarrhea  . Abdominal Pain   The history is provided by the patient. No language interpreter was used.   HPI Comments: Jon Bush is a 71 y.o. male who presents to the Emergency Department complaining of constant moderate epigastric abdominal pain that started today. His pain was unrelieved by antacid and exacerbated by nothing. It is burning in quality and severe, located in the upper abdomen and lower chest. He reports associated watery diarrhea (8 episodes daily) over the past 2 days and cough after lunch today. He took imodium without any relief. He admits to a hx of less severe epigastric pain. He denies any hx of cardiac problems, sick contacts, fever, diaphoresis, rhinorrhea, SOB, lower abdominal pain, blood in stool, vomiting, or nausea.     PCP: Birdena Jubilee, MD  Past Medical History:  Diagnosis Date  . Amaurosis fugax of left eye   . Anemia   . BPH (benign prostatic hyperplasia)   . Central retinal vein occlusion of left eye    (Left) Dr. Guy Begin [facility] Dr. [facility])   . Enlarged prostate   . H. pylori infection 02/14/2013  . High cholesterol   . Hypertension   . IBS (irritable bowel syndrome)   . Kidney stone   . Kidney stones   . Macular hole of left eye 2005  . Migraine headache   . Retinal detachment 2005  . Sleep apnea    cpap    Patient Active Problem List   Diagnosis Date Noted  . 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 Impingement syndrome of right shoulder 08/07/2014  . Pain in the chest 02/09/2014  . Shortness of breath  02/09/2014  . Gastroesophageal reflux disease without esophagitis 02/09/2014  . Other and unspecified hyperlipidemia 09/08/2013  . Hereditary and idiopathic peripheral neuropathy 07/22/2013  . Allergic rhinitis, cause unspecified 07/14/2013  . Pain in joint, pelvic region and thigh 07/14/2013  . Elevated blood pressure 05/11/2013  . Insomnia 05/11/2013  . BPH (benign prostatic hypertrophy) 05/11/2013  . Helicobacter pylori (H. pylori) 02/14/2013  . Irritable bowel syndrome 02/14/2013  . Encounter for therapeutic drug monitoring 02/09/2013  . GERD (gastroesophageal reflux disease) 02/09/2013    Past Surgical History:  Procedure Laterality Date  . HERNIA REPAIR    . KIDNEY STONE SURGERY    . LITHOTRIPSY    . RETINAL DETACHMENT SURGERY    . REVERSE SHOULDER ARTHROPLASTY Right 02/18/2016  . REVERSE SHOULDER ARTHROPLASTY Right 02/18/2016   Procedure: RIGHT REVERSE SHOULDER ARTHROPLASTY;  Surgeon: Francena Hanly, MD;  Location: [facility];  Service: Orthopedics;  Laterality: Right;       Home Medications    Prior to Admission medications   Medication Sig Start Date End Date Taking? Authorizing Provider  amoxicillin (AMOXIL) 500 MG capsule Take 2,000 mg by mouth See admin instructions. Take before dental procedures 05/22/13  Yes Historical Provider, MD  aspirin 81 MG tablet Take 81 mg by mouth daily.   Yes Historical Provider, MD  Calcium Citrate-Vitamin D (CALCIUM + D PO) Take 1 tablet by mouth daily.   Yes Historical Provider, MD  diclofenac sodium (VOLTAREN)  1 % GEL Apply 1 application topically 3 (three) times daily as needed.    Yes Historical Provider, MD  ferrous sulfate 325 (65 FE) MG tablet Take 325 mg by mouth daily with breakfast.   Yes Historical Provider, MD  folic acid (FOLVITE) 400 MCG tablet Take 400 mcg by mouth daily.   Yes Historical Provider, MD  hydrOXYzine (ATARAX/VISTARIL) 25 MG tablet Take 1 tablet (25 mg total) by mouth every 4 (four) hours as needed for itching.  10/11/13  Yes Gillian Scarce, MD  mometasone (NASONEX) 50 MCG/ACT nasal spray Place 2 sprays into the nose daily.   Yes Historical Provider, MD  Multiple Vitamins-Minerals (MULTIVITAMIN MEN PO) Take 1 tablet by mouth daily.   Yes Historical Provider, MD  pravastatin (PRAVACHOL) 80 MG tablet Take 1 tablet (80 mg total) by mouth at bedtime. 06/27/13 10/15/16 Yes Gillian Scarce, MD  telmisartan-hydrochlorothiazide (MICARDIS HCT) 40-12.5 MG tablet Take 1 tablet by mouth every morning. 02/01/16  Yes Historical Provider, MD  diazepam (VALIUM) 5 MG tablet Take 0.5-1 tablets (2.5-5 mg total) by mouth every 6 (six) hours as needed for muscle spasms or sedation. Patient not taking: Reported on 08/31/2016 02/19/16   Ralene Bathe, PA-C  HYDROmorphone (DILAUDID) 2 MG tablet Take 1-2 tablets (2-4 mg total) by mouth every 4 (four) hours as needed (for severe pain not controlled by percocet). Patient not taking: Reported on 08/31/2016 02/19/16   Ralene Bathe, PA-C  ondansetron (ZOFRAN ODT) 4 MG disintegrating tablet Take 1 tablet (4 mg total) by mouth every 8 (eight) hours as needed for nausea or vomiting. 10/15/16   Alvira Monday, MD  oxyCODONE-acetaminophen (PERCOCET) 5-325 MG tablet Take 1-2 tablets by mouth every 4 (four) hours as needed. Patient not taking: Reported on 08/31/2016 02/19/16   Ralene Bathe, PA-C  pantoprazole (PROTONIX) 40 MG tablet Take 40 mg by mouth 2 (two) times daily before a meal.     Historical Provider, MD  potassium citrate (UROCIT-K) 10 MEQ (1080 MG) SR tablet Take 20 mEq by mouth 2 (two) times daily.    Historical Provider, MD  ranitidine (ZANTAC) 150 MG capsule Take 150 mg by mouth at bedtime as needed for heartburn.    Historical Provider, MD    Family History Family History  Problem Relation Age of Onset  . Heart disease Mother   . Hypertension Mother   . Hyperlipidemia Mother   . Heart attack Mother   . Heart disease Father   . Alzheimer's disease Father   . Diabetes Neg Hx      Social History Social History  Substance Use Topics  . Smoking status: Never Smoker  . Smokeless tobacco: Never Used  . Alcohol use 0.0 oz/week     Comment: Rarely     Allergies   Codeine   Review of Systems Review of Systems  Constitutional: Negative for diaphoresis and fever.  HENT: Negative for sore throat.   Eyes: Negative for visual disturbance.  Respiratory: Negative for shortness of breath.   Cardiovascular: Positive for chest pain (lower chest/epigastric).  Gastrointestinal: Positive for abdominal pain (Epigastric), diarrhea and nausea. Negative for blood in stool and vomiting.  Genitourinary: Negative for difficulty urinating and dysuria.  Musculoskeletal: Negative for back pain and neck stiffness.  Skin: Negative for rash.  Neurological: Negative for syncope and headaches.     Physical Exam Updated Vital Signs BP 120/71 (BP Location: Left Arm)   Pulse 91   Temp 98.1 F (36.7 C) (Oral)   Resp 18  Ht 5\' 9"  (1.753 m)   Wt 193 lb (87.5 kg)   SpO2 95%   BMI 28.50 kg/m   Physical Exam  Constitutional: He is oriented to person, place, and time. He appears well-developed and well-nourished. No distress.  HENT:  Head: Normocephalic and atraumatic.  Eyes: Conjunctivae and EOM are normal.  Neck: Normal range of motion. Neck supple.  Cardiovascular: Normal rate, regular rhythm, normal heart sounds and intact distal pulses.  Exam reveals no gallop and no friction rub.   No murmur heard. Pulmonary/Chest: Effort normal and breath sounds normal. No respiratory distress. He has no wheezes. He has no rales.  Abdominal: Soft. He exhibits no distension. There is no tenderness. There is no guarding.  Musculoskeletal: Normal range of motion. He exhibits no edema.  Neurological: He is alert and oriented to person, place, and time.  Skin: Skin is warm and dry. He is not diaphoretic.  Psychiatric: He has a normal mood and affect.  Nursing note and vitals  reviewed.    ED Treatments / Results  DIAGNOSTIC STUDIES: Oxygen Saturation is 95% on RA, normal by my interpretation.    COORDINATION OF CARE: 6:14 PM- Pt advised of plan for treatment and pt agrees.  Labs (all labs ordered are listed, but only abnormal results are displayed) Labs Reviewed  CBC WITH DIFFERENTIAL/PLATELET - Abnormal; Notable for the following:       Result Value   WBC 11.1 (*)    RBC 4.10 (*)    Neutro Abs 8.8 (*)    Lymphs Abs 0.6 (*)    Monocytes Absolute 1.5 (*)    All other components within normal limits  COMPREHENSIVE METABOLIC PANEL - Abnormal; Notable for the following:    Chloride 96 (*)    CO2 33 (*)    Glucose, Bld 129 (*)    BUN 31 (*)    Creatinine, Ser 1.54 (*)    GFR calc non Af Amer 43 (*)    GFR calc Af Amer 50 (*)    All other components within normal limits  URINALYSIS, ROUTINE W REFLEX MICROSCOPIC - Abnormal; Notable for the following:    Leukocytes, UA MODERATE (*)    All other components within normal limits  URINALYSIS, MICROSCOPIC (REFLEX) - Abnormal; Notable for the following:    Bacteria, UA RARE (*)    Squamous Epithelial / LPF 0-5 (*)    All other components within normal limits  LIPASE, BLOOD  TROPONIN I  TROPONIN I    EKG  EKG Interpretation  Date/Time:  Saturday October 15 2016 17:15:21 EST Ventricular Rate:  87 PR Interval:  210 QRS Duration: 94 QT Interval:  346 QTC Calculation: 416 R Axis:   -21 Text Interpretation:  Sinus rhythm with 1st degree A-V block with Fusion complexes Incomplete right bundle branch block Cannot rule out Anterior infarct , age undetermined Abnormal ECG No significant change since last tracing Confirmed by [facility] MD, Toneka Fullen ([id number]) on 10/15/2016 6:18:58 PM       Radiology Dg Chest 2 View  Result Date: 10/15/2016 CLINICAL DATA:  Gastric/lower chest pain. No fever. Htn. No diab. No cough or congestion. No hx mi or stroke. EXAM: CHEST  2 VIEW COMPARISON:  06/19/2011 FINDINGS: The  cardiac silhouette is borderline enlarged. No mediastinal or hilar masses. No convincing adenopathy. Lungs are clear.  No pleural effusion.  No pneumothorax. Right shoulder reverse prosthesis is new since the prior exam, incompletely imaged, but well aligned. Skeletal structures are demineralized but otherwise intact.  IMPRESSION: No acute cardiopulmonary disease. Electronically Signed   By: Amie Portlandavid  Ormond M.D.   On: 10/15/2016 20:12    Procedures Procedures (including critical care time)  Medications Ordered in ED Medications  sodium chloride 0.9 % bolus 1,000 mL (0 mLs Intravenous Stopped 10/15/16 1918)  gi cocktail (Maalox,Lidocaine,Donnatal) (30 mLs Oral Given 10/15/16 2038)     Initial Impression / Assessment and Plan / ED Course  I have reviewed the triage vital signs and the nursing notes.  Pertinent labs & imaging results that were available during my care of the patient were reviewed by me and considered in my medical decision making (see chart for details).    [age]yo male with history of htn, hlpd, hernia repair surgery presents with concern for epigastric abdominal pain and diarrhea.  Given epigastric pain and lower chest pain, EKG and delta troponin completed and without acute findings. Doubt ACS. Low suspicion for other cardiac etiology of pain in setting of diarrhea, other more likely etiologies.  Normal lipase, normal transaminases.  Abdominal exam benign, no tenderness, doubt cholecystitis/appendicitis.  No emesis, and pt with diarrhea, doubt obstruction. Urine likely with contamination, low suspicion for UTI by hx.  Suspect epigastric pain caused by gastritis, possible PUD in setting of NSAID use. Pain improved with GI cocktail.   No recent abx use to suggest CDiff as cause of diarrhea.  Suspect viral gastroenteritis/gastritis. Pt taking protonix 40mg  BID now, recommend continuing.  Mild AKI on labs, given IVF in ED. Recommend outpatient check of Cr, avoidance of NSAIDs. Gave rx for  zofran, recommend continued hydration, supportive care, close PCP follow up. Patient discharged in stable condition with understanding of reasons to return.   Final Clinical Impressions(s) / ED Diagnoses   Final diagnoses:  Epigastric abdominal pain  Diarrhea of presumed infectious origin  Gastroenteritis    New Prescriptions Discharge Medication List as of 10/15/2016 10:49 PM    START taking these medications   Details  ondansetron (ZOFRAN ODT) 4 MG disintegrating tablet Take 1 tablet (4 mg total) by mouth every 8 (eight) hours as needed for nausea or vomiting., Starting Sat 10/15/2016, Print      I personally performed the services described in this documentation, which was scribed in my presence. The recorded information has been reviewed and is accurate.     Alvira MondayErin Aries Kasa, MD 10/16/16 1229

## 2016-10-15 NOTE — ED Notes (Signed)
Pt unable to urinate at this time, states he will wait until bolus of fluids is complete.

## 2016-10-15 NOTE — ED Notes (Addendum)
Pt requesting to take the GI cocktail prior to the nitroglycerin. States he feels like this is a "stomach" issue. MD aware.

## 2016-10-15 NOTE — ED Triage Notes (Signed)
Diarrhea since yesterday and epigastric pain today.

## 2016-10-15 NOTE — ED Notes (Signed)
Pt made aware to return if symptoms worsen or if any life threatening symptoms occur.   

## 2016-10-17 ENCOUNTER — Ambulatory Visit: Payer: Medicare Other | Admitting: Physical Therapy

## 2016-10-20 ENCOUNTER — Ambulatory Visit: Payer: Medicare Other | Attending: Orthopedic Surgery | Admitting: Physical Therapy

## 2016-10-20 DIAGNOSIS — M6281 Muscle weakness (generalized): Secondary | ICD-10-CM | POA: Diagnosis present

## 2016-10-20 DIAGNOSIS — M25551 Pain in right hip: Secondary | ICD-10-CM | POA: Diagnosis not present

## 2016-10-20 NOTE — Therapy (Signed)
[facility] [address]  [place], [place], [address] Phone: [phone]   Fax:  [phone]  Physical Therapy Treatment  Patient Details  Name: Jon Bush MRN: [id number] Date of Birth: [birth year] Referring Provider: Judith Part. [place], [place]  Encounter Date: 10/20/2016      PT End of Session - 10/20/16 0847    Visit Number 14   Number of Visits 20   Date for PT Re-Evaluation 11/09/16   PT Start Time 0843   PT Stop Time 0940  moist heat   PT Time Calculation (min) 57 min   Activity Tolerance Patient tolerated treatment well   Behavior During Therapy [facility] for tasks assessed/performed      Past Medical History:  Diagnosis Date  . Amaurosis fugax of left eye   . Anemia   . BPH (benign prostatic hyperplasia)   . Central retinal vein occlusion of left eye    (Left) Dr. Fenton Foy [facility]); Dr. Newell Coral [facility])   . Enlarged prostate   . H. pylori infection 02/14/2013  . High cholesterol   . Hypertension   . IBS (irritable bowel syndrome)   . Kidney stone   . Kidney stones   . Macular hole of left eye 2005  . Migraine headache   . Retinal detachment 2005  . Sleep apnea    cpap    Past Surgical History:  Procedure Laterality Date  . HERNIA REPAIR    . KIDNEY STONE SURGERY    . LITHOTRIPSY    . RETINAL DETACHMENT SURGERY    . REVERSE SHOULDER ARTHROPLASTY Right 02/18/2016  . REVERSE SHOULDER ARTHROPLASTY Right 02/18/2016   Procedure: RIGHT REVERSE SHOULDER ARTHROPLASTY;  Surgeon: Justice Britain, MD;  Location: [place];  Service: Orthopedics;  Laterality: Right;    There were no vitals filed for this visit.      Subjective Assessment - 10/20/16 0845    Subjective patient reports trip to ED due to stomach bug - feels like he has been taking too much ibuprofen. Thinks hes going to get a "shot" in his hip tomorrow.    Patient Stated Goals Reduce pain   Currently in Pain? Yes   Pain Score 5     Pain Location Buttocks   Pain Orientation Right   Pain Descriptors / Indicators Dull;Aching                         [facility] Adult PT Treatment/Exercise - 10/20/16 0848      Knee/Hip Exercises: Stretches   Passive Hamstring Stretch Right;3 reps;30 seconds   Passive Hamstring Stretch Limitations seated with R LE extended; beginning and end of session   Piriformis Stretch Right;3 reps;30 seconds   Piriformis Stretch Limitations seated figure 4 with trunk flexion; beginning and end of session     Knee/Hip Exercises: Aerobic   Nustep level 6 x 6 minutes     Knee/Hip Exercises: Standing   Hip Extension Stengthening;Right;2 sets;15 reps;Knee straight   Extension Limitations 3# - 45 degree kickback   Other Standing Knee Exercises glute med hip drops on 4" step - 2 x 15 each side     Knee/Hip Exercises: Seated   Sit to Sand 15 reps;without UE support;2 sets  from low mat table - 6#     Knee/Hip Exercises: Supine   Single Leg Bridge Strengthening;Right;2 sets;15 reps  5 sec hold     Knee/Hip Exercises: Prone   Hamstring Curl  1 set;15 reps   Hamstring Curl Limitations R LE - 3#   Hip Extension Strengthening;Right;15 reps;2 sets   Hip Extension Limitations quadruped; progression to fire hydrants R LE 2 x 15     Moist Heat Therapy   Number Minutes Moist Heat 10 Minutes   Moist Heat Location Hip                  PT Short Term Goals - 04/07/16 1247      PT SHORT TERM GOAL #1   Title improve PROM to WNL for improved motion (04/11/16)   Baseline achieved within protocol limitations   Status Achieved     PT SHORT TERM GOAL #2   Title independent with initial HEP (04/11/16)   Status Achieved           PT Long Term Goals - 10/11/16 0254      PT LONG TERM GOAL #1   Title Patient to be independent with advanced HEP (10/12/16)   Status Partially Met  2.20.18: met for current     PT LONG TERM GOAL #2   Title Patient to improve R LE flexibility of HS,  IT band and piriformis with good compliance with stretching independently (10/12/16)   Status Partially Met  2.20.18 Pt. reporting good compliance with R LE HS, IT band, and piriformis stretching activities at home      PT LONG TERM GOAL #3   Title Patient to report ability to return to household work with no increase in pain (10/12/16)   Status Partially Met  2.20.18; Pt. reporting he is pain free with household work with exception of bending down quickly to pick up something off floor.       PT LONG TERM GOAL #4   Title Patient to improve B LE strength to >/= 4+/5 with no increase in pain (10/12/16)   Status Partially Met  2.20.18: Pt. able to meet for all with exception of R hip flexion still 4/5 strength      PT LONG TERM GOAL #5   Title patient to demonstrate supine to sit with pain no greater than 1/10 for greater than 2 weeks (10/12/16)   Status Achieved  2.20.18: Pt. reporting pain free with this movement at home                Plan - 10/20/16 0847    Clinical Impression Statement Patient doing well today. PT session focusing heavily on glute strengthening with patient responding well - and with appropriate muscle soreness/fatigue. Patient to continue to benefit from PT to maximize function and mobility with reduced pain.    PT Treatment/Interventions ADLs/Self Care Home Management;Cryotherapy;Electrical Stimulation;Moist Heat;Ultrasound;Neuromuscular re-education;Balance training;Therapeutic exercise;Therapeutic activities;Functional mobility training;Stair training;Gait training;Patient/family education;Manual techniques;Passive range of motion;Vasopneumatic Device;Taping;Dry needling   PT Next Visit Plan general hip/buttock strengthening   Consulted and Agree with Plan of Care Patient      Patient will benefit from skilled therapeutic intervention in order to improve the following deficits and impairments:  Decreased activity tolerance, Decreased range of motion, Decreased  strength, Increased muscle spasms, Pain  Visit Diagnosis: Pain in right hip  Muscle weakness (generalized)     Problem List Patient Active Problem List   Diagnosis Date Noted  . 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 Impingement syndrome of right shoulder 08/07/2014  . Pain in the chest 02/09/2014  . Shortness of breath 02/09/2014  . Gastroesophageal reflux disease without esophagitis 02/09/2014  . Other and unspecified hyperlipidemia 09/08/2013  .  Hereditary and idiopathic peripheral neuropathy 07/22/2013  . Allergic rhinitis, cause unspecified 07/14/2013  . Pain in joint, pelvic region and thigh 07/14/2013  . Elevated blood pressure 05/11/2013  . Insomnia 05/11/2013  . BPH (benign prostatic hypertrophy) 05/11/2013  . Helicobacter pylori (H. pylori) 02/14/2013  . Irritable bowel syndrome 02/14/2013  . Encounter for therapeutic drug monitoring 02/09/2013  . GERD (gastroesophageal reflux disease) 02/09/2013     Lanney Gins, PT, DPT 10/20/16 10:00 AM  [facility] [address]  [place], [place], [address] Phone: [phone]   Fax:  [phone]  Name: Jon Bush MRN: [id number] Date of Birth: 21-Aug-1946

## 2016-10-20 NOTE — Patient Instructions (Signed)
Bridging (Single Leg)    Lie on back with feet shoulder width apart and right leg straight. Lift hips toward the ceiling while keeping leg straight. Hold __5__ seconds. Repeat _15___ times.    Quadruped: Hip Extension / Knee Extension (Active)    On hands and knees, lift right leg with knee straight.  Complete _2__ sets of __15_ repetitions.    Quadruped: Hip Abduction / Knee Flexion (Active)    On hands and knees, lift right bent knee out to the side.  Complete _2__ sets of __15_ repetitions.

## 2016-10-25 ENCOUNTER — Ambulatory Visit: Payer: Medicare Other

## 2016-10-31 ENCOUNTER — Ambulatory Visit: Payer: Medicare Other | Admitting: Physical Therapy

## 2016-11-03 ENCOUNTER — Ambulatory Visit: Payer: Medicare Other

## 2016-11-03 DIAGNOSIS — M6281 Muscle weakness (generalized): Secondary | ICD-10-CM

## 2016-11-03 DIAGNOSIS — M25551 Pain in right hip: Secondary | ICD-10-CM | POA: Diagnosis not present

## 2016-11-03 NOTE — Therapy (Signed)
[facility] [address]  [place], [place], [address] Phone: [phone]   Fax:  [phone]  Physical Therapy Treatment  Patient Details  Name: Jon Bush MRN: [id number] Date of Birth: [birth year] Referring Provider: Judith Part. [place], [place]  Encounter Date: 11/03/2016      PT End of Session - 11/03/16 0854    Visit Number 15   Number of Visits 20   Date for PT Re-Evaluation 11/09/16   PT Start Time 0845   PT Stop Time 0936  10 min moist heat to end treatment   PT Time Calculation (min) 51 min   Activity Tolerance Patient tolerated treatment well   Behavior During Therapy [employer] [facility] for tasks assessed/performed      Past Medical History:  Diagnosis Date  . Amaurosis fugax of left eye   . Anemia   . BPH (benign prostatic hyperplasia)   . Central retinal vein occlusion of left eye    (Left) Dr. Fenton Foy [facility]); Dr. Newell [facility])   . Enlarged prostate   . H. pylori infection 02/14/2013  . High cholesterol   . Hypertension   . IBS (irritable bowel syndrome)   . Kidney stone   . Kidney stones   . Macular hole of left eye 2005  . Migraine headache   . Retinal detachment 2005  . Sleep apnea    cpap    Past Surgical History:  Procedure Laterality Date  . HERNIA REPAIR    . KIDNEY STONE SURGERY    . LITHOTRIPSY    . RETINAL DETACHMENT SURGERY    . REVERSE SHOULDER ARTHROPLASTY Right 02/18/2016  . REVERSE SHOULDER ARTHROPLASTY Right 02/18/2016   Procedure: RIGHT REVERSE SHOULDER ARTHROPLASTY;  Surgeon: Justice Britain, MD;  Location: [place];  Service: Orthopedics;  Laterality: Right;    There were no vitals filed for this visit.      Subjective Assessment - 11/03/16 0848    Subjective Pt. reporting R buttocks pain was worse this morning however initially today in therapy with a low level ache.  Pt. reporting he is just getting back into regularly performing HEP activities and gym.  Pt.  reporting he is now pain free while using the bathroom and rising from bed.  Pt. with less pain bending to pick objects up off floor now.       Patient Stated Goals Reduce pain   Currently in Pain? Yes   Pain Score 2    Pain Location Buttocks   Pain Orientation Right   Pain Descriptors / Indicators Dull;Aching   Pain Type Acute pain   Pain Radiating Towards n/a   Pain Onset More than a month ago   Pain Frequency Intermittent   Multiple Pain Sites No                         [facility] Adult PT Treatment/Exercise - 11/03/16 0901      Knee/Hip Exercises: Stretches   Passive Hamstring Stretch Right;30 seconds;2 reps   Passive Hamstring Stretch Limitations Supine with strap   Piriformis Stretch Right;3 reps;30 seconds   Piriformis Stretch Limitations seated figure 4 with trunk flexion; beginning and end of session  following sets of sit<>stand and to end session     Knee/Hip Exercises: Aerobic   Nustep level 6 x 6 minutes     Knee/Hip Exercises: Standing   Forward Lunges Right;1 set;15 reps   Forward Lunges Limitations 1 hand support  on counter; R leg back    Hip Extension Stengthening;Right;2 sets;15 reps;Knee straight   Extension Limitations 3# - 45 degree kickback     Knee/Hip Exercises: Seated   Sit to Sand 15 reps;without UE support;3 sets  blue foam oval under L LE; mild R buttocks pain following     Knee/Hip Exercises: Supine   Single Leg Bridge Strengthening;Right;2 sets;15 reps     Knee/Hip Exercises: Prone   Hip Extension Strengthening;Right;15 reps;2 sets   Hip Extension Limitations quadruped; progression to fire hydrants R LE 2 x 15     Moist Heat Therapy   Number Minutes Moist Heat 10 Minutes   Moist Heat Location Hip                  PT Short Term Goals - 04/07/16 1247      PT SHORT TERM GOAL #1   Title improve PROM to WNL for improved motion (04/11/16)   Baseline achieved within protocol limitations   Status Achieved     PT SHORT  TERM GOAL #2   Title independent with initial HEP (04/11/16)   Status Achieved           PT Long Term Goals - 10/11/16 4315      PT LONG TERM GOAL #1   Title Patient to be independent with advanced HEP (10/12/16)   Status Partially Met  2.20.18: met for current     PT LONG TERM GOAL #2   Title Patient to improve R LE flexibility of HS, IT band and piriformis with good compliance with stretching independently (10/12/16)   Status Partially Met  2.20.18 Pt. reporting good compliance with R LE HS, IT band, and piriformis stretching activities at home      PT LONG TERM GOAL #3   Title Patient to report ability to return to household work with no increase in pain (10/12/16)   Status Partially Met  2.20.18; Pt. reporting he is pain free with household work with exception of bending down quickly to pick up something off floor.       PT LONG TERM GOAL #4   Title Patient to improve B LE strength to >/= 4+/5 with no increase in pain (10/12/16)   Status Partially Met  2.20.18: Pt. able to meet for all with exception of R hip flexion still 4/5 strength      PT LONG TERM GOAL #5   Title patient to demonstrate supine to sit with pain no greater than 1/10 for greater than 2 weeks (10/12/16)   Status Achieved  2.20.18: Pt. reporting pain free with this movement at home                Plan - 11/03/16 4008    Clinical Impression Statement Pt. returning following 2 week absence from therapy due to illness.  Pt. reporting much improved pain level with daily activities now.  Only feeling discomfort with bending, squatting, and using bathroom now, "no real pain".  Focus on today's visit was glute muscle strengthening activities.  Pt. tolerating all activities well today only with report of, "discomfort" in R buttocks following functional strengthening.  R HS, piriformis stretching performed after reports of discomfort with complete relief following.  Pt. reports continued use of LE stretching and  ball massage to manage R buttocks pain at home.  Pt. with plans to return to gym workout and consistent HEP performance.  Some continued R buttocks pain this morning without known trigger and pt. unable to  identify trigger for, "discomfort" in daily routine today.  Pt. may be open to possibility of transition to home program however, this was not discussed.  Pt. with one more visit before upcoming re-cert date 4.00.18.     PT Treatment/Interventions ADLs/Self Care Home Management;Cryotherapy;Electrical Stimulation;Moist Heat;Ultrasound;Neuromuscular re-education;Balance training;Therapeutic exercise;Therapeutic activities;Functional mobility training;Stair training;Gait training;Patient/family education;Manual techniques;Passive range of motion;Vasopneumatic Device;Taping;Dry needling   PT Next Visit Plan general hip/buttock strengthening      Patient will benefit from skilled therapeutic intervention in order to improve the following deficits and impairments:  Decreased activity tolerance, Decreased range of motion, Decreased strength, Increased muscle spasms, Pain  Visit Diagnosis: Pain in right hip  Muscle weakness (generalized)     Problem List Patient Active Problem List   Diagnosis Date Noted  . 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 Impingement syndrome of right shoulder 08/07/2014  . Pain in the chest 02/09/2014  . Shortness of breath 02/09/2014  . Gastroesophageal reflux disease without esophagitis 02/09/2014  . Other and unspecified hyperlipidemia 09/08/2013  . Hereditary and idiopathic peripheral neuropathy 07/22/2013  . Allergic rhinitis, cause unspecified 07/14/2013  . Pain in joint, pelvic region and thigh 07/14/2013  . Elevated blood pressure 05/11/2013  . Insomnia 05/11/2013  . BPH (benign prostatic hypertrophy) 05/11/2013  . Helicobacter pylori (H. pylori) 02/14/2013  . Irritable bowel syndrome 02/14/2013  . Encounter for therapeutic drug monitoring  02/09/2013  . GERD (gastroesophageal reflux disease) 02/09/2013    Bess Harvest, PTA 11/03/16 12:08 PM  [facility] [place] [address]  [place], [place], [address] Phone: [phone]   Fax:  [phone]  Name: Jon Bush MRN: [id number] Date of Birth: 1945/11/05

## 2016-11-08 ENCOUNTER — Ambulatory Visit: Payer: Medicare Other | Admitting: Physical Therapy

## 2016-11-08 DIAGNOSIS — M25551 Pain in right hip: Secondary | ICD-10-CM | POA: Diagnosis not present

## 2016-11-08 DIAGNOSIS — M6281 Muscle weakness (generalized): Secondary | ICD-10-CM

## 2016-11-08 NOTE — Therapy (Signed)
[facility] [place] [address]  [address] [place], [place], [address] Phone: [phone]   Fax:  [phone]  Physical Therapy Treatment  Patient Details  Name: Jon Bush MRN: [id number] Date of Birth: Dec 09, 1945 Referring Provider: Jaquelyn BitterStephen J. Chabon  Encounter Date: 11/08/2016      PT End of Session - 11/08/16 0846    Visit Number 16   Number of Visits 24   Date for PT Re-Evaluation 12/06/16   PT Start Time 0842   PT Stop Time 0925   PT Time Calculation (min) 43 min   Activity Tolerance Patient tolerated treatment well   Behavior [facility] Therapy [facility] for tasks assessed/performed      Past Medical History:  Diagnosis Date  . Amaurosis fugax of left eye   . Anemia   . BPH (benign prostatic hyperplasia)   . Central retinal vein occlusion of left eye    (Left) [facility] Guy Beginobert [facility]. Memory Argueraig Gre[facility]   . Enlarged prostate   . H. pylori infection 02/14/2013  . High cholesterol   . Hypertension   . IBS (irritable bowel syndrome)   . Kidney stone   . Kidney stones   . Macular hole of left eye 2005  . Migraine headache   . Retinal detachment 2005  . Sleep apnea    cpap    Past Surgical History:  Procedure Laterality Date  . HERNIA REPAIR    . KIDNEY STONE SURGERY    . LITHOTRIPSY    . RETINAL DETACHMENT SURGERY    . REVERSE SHOULDER ARTHROPLASTY Right 02/18/2016  . REVERSE SHOULDER ARTHROPLASTY Right 02/18/2016   Procedure: RIGHT REVERSE SHOULDER ARTHROPLASTY;  Surgeon: Francena HanlyKevin Supple, MD;  [facility];  Service: Orthopedics;  Laterality: Right;    There were no vitals filed for this visit.      Subjective Assessment - 11/08/16 0843    Subjective Did have an increase in buttock pain yesterday - some radiation into lower leg.    Patient Stated Goals Reduce pain   Currently in Pain? Yes   Pain Score 3    Pain Location Buttocks   Pain Orientation Right   Pain Descriptors / Indicators  Aching            [facility] PT Assessment - 11/08/16 0001      Assessment   Medical Diagnosis Spondylolisthesis of lumbar region, DDD, buttock pain    Referring Provider Jaquelyn BitterStephen J. Chabon     Strength   Right Hip Flexion 4+/5   Right Hip Extension 4+/5   Right Hip ABduction 4+/5   Left Hip Flexion 4+/5   Left Hip Extension 4+/5   Left Hip ABduction 4+/5   Right Knee Flexion 4+/5   Right Knee Extension 5/5   Left Knee Flexion 4+/5   Left Knee Extension 5/5                     [facility] Adult PT Treatment/Exercise - 11/08/16 0902      Knee/Hip Exercises: Stretches   Passive Hamstring Stretch Right;3 reps;30 seconds   Passive Hamstring Stretch Limitations seated with R LE extended   Piriformis Stretch Right;3 reps;30 seconds   Piriformis Stretch Limitations seated figure 4 position     Knee/Hip Exercises: Aerobic   Nustep level 6 x 6 minutes     Knee/Hip Exercises: Standing   Hip Extension Stengthening;Right;15 reps;Knee straight   Extension Limitations 4# - 45 degree kickback   Other  Standing Knee Exercises glute med hip drops - B sides x 15 reps (on 4" step)     Knee/Hip Exercises: Seated   Sit to Sand 10 reps;without UE support  to low box with 1 AirEx     Manual Therapy   Manual Therapy Joint mobilization;Soft tissue mobilization;Myofascial release;Taping   Joint Mobilization Very gentle (grade I-II) CPA of lumbar and lower thoracic spine - good joint mobility - no pain    Soft tissue mobilization STM to glute complex, piriformis and hamstring origination - some spasming with deep pressure   Myofascial Release manual trigger point release of R piriformis/glut complex   [facility] doctor R piriformis/glut region - 2 I strips overlapping                  PT Short Term Goals - 04/07/16 1247      PT SHORT TERM GOAL #1   Title improve PROM to WNL for improved motion (04/11/16)   Baseline achieved within  protocol limitations   Status Achieved     PT SHORT TERM GOAL #2   Title independent with initial HEP (04/11/16)   Status Achieved           PT Long Term Goals - 11/08/16 0929      PT LONG TERM GOAL #1   Title Patient to be independent with advanced HEP (12/06/16)   Status On-going     PT LONG TERM GOAL #2   Title Patient to improve R LE flexibility of HS, IT band and piriformis with good compliance with stretching independently (12/06/16)   Status On-going     PT LONG TERM GOAL #3   Title Patient to report ability to return to household work with no increase in pain (12/06/16)   Status On-going     PT LONG TERM GOAL #4   Title Patient to improve B LE strength to >/= 4+/5 with no increase in pain (12/06/16)   Status Achieved     PT LONG TERM GOAL #5   Title patient to demonstrate supine to sit with pain no greater than 1/10 for greater than 2 weeks (10/12/16)   Status Achieved               Plan - 11/08/16 0847    Clinical Impression Statement Mr. Pumphrey today with continued butock pain. Gentle joint mobs of spine with good mobility and no pain reproduction into buttock. Patient tender to deep palpation of glute ocmplex/piriformis with some spasming of buttock mm with manual trigger point release. Continued hip strengthening today with pateint reporting pain in R buttock following ther ex. Taping to R glut/pirifomis today to see if beneficial as patient has tried other modalities and pain relieving techniques with little success long term. Will extend patients plan of care for a few weeks to assess response to taping and to prepare patient for independent HEP practice.    PT Treatment/Interventions ADLs/Self Care Home Management;Cryotherapy;Electrical Stimulation;Moist Heat;Ultrasound;Neuromuscular re-education;Balance training;Therapeutic exercise;Therapeutic activities;Functional mobility training;Stair training;Gait training;Patient/family education;Manual  techniques;Passive range of motion;Vasopneumatic Device;Taping;Dry needling   PT Next Visit Plan general hip/buttock strengthening; assess response to taping   Consulted and Agree with Plan of Care Patient      Patient will benefit from skilled therapeutic intervention in order to improve the following deficits and impairments:  Decreased activity tolerance, Decreased range of motion, Decreased strength, Increased muscle spasms, Pain  Visit Diagnosis: Pain in right hip - Plan:  PT plan of care cert/re-cert  Muscle weakness (generalized) - Plan: PT plan of care cert/re-cert     Problem List Patient Active Problem List   Diagnosis Date Noted  . 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 Impingement syndrome of right shoulder 08/07/2014  . Pain in the chest 02/09/2014  . Shortness of breath 02/09/2014  . Gastroesophageal reflux disease without esophagitis 02/09/2014  . Other and unspecified hyperlipidemia 09/08/2013  . Hereditary and idiopathic peripheral neuropathy 07/22/2013  . Allergic rhinitis, cause unspecified 07/14/2013  . Pain in joint, pelvic region and thigh 07/14/2013  . Elevated blood pressure 05/11/2013  . Insomnia 05/11/2013  . BPH (benign prostatic hypertrophy) 05/11/2013  . Helicobacter pylori (H. pylori) 02/14/2013  . Irritable bowel syndrome 02/14/2013  . Encounter for therapeutic drug monitoring 02/09/2013  . GERD (gastroesophageal reflux disease) 02/09/2013     Kipp Laurence, PT, DPT 11/08/16 10:40 AM   [facility] [address]  [address] [place], [place], [address] Phone: [phone]   Fax:  [phone]  Name: Jon Bush MRN: [id number] Date of Birth: Apr 27, 1946

## 2016-11-11 ENCOUNTER — Ambulatory Visit: Payer: Medicare Other

## 2016-11-11 DIAGNOSIS — M6281 Muscle weakness (generalized): Secondary | ICD-10-CM

## 2016-11-11 DIAGNOSIS — M25551 Pain in right hip: Secondary | ICD-10-CM

## 2016-11-11 NOTE — Therapy (Signed)
[facility] [address]  [address] [place], [place], [address] Phone: [phone]   Fax:  [phone]  Physical Therapy Treatment  Patient Details  Name: Jon Bush MRN: [id number] Date of Birth: September 07, 1945 Referring Provider: Jaquelyn BitterStephen J. Chabon  Encounter Date: 11/11/2016      PT End of Session - 11/11/16 0853    Visit Number 17   Number of Visits 24   Date for PT Re-Evaluation 12/06/16   PT Start Time 0845   PT Stop Time 0928   PT Time Calculation (min) 43 min   Activity Tolerance Patient tolerated treatment well   Behavior During Therapy [facility] for tasks assessed/performed      Past Medical History:  Diagnosis Date  . Amaurosis fugax of left eye   . Anemia   . BPH (benign prostatic hyperplasia)   . Central retinal vein occlusion of left eye    (Left) Dr. Guy Beginobert DeVanzo [facility] Dr. Memory Argueraig Grevin [facility]   . Enlarged prostate   . H. pylori infection 02/14/2013  . High cholesterol   . Hypertension   . IBS (irritable bowel syndrome)   . Kidney stone   . Kidney stones   . Macular hole of left eye 2005  . Migraine headache   . Retinal detachment 2005  . Sleep apnea    cpap    Past Surgical History:  Procedure Laterality Date  . HERNIA REPAIR    . KIDNEY STONE SURGERY    . LITHOTRIPSY    . RETINAL DETACHMENT SURGERY    . REVERSE SHOULDER ARTHROPLASTY Right 02/18/2016  . REVERSE SHOULDER ARTHROPLASTY Right 02/18/2016   Procedure: RIGHT REVERSE SHOULDER ARTHROPLASTY;  Surgeon: Francena HanlyKevin Supple, MD;  [facility];  Service: Orthopedics;  Laterality: Right;    There were no vitals filed for this visit.      Subjective Assessment - 11/11/16 0849    Subjective Pt. reporting he feels taping has helped decrease pain today and yesterday.  Pt. still performing LE stretches daily and ball massage as needed to buttocks pain at home.  Taping still intact today however coming loose.  Less pain with bending now.      Patient Stated Goals Reduce pain   Currently in Pain? Yes   Pain Score 3    Pain Location Buttocks   Pain Orientation Right   Pain Descriptors / Indicators Aching   Pain Type Acute pain   Pain Radiating Towards n/a   Pain Onset More than a month ago   Pain Frequency Intermittent   Aggravating Factors  some increase with bending   Pain Relieving Factors over counter pain meds    Multiple Pain Sites No                         [facility] Adult PT Treatment/Exercise - 11/11/16 0909      Knee/Hip Exercises: Stretches   Passive Hamstring Stretch Right;3 reps;30 seconds   Passive Hamstring Stretch Limitations first 2 sets supine; seated with R LE extended   Piriformis Stretch Right;3 reps;30 seconds   Piriformis Stretch Limitations first 2 sets supine; seated figure 4 position     Knee/Hip Exercises: Aerobic   Nustep level 6 x 6 minutes     Knee/Hip Exercises: Standing   Forward Lunges Right;1 set;15 reps   Forward Lunges Limitations bilaterally; cues to "clinch buttocks together"    Hip Extension Stengthening;Right;15 reps;Knee straight   Extension Limitations 4# - 45  degree kickback   Other Standing Knee Exercises glute med hip drops - B sides x 15 reps (on 4" step)     Knee/Hip Exercises: Seated   Sit to Sand 10 reps;without UE support;2 sets     [employer]   Create Space R piriformis/glut region - 2 I strips overlapping                  PT Short Term Goals - 04/07/16 1247      PT SHORT TERM GOAL #1   Title improve PROM to WNL for improved motion (04/11/16)   Baseline achieved within protocol limitations   Status Achieved     PT SHORT TERM GOAL #2   Title independent with initial HEP (04/11/16)   Status Achieved           PT Long Term Goals - 11/08/16 0929      PT LONG TERM GOAL #1   Title Patient to be independent with advanced HEP (12/06/16)   Status On-going     PT LONG TERM GOAL #2   Title Patient to improve R LE flexibility of  HS, IT band and piriformis with good compliance with stretching independently (12/06/16)   Status On-going     PT LONG TERM GOAL #3   Title Patient to report ability to return to household work with no increase in pain (12/06/16)   Status On-going     PT LONG TERM GOAL #4   Title Patient to improve B LE strength to >/= 4+/5 with no increase in pain (12/06/16)   Status Achieved     PT LONG TERM GOAL #5   Title patient to demonstrate supine to sit with pain no greater than 1/10 for greater than 2 weeks (10/12/16)   Status Achieved               Plan - 11/11/16 0907    Clinical Impression Statement Pt. reporting taping to R buttocks provided some relief since last treatment with pt. reporting pain free with bending now.  Taping coming loose today thus reapplied.  Treatment with hip strengthening activity focusing on glute strengthening through lunge and sit<>stand activities.  Pt. pain free with therex today following taping to begin treatment.  Will monitor response to continued taping in coming visits.     PT Treatment/Interventions ADLs/Self Care Home Management;Cryotherapy;Electrical Stimulation;Moist Heat;Ultrasound;Neuromuscular re-education;Balance training;Therapeutic exercise;Therapeutic activities;Functional mobility training;Stair training;Gait training;Patient/family education;Manual techniques;Passive range of motion;Vasopneumatic Device;Taping;Dry needling   PT Next Visit Plan general hip/buttock strengthening; assess response to taping      Patient will benefit from skilled therapeutic intervention in order to improve the following deficits and impairments:  Decreased activity tolerance, Decreased range of motion, Decreased strength, Increased muscle spasms, Pain  Visit Diagnosis: Pain in right hip  Muscle weakness (generalized)     Problem List Patient Active Problem List   Diagnosis Date Noted  . 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 Impingement  syndrome of right shoulder 08/07/2014  . Pain in the chest 02/09/2014  . Shortness of breath 02/09/2014  . Gastroesophageal reflux disease without esophagitis 02/09/2014  . Other and unspecified hyperlipidemia 09/08/2013  . Hereditary and idiopathic peripheral neuropathy 07/22/2013  . Allergic rhinitis, cause unspecified 07/14/2013  . Pain in joint, pelvic region and thigh 07/14/2013  . Elevated blood pressure 05/11/2013  . Insomnia 05/11/2013  . BPH (benign prostatic hypertrophy) 05/11/2013  . Helicobacter pylori (H. pylori) 02/14/2013  . Irritable bowel syndrome 02/14/2013  . Encounter for therapeutic  drug monitoring 02/09/2013  . GERD (gastroesophageal reflux disease) 02/09/2013    Kermit Balo, PTA 11/11/16 12:39 PM  [facility] [address]  [address] [place], [place], [address] Phone: [phone]   Fax:  [phone]  Name: Jon Bush MRN: [id number] Date of Birth: [birth year]

## 2016-11-14 ENCOUNTER — Ambulatory Visit: Payer: Medicare Other

## 2016-11-14 DIAGNOSIS — M25551 Pain in right hip: Secondary | ICD-10-CM

## 2016-11-14 DIAGNOSIS — M6281 Muscle weakness (generalized): Secondary | ICD-10-CM

## 2016-11-14 NOTE — Therapy (Signed)
[facility] [address]  [address] [place], [place], [address] Phone: [phone]   Fax:  [phone]  Physical Therapy Treatment  Patient Details  Name: Jon Bush MRN: [id number] Date of Birth: 29-Feb-1946 Referring Provider: Jaquelyn Bitter. Chabon  Encounter Date: 11/14/2016      PT End of Session - 11/14/16 1313    Visit Number 18   Number of Visits 24   Date for PT Re-Evaluation 12/06/16   PT Start Time 1313   PT Stop Time 1400   PT Time Calculation (min) 47 min   Activity Tolerance Patient tolerated treatment well   Behavior During Therapy WFL for tasks assessed/performed      Past Medical History:  Diagnosis Date  . Amaurosis fugax of left eye   . Anemia   . BPH (benign prostatic hyperplasia)   . Central retinal vein occlusion of left eye    (Left) Dr. Guy Begin [facility]); Dr. Memory Argue [facility])   . Enlarged prostate   . H. pylori infection 02/14/2013  . High cholesterol   . Hypertension   . IBS (irritable bowel syndrome)   . Kidney stone   . Kidney stones   . Macular hole of left eye 2005  . Migraine headache   . Retinal detachment 2005  . Sleep apnea    cpap    Past Surgical History:  Procedure Laterality Date  . HERNIA REPAIR    . KIDNEY STONE SURGERY    . LITHOTRIPSY    . RETINAL DETACHMENT SURGERY    . REVERSE SHOULDER ARTHROPLASTY Right 02/18/2016  . REVERSE SHOULDER ARTHROPLASTY Right 02/18/2016   Procedure: RIGHT REVERSE SHOULDER ARTHROPLASTY;  Surgeon: Francena Hanly, MD;  Location: [facility];  Service: Orthopedics;  Laterality: Right;    There were no vitals filed for this visit.      Subjective Assessment - 11/14/16 1315    Subjective Pt. noting limited benefit from taping as compared to last treatment.  Pt. able to sleep through the night without pain last night.  Pt. still noting pain is improving.     Patient Stated Goals Reduce pain   Currently in Pain? Yes   Pain Score 2     Pain Location Buttocks   Pain Orientation Right   Pain Descriptors / Indicators Aching   Pain Type Acute pain   Pain Onset More than a month ago   Pain Frequency Intermittent   Multiple Pain Sites No                         [facility] Adult PT Treatment/Exercise - 11/14/16 1322      Knee/Hip Exercises: Stretches   Piriformis Stretch Right;30 seconds;2 reps   Piriformis Stretch Limitations 2nd set in seated position     Knee/Hip Exercises: Aerobic   Stationary Bike Level 3, 7 min      Knee/Hip Exercises: Machines for Strengthening   Total Gym Leg Press 35# x 15 reps      Knee/Hip Exercises: Standing   Hip Extension Stengthening;Right;15 reps;Knee straight   Extension Limitations 4# - 45 degree kickback   Forward Step Up Right;1 set;15 reps;Step Height: 6";Hand Hold: 0;Left   Forward Step Up Limitations Focusing on R glute activation     Knee/Hip Exercises: Seated   Sit to Sand without UE support;2 sets;15 reps  from low box with airex pad      [employer]   Create Space R piriformis/glut region -  2 I strips overlapping                  PT Short Term Goals - 04/07/16 1247      PT SHORT TERM GOAL #1   Title improve PROM to WNL for improved motion (04/11/16)   Baseline achieved within protocol limitations   Status Achieved     PT SHORT TERM GOAL #2   Title independent with initial HEP (04/11/16)   Status Achieved           PT Long Term Goals - 11/08/16 0929      PT LONG TERM GOAL #1   Title Patient to be independent with advanced HEP (12/06/16)   Status On-going     PT LONG TERM GOAL #2   Title Patient to improve R LE flexibility of HS, IT band and piriformis with good compliance with stretching independently (12/06/16)   Status On-going     PT LONG TERM GOAL #3   Title Patient to report ability to return to household work with no increase in pain (12/06/16)   Status On-going     PT LONG TERM GOAL #4   Title Patient to improve B LE  strength to >/= 4+/5 with no increase in pain (12/06/16)   Status Achieved     PT LONG TERM GOAL #5   Title patient to demonstrate supine to sit with pain no greater than 1/10 for greater than 2 weeks (10/12/16)   Status Achieved               Plan - 11/14/16 1320    Clinical Impression Statement Pt. reporting somewhat less benefit from taping as compared to last treatment however wishes to try reapplication today.  General hip strengthening with focus on buttocks strengthening activity continued today with mild progression.  Pt. with better tolerance today without onset of buttocks pain with therex.  Taping reapplied to R buttocks today with pt. verbalizing benefit to end treatment.  Following discussion with PT and pt. will plan to d/c next visit.  Pt. reaching full potential with therapy and will be appropriate to transfer to home/gym program.     PT Treatment/Interventions ADLs/Self Care Home Management;Cryotherapy;Electrical Stimulation;Moist Heat;Ultrasound;Neuromuscular re-education;Balance training;Therapeutic exercise;Therapeutic activities;Functional mobility training;Stair training;Gait training;Patient/family education;Manual techniques;Passive range of motion;Vasopneumatic Device;Taping;Dry needling   PT Next Visit Plan transition to home program; d/c vs 30 day hold       Patient will benefit from skilled therapeutic intervention in order to improve the following deficits and impairments:  Decreased activity tolerance, Decreased range of motion, Decreased strength, Increased muscle spasms, Pain  Visit Diagnosis: Pain in right hip  Muscle weakness (generalized)     Problem List Patient Active Problem List   Diagnosis Date Noted  . 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 Impingement syndrome of right shoulder 08/07/2014  . Pain in the chest 02/09/2014  . Shortness of breath 02/09/2014  . Gastroesophageal reflux disease without esophagitis 02/09/2014  .  Other and unspecified hyperlipidemia 09/08/2013  . Hereditary and idiopathic peripheral neuropathy 07/22/2013  . Allergic rhinitis, cause unspecified 07/14/2013  . Pain in joint, pelvic region and thigh 07/14/2013  . Elevated blood pressure 05/11/2013  . Insomnia 05/11/2013  . BPH (benign prostatic hypertrophy) 05/11/2013  . Helicobacter pylori (H. pylori) 02/14/2013  . Irritable bowel syndrome 02/14/2013  . Encounter for therapeutic drug monitoring 02/09/2013  . GERD (gastroesophageal reflux disease) 02/09/2013    Kermit BaloMicah Kron Everton, PTA 11/14/16 6:50 PM  [facility] [address]  [address] [place], [place], [address] Phone: [phone]   Fax:  [phone]  Name: Jon Bush MRN: [id number] Date of Birth: 12-11-1945

## 2016-11-17 ENCOUNTER — Ambulatory Visit: Payer: Medicare Other | Admitting: Physical Therapy

## 2016-11-17 DIAGNOSIS — M6281 Muscle weakness (generalized): Secondary | ICD-10-CM

## 2016-11-17 DIAGNOSIS — M25551 Pain in right hip: Secondary | ICD-10-CM

## 2016-11-17 NOTE — Patient Instructions (Signed)
Hip Hinge Hold 10-15 seconds - x 10 reps - keeping back flat

## 2016-11-17 NOTE — Therapy (Addendum)
[facility] [address]  [place], [place], [address] Phone: [phone]   Fax:  [phone]  Physical Therapy Treatment  Patient Details  Name: Jon Bush MRN: [id number] Date of Birth: April 09, 1946 Referring Provider: Judith Part. Chabon  Encounter Date: 11/17/2016      PT End of Session - 11/17/16 1014    Visit Number 19   Number of Visits 24   Date for PT Re-Evaluation 12/06/16   PT Start Time 1012   PT Stop Time 1059   PT Time Calculation (min) 47 min   Activity Tolerance Patient tolerated treatment well   Behavior During Therapy WFL for tasks assessed/performed      Past Medical History:  Diagnosis Date  . Amaurosis fugax of left eye   . Anemia   . BPH (benign prostatic hyperplasia)   . Central retinal vein occlusion of left eye    (Left) Dr. Fenton Foy [facility]); Dr. Newell [facility])   . Enlarged prostate   . H. pylori infection 02/14/2013  . High cholesterol   . Hypertension   . IBS (irritable bowel syndrome)   . Kidney stone   . Kidney stones   . Macular hole of left eye 2005  . Migraine headache   . Retinal detachment 2005  . Sleep apnea    cpap    Past Surgical History:  Procedure Laterality Date  . HERNIA REPAIR    . KIDNEY STONE SURGERY    . LITHOTRIPSY    . RETINAL DETACHMENT SURGERY    . REVERSE SHOULDER ARTHROPLASTY Right 02/18/2016  . REVERSE SHOULDER ARTHROPLASTY Right 02/18/2016   Procedure: RIGHT REVERSE SHOULDER ARTHROPLASTY;  Surgeon: Justice Britain, MD;  Location: [facility];  Service: Orthopedics;  Laterality: Right;    There were no vitals filed for this visit.      Subjective Assessment - 11/17/16 1013    Subjective Patient reporting he started taking glucosamine/chondroitin - hasn't noticed a difference. Still has daily pain   Patient Stated Goals Reduce pain   Currently in Pain? Yes   Pain Score 3    Pain Location Buttocks   Pain Orientation Right    Pain Descriptors / Indicators Aching   Pain Type Acute pain   Pain Frequency Intermittent   Aggravating Factors  some driving   Pain Relieving Factors massage, stretching                         [facility] Adult PT Treatment/Exercise - 11/17/16 0001      Knee/Hip Exercises: Stretches   Passive Hamstring Stretch Right;3 reps;30 seconds   Passive Hamstring Stretch Limitations seated   Piriformis Stretch Right;3 reps;30 seconds   Piriformis Stretch Limitations seated   Other Knee/Hip Stretches seated self massage wit ball - education for figure 4 position     Knee/Hip Exercises: Aerobic   Nustep Level 6 x 8 minutes     Knee/Hip Exercises: Standing   Other Standing Knee Exercises hip hinge - 10 x 10"     Knee/Hip Exercises: Seated   Other Seated Knee/Hip Exercises Sciatic nerve glide x 10 reps                  PT Short Term Goals - 04/07/16 1247      PT SHORT TERM GOAL #1   Title improve PROM to WNL for improved motion (04/11/16)   Baseline achieved within protocol limitations   Status Achieved  PT SHORT TERM GOAL #2   Title independent with initial HEP (04/11/16)   Status Achieved           PT Long Term Goals - 24-Nov-2016 1023      PT LONG TERM GOAL #1   Title Patient to be independent with advanced HEP (12/06/16)   Status Achieved     PT LONG TERM GOAL #2   Title Patient to improve R LE flexibility of HS, IT band and piriformis with good compliance with stretching independently (12/06/16)   Status Achieved     PT LONG TERM GOAL #3   Title Patient to report ability to return to household work with no increase in pain (12/06/16)   Status Partially Met     PT LONG TERM GOAL #4   Title Patient to improve B LE strength to >/= 4+/5 with no increase in pain (12/06/16)   Status Achieved     PT LONG TERM GOAL #5   Title patient to demonstrate supine to sit with pain no greater than 1/10 for greater than 2 weeks (10/12/16)   Status Achieved                Plan - 2016-11-24 1019    Clinical Impression Statement Patient today reporting some continued daily pain with no particular pattern, however, feels like he has improved since starting PT. Review of exercises today along with hip hinge and sciatic nerve glide with patient able to perform well. PT and patient discussing 30 day hold today, as both feel as patient has plateaued with current treatment. Discussion with patient to return to MD if symtpoms persists or become worse with good verbal understanding. Patient achieving or partially meeting all established goals as this time.    PT Treatment/Interventions ADLs/Self Care Home Management;Cryotherapy;Electrical Stimulation;Moist Heat;Ultrasound;Neuromuscular re-education;Balance training;Therapeutic exercise;Therapeutic activities;Functional mobility training;Stair training;Gait training;Patient/family education;Manual techniques;Passive range of motion;Vasopneumatic Device;Taping;Dry needling   Consulted and Agree with Plan of Care Patient      Patient will benefit from skilled therapeutic intervention in order to improve the following deficits and impairments:  Decreased activity tolerance, Decreased range of motion, Decreased strength, Increased muscle spasms, Pain  Visit Diagnosis: Pain in right hip  Muscle weakness (generalized)       G-Codes - 2016/11/24 1244    Functional Assessment Tool Used (Outpatient Only) FOTO: 63 (37% limited)   Functional Limitation Mobility: Walking and moving around   Mobility: Walking and Moving Around Goal Status [phone]) At least 20 percent but less than 40 percent impaired, limited or restricted   Mobility: Walking and Moving Around Discharge Status [phone]) At least 20 percent but less than 40 percent impaired, limited or restricted      Problem List Patient Active Problem List   Diagnosis Date Noted  . 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 Impingement syndrome of  right shoulder 08/07/2014  . Pain in the chest 02/09/2014  . Shortness of breath 02/09/2014  . Gastroesophageal reflux disease without esophagitis 02/09/2014  . Other and unspecified hyperlipidemia 09/08/2013  . Hereditary and idiopathic peripheral neuropathy 07/22/2013  . Allergic rhinitis, cause unspecified 07/14/2013  . Pain in joint, pelvic region and thigh 07/14/2013  . Elevated blood pressure 05/11/2013  . Insomnia 05/11/2013  . BPH (benign prostatic hypertrophy) 05/11/2013  . Helicobacter pylori (H. pylori) 02/14/2013  . Irritable bowel syndrome 02/14/2013  . Encounter for therapeutic drug monitoring 02/09/2013  . GERD (gastroesophageal reflux disease) 02/09/2013     Lanney Gins, PT, DPT  11/17/16 12:46 PM  PHYSICAL THERAPY DISCHARGE SUMMARY  Visits from Start of Care: 19  Current functional level related to goals / functional outcomes: See above   Remaining deficits: See above   Education / Equipment: HEP  Plan: Patient agrees to discharge.  Patient goals were partially met. Patient is being discharged due to being pleased with the current functional level.  ?????    Lanney Gins, PT, DPT 01/30/17 11:38 AM  [facility] [address]  [place], [place], [address] Phone: [phone]   Fax:  [phone]  Name: Jon Bush MRN: [id number] Date of Birth: [birth year]

## 2017-04-07 IMAGING — CR DG CHEST 2V
2 series · 2 of 2 positions shown · non-contrast
Comparison: 06/19/2011

CLINICAL DATA: Gastric/lower chest pain. No fever. Htn. No Haji Murtaza. No
cough or congestion. No hx mi or stroke.

EXAM:
CHEST  2 VIEW

[w chest pa]
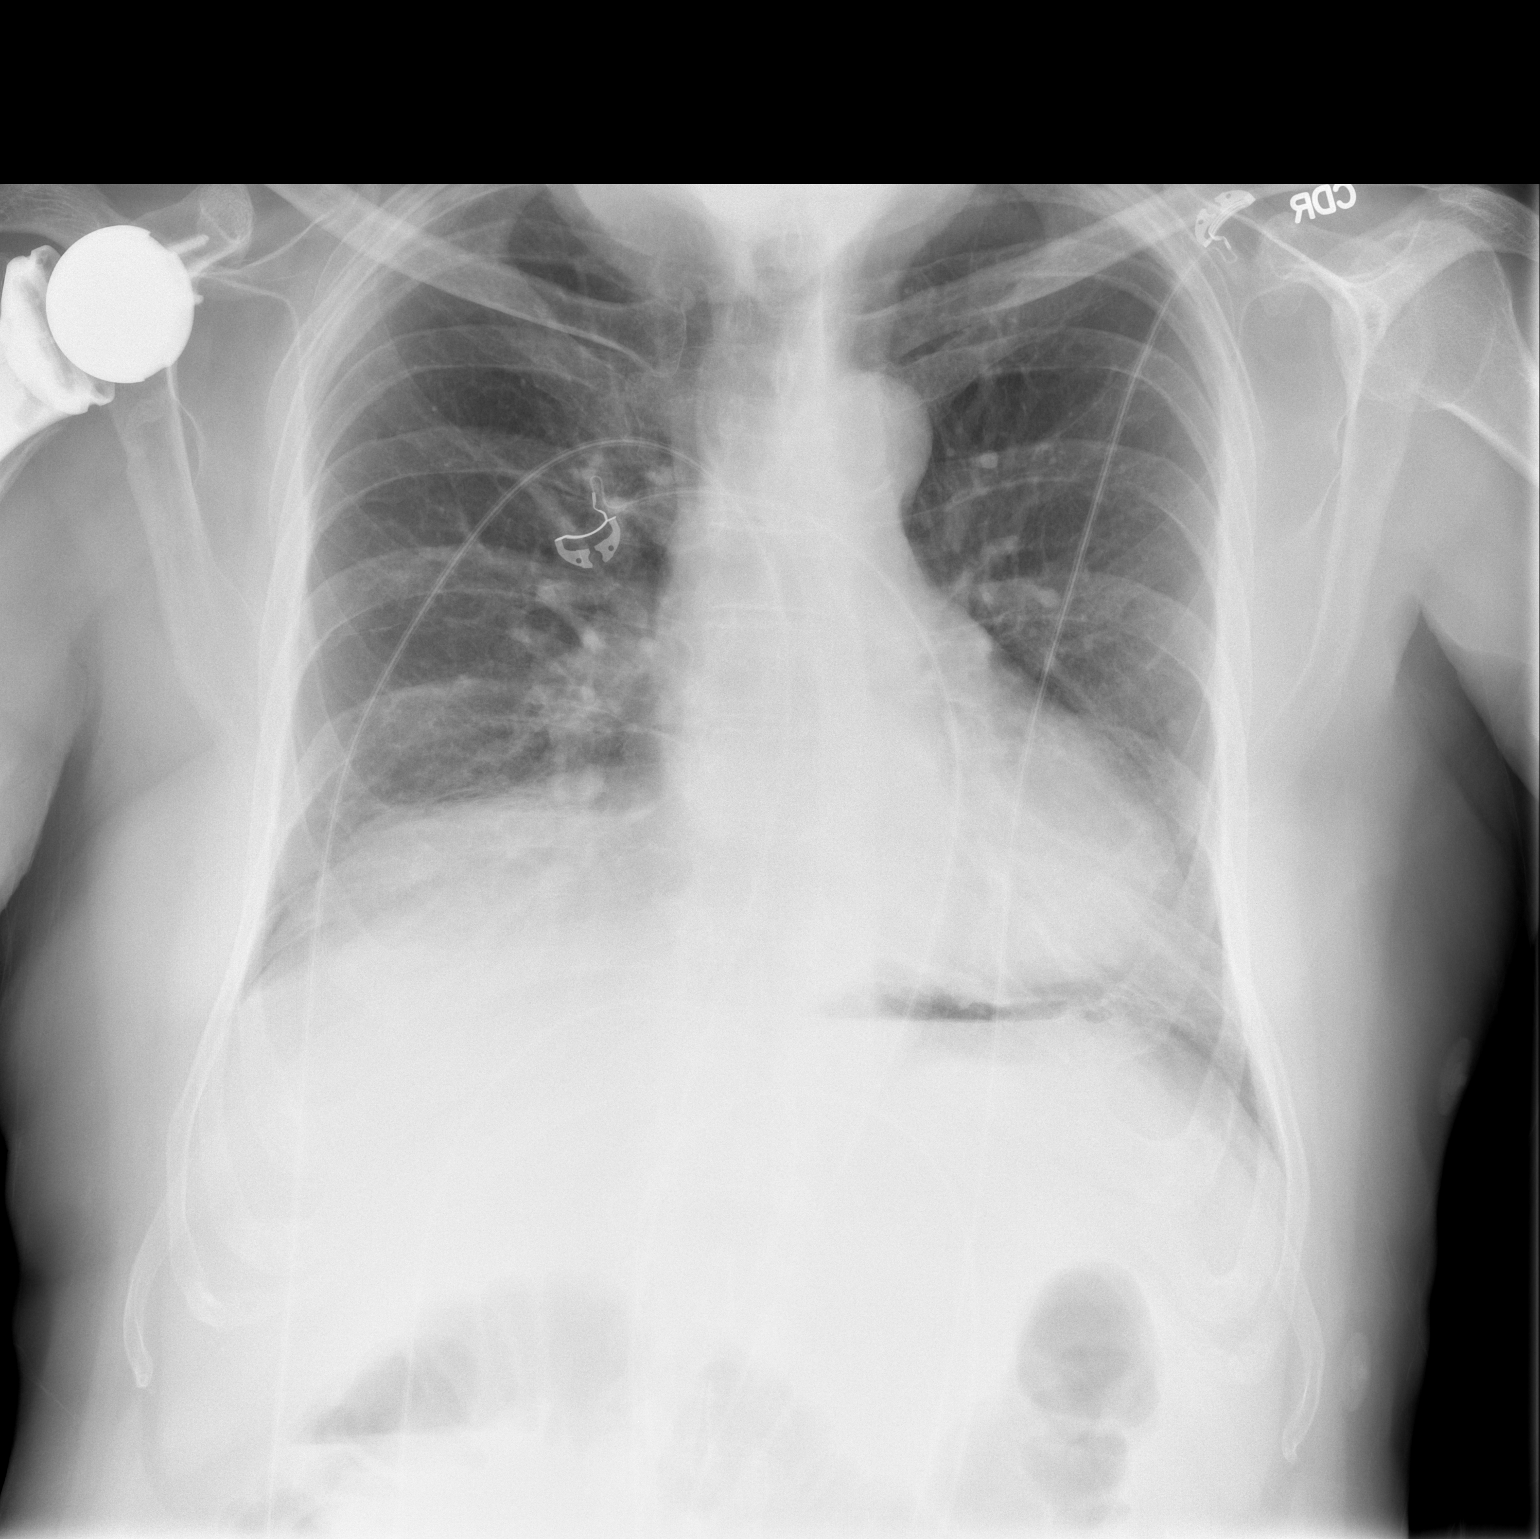

[w chest lat]
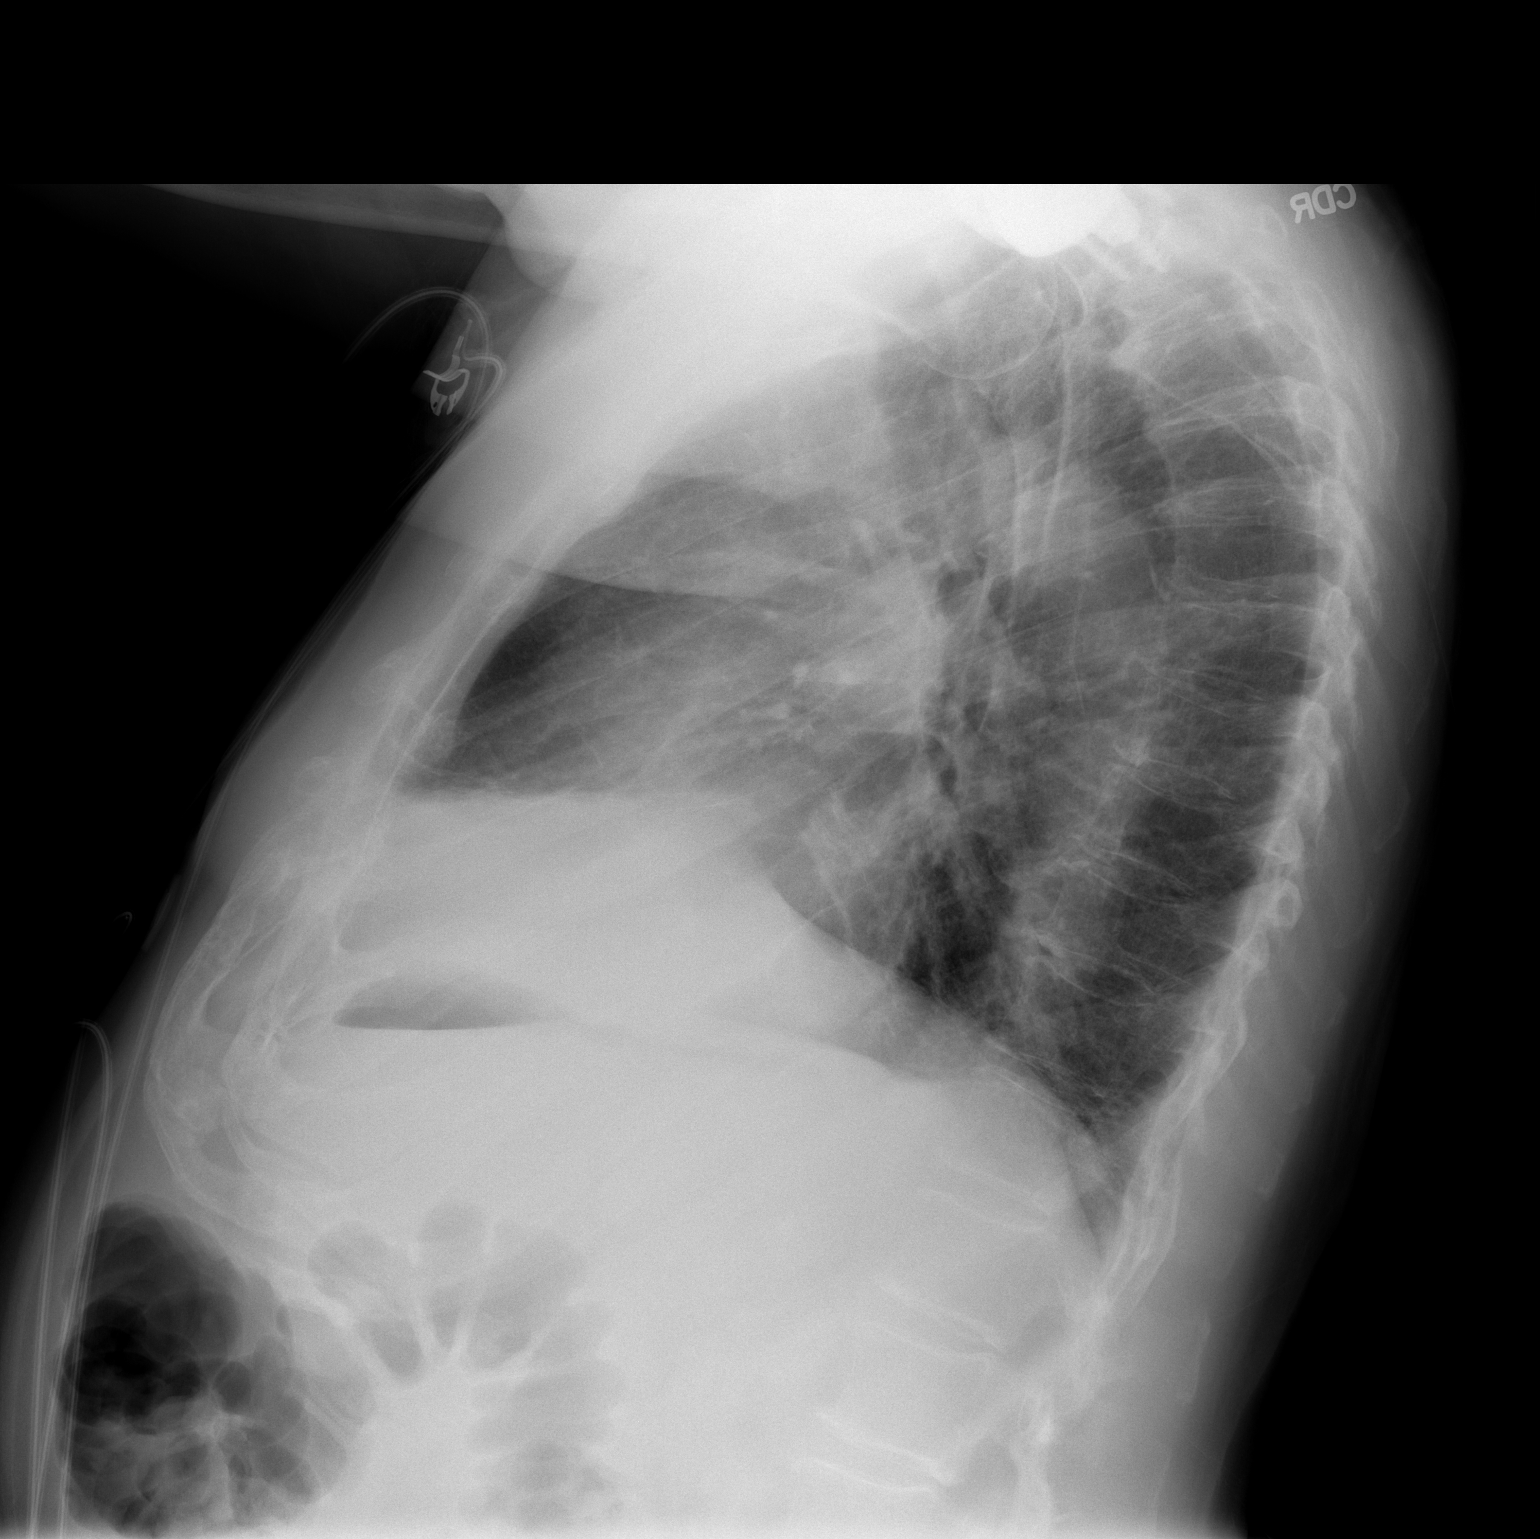

[2 of 2 positions shown; findings below may reference images not displayed]

FINDINGS: The cardiac silhouette is borderline enlarged. No mediastinal or
hilar masses. No convincing adenopathy.

Lungs are clear.  No pleural effusion.  No pneumothorax.

Right shoulder reverse prosthesis is new since the prior exam,
incompletely imaged, but well aligned. Skeletal structures are
demineralized but otherwise intact.
IMPRESSION: No acute cardiopulmonary disease.

## 2018-05-02 ENCOUNTER — Other Ambulatory Visit: Payer: Self-pay

## 2018-05-02 ENCOUNTER — Ambulatory Visit: Payer: Medicare Other | Attending: Physician Assistant | Admitting: Physical Therapy

## 2018-05-02 DIAGNOSIS — M6281 Muscle weakness (generalized): Secondary | ICD-10-CM | POA: Insufficient documentation

## 2018-05-02 DIAGNOSIS — R262 Difficulty in walking, not elsewhere classified: Secondary | ICD-10-CM | POA: Insufficient documentation

## 2018-05-02 DIAGNOSIS — M25551 Pain in right hip: Secondary | ICD-10-CM

## 2018-05-02 DIAGNOSIS — R252 Cramp and spasm: Secondary | ICD-10-CM | POA: Diagnosis present

## 2018-05-02 DIAGNOSIS — M5416 Radiculopathy, lumbar region: Secondary | ICD-10-CM | POA: Diagnosis present

## 2018-05-02 NOTE — Therapy (Addendum)
[facility] Outpatient Rehabilitation [facility] [address]  [address] [place], [place], [address] Phone: [phone]   Fax:  [phone]  Physical Therapy Evaluation  Patient Details  Name: Jon Bush MRN: [id number] Date of Birth: 01/13/1946 Referring Provider: Jodene Nam, PA-C   Encounter Date: 05/02/2018  PT End of Session - 05/02/18 0940    Visit Number  1    Number of Visits  10    Date for PT Re-Evaluation  06/08/18    Authorization Type  Medicare & BCBS    PT Start Time  0845    PT Stop Time  0940    PT Time Calculation (min)  55 min       Past Medical History:  Diagnosis Date  . Amaurosis fugax of left eye   . Anemia   . BPH (benign prostatic hyperplasia)   . Central retinal vein occlusion of left eye    (Left) Dr. Guy Begin [facility]); Dr. Memory Argue [facility])   . Enlarged prostate   . H. pylori infection 02/14/2013  . High cholesterol   . Hypertension   . IBS (irritable bowel syndrome)   . Kidney stone   . Kidney stones   . Macular hole of left eye 2005  . Migraine headache   . Retinal detachment 2005  . Sleep apnea    cpap    Past Surgical History:  Procedure Laterality Date  . HERNIA REPAIR    . KIDNEY STONE SURGERY    . LITHOTRIPSY    . RETINAL DETACHMENT SURGERY    . REVERSE SHOULDER ARTHROPLASTY Right 02/18/2016  . REVERSE SHOULDER ARTHROPLASTY Right 02/18/2016   Procedure: RIGHT REVERSE SHOULDER ARTHROPLASTY;  Surgeon: Francena Hanly, MD;  Location: [facility] OR;  Service: Orthopedics;  Laterality: Right;    There were no vitals filed for this visit.   Subjective Assessment - 05/02/18 0847    Subjective  Pt reporting return of pain in R posterior hip/buttock last week after changing his sleeping pattern due to unable to sleep on L arm following injection. Saw MD and x-rays done showing worsening of lumbar DDD with near total loss of disk height at one level per pt recall. Notes good benefit from PT for similar  issues last year so MD referred him back to PT.    Pertinent History  Lumbar DDD & spinal stenosis    Limitations  Sitting;Walking    How long can you sit comfortably?  20-30 minutes    Patient Stated Goals  "to get back to where I was"    Currently in Pain?  Yes    Pain Score  3    5/10 on average, 8/10 at worst   Pain Location  Buttocks    Pain Orientation  Right    Pain Descriptors / Indicators  Discomfort    Pain Type  Acute pain;Chronic pain    Pain Radiating Towards  intermittent pain and/or numbness down lateral leg to foot - lessening over last few days    Pain Onset  1 to 4 weeks ago    Pain Frequency  Intermittent    Aggravating Factors   prolonged sitting    Pain Relieving Factors  OTC meds    Effect of Pain on Daily Activities  limits driving tolerance         [facility] PT Assessment - 05/02/18 0845      Assessment   Medical Diagnosis  R buttock pain/lumbar radiculopathy    Referring Provider  Jodene Nam, PA-C    Onset Date/Surgical Date  --   ~1.5 wks   Next MD Visit  05/14/18    Prior Therapy  PT in 2018 for similar issue      Precautions   Precautions  None      Balance Screen   Has the patient fallen in the past 6 months  No    Has the patient had a decrease in activity level because of a fear of falling?   No    Is the patient reluctant to leave their home because of a fear of falling?   No      Home Environment   Living Environment  Private residence    Type of Home  House    Home Access  Level entry    Home Layout  One level      Prior Function   Level of Independence  Independent    Vocation  Retired    Leisure  [employer] technical [employer] 3-4 days/wk with exercise classes 2 days/wk      Cognition   Overall Cognitive Status  Within Functional Limits for tasks assessed      Observation/Other Assessments   Focus on Therapeutic Outcomes (FOTO)   Lumbar spine - 54% (46% limitaiton); predicted 71% (29% limitation) in 10 visits       Posture/Postural Control   Posture/Postural Control  Postural limitations    Postural Limitations  Rounded Shoulders;Forward head;Decreased lumbar lordosis      AROM   AROM Assessment Site  Lumbar    Lumbar Flexion  WNL - no pain    Lumbar Extension  WNL - no pain    Lumbar - Right Side Bend  fingertip to fibular head - no pain    Lumbar - Left Side Bend  fingertip to fibular head - no pain    Lumbar - Right Rotation  WNL - no pain    Lumbar - Left Rotation  WNL - no pain      Strength   Strength Assessment Site  Hip;Knee;Ankle    Right/Left Hip  Right;Left    Right Hip Flexion  4/5    Right Hip Extension  4-/5    Right Hip External Rotation   4-/5    Right Hip Internal Rotation  4+/5    Right Hip ABduction  4/5    Right Hip ADduction  4/5    Left Hip Flexion  4/5    Left Hip Extension  4/5    Left Hip External Rotation  4/5    Left Hip Internal Rotation  4+/5    Left Hip ABduction  4+/5    Left Hip ADduction  4+/5    Right/Left Knee  Right;Left    Right Knee Flexion  4+/5    Right Knee Extension  4+/5    Left Knee Flexion  4+/5    Left Knee Extension  4+/5    Right/Left Ankle  Right;Left    Right Ankle Dorsiflexion  4-/5    Left Ankle Dorsiflexion  4/5      Flexibility   Soft Tissue Assessment /Muscle Length  yes    Hamstrings  B mod tightness    ITB  R mild/mod tightness    Piriformis  R mod tightness      Special Tests    Special Tests  Lumbar;Hip Special Tests    Lumbar Tests  Straight Leg Raise    Hip Special Tests   Hip Scouring  Straight Leg Raise   Findings  Negative    Side   Right      Hip Scouring   Findings  Negative    Side  Right                Objective measurements completed on examination: See above findings.              PT Education - 05/02/18 0940    Education provided  Yes    Education Details  PT eval findings, anticipated POC & initial HEP    Person(s) Educated  Patient    Methods   Explanation;Demonstration;Handout    Comprehension  Verbalized understanding;Returned demonstration;Need further instruction          PT Long Term Goals - 05/02/18 0959      PT LONG TERM GOAL #1   Title  Patient to be independent with ongoing/advanced HEP    Status  New    Target Date  06/08/18      PT LONG TERM GOAL #2   Title  Patient to improve R LE flexibility of HS, IT band and piriformis with good compliance with stretching independently    Status  New    Target Date  06/08/18      PT LONG TERM GOAL #3   Title  Patient to improve B LE strength to >/= 4+/5 with no increase in pain     Status  New    Target Date  06/08/18      PT LONG TERM GOAL #4   Title  Patient to report ability to return to household chores & gym workouts with no increase in back/buttock pain    Status  New             Plan - 05/02/18 0948    Clinical Impression Statement  Mr. Lachapelle is a 72 y/o male who presents to OP PT for a low complexity evaluation for recurrent R buttock pain presumed to be lumbar radicular pain, with patient also noting intermittent pain and/or numbness down R LE to foot. Patient today with good AROM of lumbar spine w/o pain reported, however tightness/limited flexibility present in B hamstring musculature, R IT band, R piriformis and R quads/hip flexors. Mild decreased LE strength noted R vs L. Patient mildly tender to deep palpation along R glute complex, piriformis and R posterior thigh. Pain limits sitting and driving tolerance. Patient issued HEP today with focus on stretching posterior thigh and buttock musculature. Patient to benefit from skilled PT to address the above listed deficits to maximize function.    Clinical Presentation  Stable    Clinical Presentation due to:  recent onset with some improvement already noted    Clinical Decision Making  Low    Rehab Potential  Good    PT Frequency  2x / week    PT Duration  --   5 weeks   PT Treatment/Interventions   ADLs/Self Care Home Management;Cryotherapy;Electrical Stimulation;Moist Heat;Ultrasound;Neuromuscular re-education;Balance training;Therapeutic exercise;Therapeutic activities;Functional mobility training;Stair training;Gait training;Patient/family education;Manual techniques;Passive range of motion;Vasopneumatic Device;Taping;Dry needling;Iontophoresis 4mg /ml Dexamethasone    Consulted and Agree with Plan of Care  Patient       Patient will benefit from skilled therapeutic intervention in order to improve the following deficits and impairments:  Pain, Increased muscle spasms, Impaired flexibility, Decreased range of motion, Decreased strength, Decreased activity tolerance  Visit Diagnosis: Pain in right hip - Plan: PT plan of care cert/re-cert  Radiculopathy, lumbar region - Plan: PT  plan of care cert/re-cert  Cramp and spasm - Plan: PT plan of care cert/re-cert  Muscle weakness (generalized) - Plan: PT plan of care cert/re-cert  Difficulty in walking, not elsewhere classified - Plan: PT plan of care cert/re-cert     Problem List Patient Active Problem List   Diagnosis Date Noted  . 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 Impingement syndrome of right shoulder 08/07/2014  . Pain in the chest 02/09/2014  . Shortness of breath 02/09/2014  . Gastroesophageal reflux disease without esophagitis 02/09/2014  . Other and unspecified hyperlipidemia 09/08/2013  . Hereditary and idiopathic peripheral neuropathy 07/22/2013  . Allergic rhinitis, cause unspecified 07/14/2013  . Pain in joint, pelvic region and thigh 07/14/2013  . Elevated blood pressure 05/11/2013  . Insomnia 05/11/2013  . BPH (benign prostatic hypertrophy) 05/11/2013  . Helicobacter pylori (H. pylori) 02/14/2013  . Irritable bowel syndrome 02/14/2013  . Encounter for therapeutic drug monitoring 02/09/2013  . GERD (gastroesophageal reflux disease) 02/09/2013    Marry Guan, PT, MPT 05/02/2018, 11:41  AM  [facility] [address]  [address] [place], [place], [address] Phone: [phone]   Fax:  [phone]  Name: Rustyn Conery MRN: [id number] Date of Birth: August 21, 1946

## 2018-05-08 ENCOUNTER — Encounter: Payer: Self-pay | Admitting: Physical Therapy

## 2018-05-08 ENCOUNTER — Ambulatory Visit: Payer: Medicare Other | Admitting: Physical Therapy

## 2018-05-08 DIAGNOSIS — M6281 Muscle weakness (generalized): Secondary | ICD-10-CM

## 2018-05-08 DIAGNOSIS — M25551 Pain in right hip: Secondary | ICD-10-CM

## 2018-05-08 DIAGNOSIS — R262 Difficulty in walking, not elsewhere classified: Secondary | ICD-10-CM

## 2018-05-08 DIAGNOSIS — M5416 Radiculopathy, lumbar region: Secondary | ICD-10-CM

## 2018-05-08 DIAGNOSIS — R252 Cramp and spasm: Secondary | ICD-10-CM

## 2018-05-08 NOTE — Patient Instructions (Signed)

## 2018-05-08 NOTE — Therapy (Signed)
[facility] Outpatient Rehabilitation [facility] [address]  [address] [place], [place], [address] Phone: [phone]   Fax:  [phone]  Physical Therapy Treatment  Patient Details  Name: Jon Bush MRN: [id number] Date of Birth: 20-Aug-1946 Referring Provider: Jodene Nam, PA-C   Encounter Date: 05/08/2018  PT End of Session - 05/08/18 1405    Visit Number  2    Number of Visits  10    Date for PT Re-Evaluation  06/08/18    Authorization Type  Medicare & BCBS    PT Start Time  1405    PT Stop Time  1449    PT Time Calculation (min)  44 min    Activity Tolerance  Patient tolerated treatment well    Behavior During Therapy  [facility] for tasks assessed/performed       Past Medical History:  Diagnosis Date  . Amaurosis fugax of left eye   . Anemia   . BPH (benign prostatic hyperplasia)   . Central retinal vein occlusion of left eye    (Left) Dr. Guy Begin [facility]); Dr. Memory Argue [facility])   . Enlarged prostate   . H. pylori infection 02/14/2013  . High cholesterol   . Hypertension   . IBS (irritable bowel syndrome)   . Kidney stone   . Kidney stones   . Macular hole of left eye 2005  . Migraine headache   . Retinal detachment 2005  . Sleep apnea    cpap    Past Surgical History:  Procedure Laterality Date  . HERNIA REPAIR    . KIDNEY STONE SURGERY    . LITHOTRIPSY    . RETINAL DETACHMENT SURGERY    . REVERSE SHOULDER ARTHROPLASTY Right 02/18/2016  . REVERSE SHOULDER ARTHROPLASTY Right 02/18/2016   Procedure: RIGHT REVERSE SHOULDER ARTHROPLASTY;  Surgeon: Francena Hanly, MD;  Location: [facility] OR;  Service: Orthopedics;  Laterality: Right;    There were no vitals filed for this visit.  Subjective Assessment - 05/08/18 1409    Subjective  Pt noting benefit from stretches. Still having intermittent pain but none on arrival to PT.    Pertinent History  Lumbar DDD & spinal stenosis    Limitations  Sitting;Walking    Patient Stated  Goals  "to get back to where I was"    Currently in Pain?  No/denies    Pain Score  0-No pain                       [facility] Adult PT Treatment/Exercise - 05/08/18 1405      Exercises   Exercises  Lumbar;Knee/Hip      Lumbar Exercises: Stretches   Passive Hamstring Stretch  Right;30 seconds;2 reps    Passive Hamstring Stretch Limitations  supine with strap    Hip Flexor Stretch  Right;30 seconds;2 reps    Hip Flexor Stretch Limitations  mod thomas with strap    ITB Stretch  Right;30 seconds;2 reps    ITB Stretch Limitations  supine with strap     Piriformis Stretch  Right;30 seconds;2 reps    Piriformis Stretch Limitations  KTOS      Lumbar Exercises: Standing   Heel Raises  10 reps;3 seconds    Heel Raises Limitations  cues for quad & glut set    Functional Squats  10 reps;2 seconds    Functional Squats Limitations  counter squat      Lumbar Exercises: Supine   Ab Set  10  reps;5 seconds    Clam  10 reps;5 seconds    Clam Limitations  abd bracing + alt hip ABD/ER with red TB    Bent Knee Raise  10 reps;3 seconds    Bent Knee Raise Limitations  brace marching with red TB - pt noting increase discomfort in glutes noted by end of set      Knee/Hip Exercises: Standing   Hip Extension  Right;Left;10 reps;Stengthening    Extension Limitations  45 degree kickback      Manual Therapy   Manual Therapy  Soft tissue mobilization;Myofascial release    Soft tissue mobilization  STM to R glute complex & piriformis - some spasming with deep pressure    Myofascial Release  manual TPR R glute minimus              PT Education - 05/08/18 1445    Education provided  Yes    Education Details  Education of role of DN in addressing increased muscle tension/TPs    Person(s) Educated  Patient    Methods  Explanation;Handout    Comprehension  Verbalized understanding;Need further instruction          PT Long Term Goals - 05/08/18 1411      PT LONG TERM GOAL #1    Title  Patient to be independent with ongoing/advanced HEP    Status  On-going      PT LONG TERM GOAL #2   Title  Patient to improve R LE flexibility of HS, IT band and piriformis with good compliance with stretching independently    Status  On-going      PT LONG TERM GOAL #3   Title  Patient to improve B LE strength to >/= 4+/5 with no increase in pain     Status  On-going      PT LONG TERM GOAL #4   Title  Patient to report ability to return to household chores & gym workouts with no increase in back/buttock pain    Status  On-going            Plan - 05/08/18 1412    Clinical Impression Statement  Reviewed initial HEP stretches with minor clarifications necessary for hip flexor & piriformis stretches. Initiated lumbopelvic strengthening with emphasis on abdominal bracing for lumbar support during proximal LE strengthening - good tolerance other than increased R glute pain/discomfort noted following brace marching. Increased muscle tension evident in R glute complex, esp glute minimus/medius, which was addressed with STM and manual TPR with good relief of pain noted by pt - may benefit from DN if this persists in future visits, therefore provided initial education in role of DN.    Rehab Potential  Good    PT Treatment/Interventions  ADLs/Self Care Home Management;Cryotherapy;Electrical Stimulation;Moist Heat;Ultrasound;Neuromuscular re-education;Balance training;Therapeutic exercise;Therapeutic activities;Functional mobility training;Stair training;Gait training;Patient/family education;Manual techniques;Passive range of motion;Vasopneumatic Device;Taping;Dry needling;Iontophoresis 4mg /ml Dexamethasone    Consulted and Agree with Plan of Care  Patient       Patient will benefit from skilled therapeutic intervention in order to improve the following deficits and impairments:  Pain, Increased muscle spasms, Impaired flexibility, Decreased range of motion, Decreased strength, Decreased  activity tolerance  Visit Diagnosis: Pain in right hip  Radiculopathy, lumbar region  Cramp and spasm  Muscle weakness (generalized)  Difficulty in walking, not elsewhere classified     Problem List Patient Active Problem List   Diagnosis Date Noted  . 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 Impingement syndrome of  right shoulder 08/07/2014  . Pain in the chest 02/09/2014  . Shortness of breath 02/09/2014  . Gastroesophageal reflux disease without esophagitis 02/09/2014  . Other and unspecified hyperlipidemia 09/08/2013  . Hereditary and idiopathic peripheral neuropathy 07/22/2013  . Allergic rhinitis, cause unspecified 07/14/2013  . Pain in joint, pelvic region and thigh 07/14/2013  . Elevated blood pressure 05/11/2013  . Insomnia 05/11/2013  . BPH (benign prostatic hypertrophy) 05/11/2013  . Helicobacter pylori (H. pylori) 02/14/2013  . Irritable bowel syndrome 02/14/2013  . Encounter for therapeutic drug monitoring 02/09/2013  . GERD (gastroesophageal reflux disease) 02/09/2013    Marry Guan, PT, MPT 05/08/2018, 3:02 PM  [facility] [address]  [address] [place], [place], [address] Phone: [phone]   Fax:  [phone]  Name: Graysen Woodyard MRN: [id number] Date of Birth: Mar 17, 1946

## 2018-05-11 ENCOUNTER — Ambulatory Visit: Payer: Medicare Other | Admitting: Physical Therapy

## 2018-05-11 ENCOUNTER — Encounter: Payer: Self-pay | Admitting: Physical Therapy

## 2018-05-11 DIAGNOSIS — R262 Difficulty in walking, not elsewhere classified: Secondary | ICD-10-CM

## 2018-05-11 DIAGNOSIS — M6281 Muscle weakness (generalized): Secondary | ICD-10-CM

## 2018-05-11 DIAGNOSIS — M5416 Radiculopathy, lumbar region: Secondary | ICD-10-CM

## 2018-05-11 DIAGNOSIS — M25551 Pain in right hip: Secondary | ICD-10-CM

## 2018-05-11 DIAGNOSIS — R252 Cramp and spasm: Secondary | ICD-10-CM

## 2018-05-11 NOTE — Therapy (Signed)
[facility] [address]  [address] [place], [place], [address] Phone: [phone]   Fax:  [phone]  Physical Therapy Treatment  Patient Details  Name: Jon EmeryRichard Bush MRN: [id number] Date of Birth: [birth year] Referring Provider: Jodene NamStephen Chabon, PA-C   Encounter Date: 05/11/2018  PT End of Session - 05/11/18 1101    Visit Number  3    Number of Visits  10    Date for PT Re-Evaluation  06/08/18    Authorization Type  Medicare & BCBS    PT Start Time  1101    PT Stop Time  1151    PT Time Calculation (min)  50 min    Activity Tolerance  Patient tolerated treatment well    Behavior During Therapy  [facility] for tasks assessed/performed       Past Medical History:  Diagnosis Date  . Amaurosis fugax of left eye   . Anemia   . BPH (benign prostatic hyperplasia)   . Central retinal vein occlusion of left eye    (Left) Dr. Guy Beginobert DeVanzo [facility]); Dr. Memory Argueraig Grevin [facility]   . Enlarged prostate   . H. pylori infection 02/14/2013  . High cholesterol   . Hypertension   . IBS (irritable bowel syndrome)   . Kidney stone   . Kidney stones   . Macular hole of left eye 2005  . Migraine headache   . Retinal detachment 2005  . Sleep apnea    cpap    Past Surgical History:  Procedure Laterality Date  . HERNIA REPAIR    . KIDNEY STONE SURGERY    . LITHOTRIPSY    . RETINAL DETACHMENT SURGERY    . REVERSE SHOULDER ARTHROPLASTY Right 02/18/2016  . REVERSE SHOULDER ARTHROPLASTY Right 02/18/2016   Procedure: RIGHT REVERSE SHOULDER ARTHROPLASTY;  Surgeon: Francena HanlyKevin Supple, MD;  [facility]: [facility];  Service: Orthopedics;  Laterality: Right;    There were no vitals filed for this visit.  Subjective Assessment - 05/11/18 1105    Subjective  Pt reports he was able to return to the [facility] yesterday and resume his prior normal work-out (20 min on the bike + 10 laps walking around the gym). Still notes some achiness at times,  but no pain today.    Pertinent History  Lumbar DDD & spinal stenosis    Limitations  Sitting;Walking    Patient Stated Goals  "to get back to where I was"    Currently in Pain?  No/denies                       [facility] - 05/11/18 1101      Exercises   Exercises  Lumbar;Knee/Hip      Lumbar Exercises: Aerobic   Tread Mill  1.5 mph x 6 min      Lumbar Exercises: Standing   Heel Raises  10 reps;3 seconds    Heel Raises Limitations  cues for quad & glut set - slight discomfort noted in R glutes by end of set    Functional Squats  10 reps;3 seconds    Functional Squats Limitations  counter squat - slight discomfort noted in R glutes by 8th rep    Row  Both;10 reps;Theraband;Strengthening    Theraband Level (Row)  Level 3 (Green)    Row Limitations  staggered stance - cues for scap retraction, abd & glute activation    Shoulder Extension  Both;10 reps;Theraband;Strengthening  Theraband Level (Shoulder Extension)  Level 3 (Green)    Shoulder Extension Limitations  staggered stance - cues for scap retraction, abd & glute activation    Other Standing Lumbar Exercises  B pallof press with green TB      Lumbar Exercises: Supine   Clam  15 reps;5 seconds    Clam Limitations  abd bracing + alt hip ABD/ER with red TB    Bent Knee Raise  10 reps;3 seconds    Bent Knee Raise Limitations  brace marching with red TB - pt still noting slight discomfort in glutes by end of set but not bad    Bridge  10 reps;5 seconds;Non-compliant    Bridge Limitations  + hip ABD isometric with red TB      Lumbar Exercises: Sidelying   Clam  Right;10 reps;3 seconds    Clam Limitations  red TB      Manual Therapy   Manual Therapy  Soft tissue mobilization;Myofascial release    Soft tissue mobilization  STM to R glute complex & piriformis - some spasming with deep pressure    Myofascial Release  manual TPR R piriformis & glute minimus/medius with + twitch response              PT Education - 05/11/18 1151    Education provided  --    Education Details  HEP - initial strengthening exercises with red TB - pt instructed to stop if experiencing increased pain    Person(s) Educated  Patient    Methods  Explanation;Demonstration;Handout    Comprehension  Verbalized understanding;Returned demonstration          PT Long Term Goals - 05/08/18 1411      PT LONG TERM GOAL #1   Title  Patient to be independent with ongoing/advanced HEP    Status  On-going      PT LONG TERM GOAL #2   Title  Patient to improve R LE flexibility of HS, IT band and piriformis with good compliance with stretching independently    Status  On-going      PT LONG TERM GOAL #3   Title  Patient to improve B LE strength to >/= 4+/5 with no increase in pain     Status  On-going      PT LONG TERM GOAL #4   Title  Patient to report ability to return to household chores & gym workouts with no increase in back/buttock pain    Status  On-going            Plan - 05/11/18 1151    Clinical Impression Statement  Jon Bush already noting improvement with PT, reporting able to return to prior work-out at [employer] yesterday w/o limitation d/t lumbar radiculopathy or R buttock pain. Continued with strengthening progression with overall good tolerance but pt noting some fatigue or mild discomfort in R buttock by end of some sets of exercises - cautioned pt to stop exercises if noting significant increase in pain. HEP updated to include some of strengthening exercises and will monitor tolerance in upcoming visits.    Rehab Potential  Good    PT Treatment/Interventions  ADLs/Self Care Home Management;Cryotherapy;Electrical Stimulation;Moist Heat;Ultrasound;Neuromuscular re-education;Balance training;Therapeutic exercise;Therapeutic activities;Functional mobility training;Stair training;Gait training;Patient/family education;Manual techniques;Passive range of motion;Vasopneumatic  Device;Taping;Dry needling;Iontophoresis 4mg /ml Dexamethasone    Consulted and Agree with Plan of Care  Patient       Patient will benefit from skilled therapeutic intervention in order to improve the following deficits and  impairments:  Pain, Increased muscle spasms, Impaired flexibility, Decreased range of motion, Decreased strength, Decreased activity tolerance  Visit Diagnosis: Pain in right hip  Radiculopathy, lumbar region  Cramp and spasm  Muscle weakness (generalized)  Difficulty in walking, not elsewhere classified     Problem List Patient Active Problem List   Diagnosis Date Noted  . 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 Impingement syndrome of right shoulder 08/07/2014  . Pain in the chest 02/09/2014  . Shortness of breath 02/09/2014  . Gastroesophageal reflux disease without esophagitis 02/09/2014  . Other and unspecified hyperlipidemia 09/08/2013  . Hereditary and idiopathic peripheral neuropathy 07/22/2013  . Allergic rhinitis, cause unspecified 07/14/2013  . Pain in joint, pelvic region and thigh 07/14/2013  . Elevated blood pressure 05/11/2013  . Insomnia 05/11/2013  . BPH (benign prostatic hypertrophy) 05/11/2013  . Helicobacter pylori (H. pylori) 02/14/2013  . Irritable bowel syndrome 02/14/2013  . Encounter for therapeutic drug monitoring 02/09/2013  . GERD (gastroesophageal reflux disease) 02/09/2013    Marry Guan, PT, MPT 05/11/2018, 12:04 PM  [facility] [address]  [address] [place], [place], [address] Phone: [phone]   Fax:  [phone]  Name: Stephaun Million MRN: [id number] Date of Birth: Feb 28, 1946

## 2018-05-15 ENCOUNTER — Ambulatory Visit: Payer: Medicare Other | Admitting: Physical Therapy

## 2018-05-15 ENCOUNTER — Encounter: Payer: Self-pay | Admitting: Physical Therapy

## 2018-05-15 DIAGNOSIS — M5416 Radiculopathy, lumbar region: Secondary | ICD-10-CM

## 2018-05-15 DIAGNOSIS — M6281 Muscle weakness (generalized): Secondary | ICD-10-CM

## 2018-05-15 DIAGNOSIS — M25551 Pain in right hip: Secondary | ICD-10-CM | POA: Diagnosis not present

## 2018-05-15 DIAGNOSIS — R252 Cramp and spasm: Secondary | ICD-10-CM

## 2018-05-15 DIAGNOSIS — R262 Difficulty in walking, not elsewhere classified: Secondary | ICD-10-CM

## 2018-05-15 NOTE — Therapy (Signed)
[facility] [address]  [place], [place], [address] Phone: [phone]   Fax:  [phone]  Physical Therapy Treatment  Patient Details  Name: Jon Bush MRN: [id number] Date of Birth: Jan 13, 1946 Referring Provider: Gerrit Halls, PA-C   Encounter Date: 05/15/2018  PT End of Session - 05/15/18 1447    Visit Number  4    Number of Visits  10    Date for PT Re-Evaluation  06/08/18    Authorization Type  Medicare & BCBS    PT Start Time  9390    PT Stop Time  1528    PT Time Calculation (min)  41 min    Activity Tolerance  Patient tolerated treatment well    Behavior During Therapy  [facility] for tasks assessed/performed       Past Medical History:  Diagnosis Date  . Amaurosis fugax of left eye   . Anemia   . BPH (benign prostatic hyperplasia)   . Central retinal vein occlusion of left eye    (Left) Dr. Fenton Foy [facility]); Dr. Newell Coral [facility])   . Enlarged prostate   . H. pylori infection 02/14/2013  . High cholesterol   . Hypertension   . IBS (irritable bowel syndrome)   . Kidney stone   . Kidney stones   . Macular hole of left eye 2005  . Migraine headache   . Retinal detachment 2005  . Sleep apnea    cpap    Past Surgical History:  Procedure Laterality Date  . HERNIA REPAIR    . KIDNEY STONE SURGERY    . LITHOTRIPSY    . RETINAL DETACHMENT SURGERY    . REVERSE SHOULDER ARTHROPLASTY Right 02/18/2016  . REVERSE SHOULDER ARTHROPLASTY Right 02/18/2016   Procedure: RIGHT REVERSE SHOULDER ARTHROPLASTY;  Surgeon: Justice Britain, MD;  Location: [facility];  Service: Orthopedics;  Laterality: Right;    There were no vitals filed for this visit.  Subjective Assessment - 05/15/18 1450    Subjective  Doing well today. Saw MD yesterday and noted some increased pain after MD "poked around" but denies pain today. States MD feels like he is progressing well and can wrap up PT as he feels comfortable.     Pertinent History  Lumbar DDD & spinal stenosis    Limitations  Sitting;Walking    Patient Stated Goals  "to get back to where I was"    Currently in Pain?  No/denies         [facility] PT Assessment - 05/15/18 1447      Assessment   Medical Diagnosis  R buttock pain/lumbar radiculopathy    Referring Provider  Gerrit Halls, PA-C    Next MD Visit  06/07/18      Strength   Right Hip Flexion  4+/5    Right Hip Extension  4+/5    Right Hip External Rotation   4+/5    Right Hip Internal Rotation  4+/5    Right Hip ABduction  4+/5    Right Hip ADduction  4+/5    Left Hip Flexion  4+/5    Left Hip Extension  4+/5    Left Hip External Rotation  4+/5    Left Hip Internal Rotation  4+/5    Left Hip ABduction  4+/5    Left Hip ADduction  4+/5    Right Knee Flexion  5/5    Right Knee Extension  5/5    Left Knee Flexion  5/5    Left Knee Extension  5/5    Right Ankle Dorsiflexion  4+/5    Left Ankle Dorsiflexion  4+/5      Flexibility   Soft Tissue Assessment /Muscle Length  --    Hamstrings  B mild/mod tightness    ITB  R mild/mod tightness    Piriformis  R mild/mod tightness                   [facility] Adult PT Treatment/Exercise - 05/15/18 1447      Exercises   Exercises  Lumbar      Lumbar Exercises: Stretches   Passive Hamstring Stretch  Right;30 seconds;2 reps    Passive Hamstring Stretch Limitations  supine with strap    Hip Flexor Stretch  Right;30 seconds;2 reps    Hip Flexor Stretch Limitations  mod thomas with strap    ITB Stretch  Right;30 seconds;2 reps    ITB Stretch Limitations  supine with strap     Piriformis Stretch  Right;30 seconds;2 reps    Piriformis Stretch Limitations  KTOS      Lumbar Exercises: Aerobic   Recumbent Bike  L2 x 6 min      Manual Therapy   Manual Therapy  Soft tissue mobilization;Myofascial release    Soft tissue mobilization  STM to R glute complex & piriformis - some spasming with deep pressure over piriformis only     Myofascial Release  manual TPR R piriformis with + twitch response                  PT Long Term Goals - 05/15/18 1454      PT LONG TERM GOAL #1   Title  Patient to be independent with ongoing/advanced HEP    Status  Partially Met      PT LONG TERM GOAL #2   Title  Patient to improve R LE flexibility of HS, IT band and piriformis with good compliance with stretching independently    Status  Partially Met      PT LONG TERM GOAL #3   Title  Patient to improve B LE strength to >/= 4+/5 with no increase in pain     Status  Achieved      PT LONG TERM GOAL #4   Title  Patient to report ability to return to household chores & gym workouts with no increase in back/buttock pain    Status  Partially Met            Plan - 05/15/18 1455    Clinical Impression Statement  Al[place] reporting MD pleased with his progress and was able to defer steriod injection or steriod pack. Pt was instructed to continue with PT as benefit noted and to transition to HEP as he feels ready. B LE strength improved with R LE now more symmetrical with L. Flexibility remains more limited with some ongoing tightness/tenderness noted with palpation over R piriformis - improved after manual STM/TPR and review of stretches. All LTGs at least partially met at this time, with LE strength goal met. Will continue to address flexibility and muscle tension deficits and anticipate transition to HEP w/in next few visits.    Rehab Potential  Good    PT Treatment/Interventions  ADLs/Self Care Home Management;Cryotherapy;Electrical Stimulation;Moist Heat;Ultrasound;Neuromuscular re-education;Balance training;Therapeutic exercise;Therapeutic activities;Functional mobility training;Stair training;Gait training;Patient/family education;Manual techniques;Passive range of motion;Vasopneumatic Device;Taping;Dry needling;Iontophoresis 28m/ml Dexamethasone    Consulted and Agree with Plan of Care  Patient  Patient will  benefit from skilled therapeutic intervention in order to improve the following deficits and impairments:  Pain, Increased muscle spasms, Impaired flexibility, Decreased range of motion, Decreased strength, Decreased activity tolerance  Visit Diagnosis: Pain in right hip  Radiculopathy, lumbar region  Cramp and spasm  Muscle weakness (generalized)  Difficulty in walking, not elsewhere classified     Problem List Patient Active Problem List   Diagnosis Date Noted  . 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 Impingement syndrome of right shoulder 08/07/2014  . Pain in the chest 02/09/2014  . Shortness of breath 02/09/2014  . Gastroesophageal reflux disease without esophagitis 02/09/2014  . Other and unspecified hyperlipidemia 09/08/2013  . Hereditary and idiopathic peripheral neuropathy 07/22/2013  . Allergic rhinitis, cause unspecified 07/14/2013  . Pain in joint, pelvic region and thigh 07/14/2013  . Elevated blood pressure 05/11/2013  . Insomnia 05/11/2013  . BPH (benign prostatic hypertrophy) 05/11/2013  . Helicobacter pylori (H. pylori) 02/14/2013  . Irritable bowel syndrome 02/14/2013  . Encounter for therapeutic drug monitoring 02/09/2013  . GERD (gastroesophageal reflux disease) 02/09/2013    Percival Spanish, PT, MPT 05/15/2018, 6:29 PM  [facility] [address]  [place], [place], [address] Phone: [phone]   Fax:  [phone]  Name: Khylin Gutridge MRN: [id number] Date of Birth: 01/01/1946

## 2018-05-18 ENCOUNTER — Ambulatory Visit: Payer: Medicare Other

## 2018-05-18 DIAGNOSIS — M25551 Pain in right hip: Secondary | ICD-10-CM

## 2018-05-18 DIAGNOSIS — R252 Cramp and spasm: Secondary | ICD-10-CM

## 2018-05-18 DIAGNOSIS — M6281 Muscle weakness (generalized): Secondary | ICD-10-CM

## 2018-05-18 DIAGNOSIS — M5416 Radiculopathy, lumbar region: Secondary | ICD-10-CM

## 2018-05-18 DIAGNOSIS — R262 Difficulty in walking, not elsewhere classified: Secondary | ICD-10-CM

## 2018-05-18 NOTE — Therapy (Signed)
[facility] [address]  [place], [place], [address] Phone: [phone]   Fax:  [phone]  Physical Therapy Treatment  Patient Details  Name: Jon Bush MRN: [id number] Date of Birth: Jun 19, 1946 Referring Provider (PT): Gerrit Halls, PA-C   Encounter Date: 05/18/2018  PT End of Session - 05/18/18 1109    Visit Number  5    Number of Visits  10    Date for PT Re-Evaluation  06/08/18    Authorization Type  Medicare & BCBS    PT Start Time  1106    PT Stop Time  1150    PT Time Calculation (min)  44 min    Activity Tolerance  Patient tolerated treatment well    Behavior During Therapy  [facility] for tasks assessed/performed       Past Medical History:  Diagnosis Date  . Amaurosis fugax of left eye   . Anemia   . BPH (benign prostatic hyperplasia)   . Central retinal vein occlusion of left eye    (Left) Dr. Fenton Foy [facility] Dr. Newell Coral [facility])   . Enlarged prostate   . H. pylori infection 02/14/2013  . High cholesterol   . Hypertension   . IBS (irritable bowel syndrome)   . Kidney stone   . Kidney stones   . Macular hole of left eye 2005  . Migraine headache   . Retinal detachment 2005  . Sleep apnea    cpap    Past Surgical History:  Procedure Laterality Date  . HERNIA REPAIR    . KIDNEY STONE SURGERY    . LITHOTRIPSY    . RETINAL DETACHMENT SURGERY    . REVERSE SHOULDER ARTHROPLASTY Right 02/18/2016  . REVERSE SHOULDER ARTHROPLASTY Right 02/18/2016   Procedure: RIGHT REVERSE SHOULDER ARTHROPLASTY;  Surgeon: Justice Britain, MD;  Location: [place];  Service: Orthopedics;  Laterality: Right;    There were no vitals filed for this visit.  Subjective Assessment - 05/18/18 1117    Subjective  Pt. noting he will feel comfortable transitioning to home program over next few visits.      Pertinent History  Lumbar DDD & spinal stenosis    Patient Stated Goals  "to get back to where I  was"    Currently in Pain?  No/denies    Pain Score  0-No pain    Multiple Pain Sites  No                       [facility] Adult PT Treatment/Exercise - 05/18/18 1143      Self-Care   Self-Care  Other Self-Care Comments    Other Self-Care Comments   Reviewed home program to check for understanding and check for appropriateness of band resistance; Discussed pt. normal gym routine and updated HEP with machine row       Lumbar Exercises: Stretches   Passive Hamstring Stretch  Right;30 seconds;2 reps    Passive Hamstring Stretch Limitations  seated and supine     Piriformis Stretch  Right;30 seconds;2 reps    Piriformis Stretch Limitations  KTOS; seated and supine       Lumbar Exercises: Aerobic   Recumbent Bike  L2 x 6 min      Lumbar Exercises: Machines for Strengthening   Other Lumbar Machine Exercise  BATCA low row 15# x 15 reps       Lumbar Exercises: Seated   Sit to Stand  10 reps  Sit to Stand Limitations  with hip abd/ER into red looped TB at knees     Other Seated Lumbar Exercises  Seated alternating clam shell with red TB at knees x 10 reps       Lumbar Exercises: Supine   Bridge  5 seconds   x 12 reps    Bridge Limitations  + hip ABD isometric with red TB      Manual Therapy   Manual Therapy  Soft tissue mobilization;Myofascial release    Manual therapy comments  supine     Soft tissue mobilization  STM to R glute/piriformis in area of mild tenderness     Myofascial Release  Manual TPR to R piriformis; good tolerance; limited response              PT Education - 05/18/18 1202    Education provided  Yes    Education Details  HEP update    Person(s) Educated  Patient    Methods  Explanation;Demonstration;Verbal cues;Handout    Comprehension  Verbalized understanding;Returned demonstration;Verbal cues required;Need further instruction          PT Long Term Goals - 05/15/18 1454      PT LONG TERM GOAL #1   Title  Patient to be independent  with ongoing/advanced HEP    Status  Partially Met      PT LONG TERM GOAL #2   Title  Patient to improve R LE flexibility of HS, IT band and piriformis with good compliance with stretching independently    Status  Partially Met      PT LONG TERM GOAL #3   Title  Patient to improve B LE strength to >/= 4+/5 with no increase in pain     Status  Achieved      PT LONG TERM GOAL #4   Title  Patient to report ability to return to household chores & gym workouts with no increase in back/buttock pain    Status  Partially Met            Plan - 05/18/18 1118    Clinical Impression Statement  Jon Bush doing well today noting only intermittent R buttocks pain now and much improved tolerance for prolonged sitting in car.  After much discussion, notes he wishes to transition to home program following next two visits.  Notes ~ 60% improvement in pain since starting therapy.  Session focusing on manual therapy to R proximal hip musculature for improved tissue quality and gentle proximal hip strengthening for hopeful reduction in tone in R glute and improved comfort.  Ended visit with review of gym program with HEP updated with machine row.  Will plan to continue HEP review over next few visits in prep for transition to home program.      PT Treatment/Interventions  ADLs/Self Care Home Management;Cryotherapy;Electrical Stimulation;Moist Heat;Ultrasound;Neuromuscular re-education;Balance training;Therapeutic exercise;Therapeutic activities;Functional mobility training;Stair training;Gait training;Patient/family education;Manual techniques;Passive range of motion;Vasopneumatic Device;Taping;Dry needling;Iontophoresis '4mg'$ /ml Dexamethasone    Consulted and Agree with Plan of Care  Patient       Patient will benefit from skilled therapeutic intervention in order to improve the following deficits and impairments:  Pain, Increased muscle spasms, Impaired flexibility, Decreased range of motion, Decreased  strength, Decreased activity tolerance  Visit Diagnosis: Pain in right hip  Radiculopathy, lumbar region  Cramp and spasm  Muscle weakness (generalized)  Difficulty in walking, not elsewhere classified     Problem List Patient Active Problem List   Diagnosis Date Noted  . 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 Impingement syndrome of right shoulder 08/07/2014  . Pain in the chest 02/09/2014  . Shortness of breath 02/09/2014  . Gastroesophageal reflux disease without esophagitis 02/09/2014  . Other and unspecified hyperlipidemia 09/08/2013  . Hereditary and idiopathic peripheral neuropathy 07/22/2013  . Allergic rhinitis, cause unspecified 07/14/2013  . Pain in joint, pelvic region and thigh 07/14/2013  . Elevated blood pressure 05/11/2013  . Insomnia 05/11/2013  . BPH (benign prostatic hypertrophy) 05/11/2013  . Helicobacter pylori (H. pylori) 02/14/2013  . Irritable bowel syndrome 02/14/2013  . Encounter for therapeutic drug monitoring 02/09/2013  . GERD (gastroesophageal reflux disease) 02/09/2013    Bess Harvest, PTA 05/18/18 12:15 PM    [facility] [address]  [place], [place], [address] Phone: [phone]   Fax:  [phone]  Name: Jon Bush MRN: [id number] Date of Birth: Jan 03, 1946

## 2018-05-22 ENCOUNTER — Ambulatory Visit: Payer: Medicare Other | Attending: Physician Assistant | Admitting: Physical Therapy

## 2018-05-22 ENCOUNTER — Encounter: Payer: Self-pay | Admitting: Physical Therapy

## 2018-05-22 DIAGNOSIS — M5416 Radiculopathy, lumbar region: Secondary | ICD-10-CM | POA: Insufficient documentation

## 2018-05-22 DIAGNOSIS — M6281 Muscle weakness (generalized): Secondary | ICD-10-CM | POA: Diagnosis present

## 2018-05-22 DIAGNOSIS — R262 Difficulty in walking, not elsewhere classified: Secondary | ICD-10-CM | POA: Diagnosis present

## 2018-05-22 DIAGNOSIS — R252 Cramp and spasm: Secondary | ICD-10-CM | POA: Diagnosis present

## 2018-05-22 DIAGNOSIS — M25551 Pain in right hip: Secondary | ICD-10-CM | POA: Diagnosis present

## 2018-05-22 NOTE — Therapy (Signed)
[facility] [address]  [place], [place], [address] Phone: [phone]   Fax:  [phone]  Physical Therapy Treatment  Patient Details  Name: Jon Bush MRN: [id number] Date of Birth: [birth year] Referring Provider (PT): Gerrit Halls, PA-C   Encounter Date: 05/22/2018  PT End of Session - 05/22/18 1405    Visit Number  6    Number of Visits  10    Date for PT Re-Evaluation  06/08/18    Authorization Type  Medicare & BCBS    PT Start Time  1696    PT Stop Time  1443    PT Time Calculation (min)  38 min    Activity Tolerance  Patient tolerated treatment well    Behavior During Therapy  [facility] for tasks assessed/performed       Past Medical History:  Diagnosis Date  . Amaurosis fugax of left eye   . Anemia   . BPH (benign prostatic hyperplasia)   . Central retinal vein occlusion of left eye    (Left) Dr. Fenton Foy [facility]); Dr. Newell [facility])   . Enlarged prostate   . H. pylori infection 02/14/2013  . High cholesterol   . Hypertension   . IBS (irritable bowel syndrome)   . Kidney stone   . Kidney stones   . Macular hole of left eye 2005  . Migraine headache   . Retinal detachment 2005  . Sleep apnea    cpap    Past Surgical History:  Procedure Laterality Date  . HERNIA REPAIR    . KIDNEY STONE SURGERY    . LITHOTRIPSY    . RETINAL DETACHMENT SURGERY    . REVERSE SHOULDER ARTHROPLASTY Right 02/18/2016  . REVERSE SHOULDER ARTHROPLASTY Right 02/18/2016   Procedure: RIGHT REVERSE SHOULDER ARTHROPLASTY;  Surgeon: Justice Britain, MD;  Location: [place];  Service: Orthopedics;  Laterality: Right;    There were no vitals filed for this visit.  Subjective Assessment - 05/22/18 1409    Subjective  Doing well today. Remains pain free with no concerns.    Pertinent History  Lumbar DDD & spinal stenosis    Patient Stated Goals  "to get back to where I was"    Currently in Pain?  No/denies                        [facility] Adult PT Treatment/Exercise - 05/22/18 1405      Exercises   Exercises  Lumbar      Lumbar Exercises: Stretches   Passive Hamstring Stretch  Right;30 seconds;1 rep    Passive Hamstring Stretch Limitations  supine with strap    Hip Flexor Stretch  Right;30 seconds;1 rep    Hip Flexor Stretch Limitations  mod thomas with strap    ITB Stretch  Right;30 seconds;1 rep    ITB Stretch Limitations  supine with strap     Piriformis Stretch  Right;30 seconds;1 rep    Piriformis Stretch Limitations  KTOS - supine      Lumbar Exercises: Aerobic   Recumbent Bike  L2 x 6 min      Lumbar Exercises: Machines for Strengthening   Cybex Knee Extension  B LE 15# x15    Cybex Knee Flexion  B LE 15# x15      Lumbar Exercises: Supine   Clam  10 reps;5 seconds    Clam Limitations  abd bracing + alt hip ABD/ER with green TB  Bridge  10 reps;5 seconds;Non-compliant    Bridge Limitations  + hip ABD isometric with green TB      Lumbar Exercises: Sidelying   Clam  Right;10 reps;3 seconds    Clam Limitations  green TB             PT Education - 05/22/18 1444    Education provided  Yes    Education Details  HEP review - progressed therabands to green TB    Person(s) Educated  Patient    Methods  Explanation;Demonstration    Comprehension  Verbalized understanding;Returned demonstration          PT Long Term Goals - 05/15/18 1454      PT LONG TERM GOAL #1   Title  Patient to be independent with ongoing/advanced HEP    Status  Partially Met      PT LONG TERM GOAL #2   Title  Patient to improve R LE flexibility of HS, IT band and piriformis with good compliance with stretching independently    Status  Partially Met      PT LONG TERM GOAL #3   Title  Patient to improve B LE strength to >/= 4+/5 with no increase in pain     Status  Achieved      PT LONG TERM GOAL #4   Title  Patient to report ability to return to household chores & gym  workouts with no increase in back/buttock pain    Status  Partially Met            Plan - 05/22/18 1410    Clinical Impression Statement  Jon Bush reporting pain remains well controlled with no new concerns. Reviewed HEP, providing clarification as needed and progressing resistance to green TB - pt able to complete good return demonstration with only minor corrections primarily for pacing and hold times. Also reviewed proper use of relevant gym equipment if pt were to choose to use these machines in addition to his current cardio work-out. Pt on track to transition to HEP as of next visit pending final goal assessment and addressing any final pt concerns.    Rehab Potential  Good    PT Treatment/Interventions  ADLs/Self Care Home Management;Cryotherapy;Electrical Stimulation;Moist Heat;Ultrasound;Neuromuscular re-education;Balance training;Therapeutic exercise;Therapeutic activities;Functional mobility training;Stair training;Gait training;Patient/family education;Manual techniques;Passive range of motion;Vasopneumatic Device;Taping;Dry needling;Iontophoresis 48m/ml Dexamethasone    Consulted and Agree with Plan of Care  Patient       Patient will benefit from skilled therapeutic intervention in order to improve the following deficits and impairments:  Pain, Increased muscle spasms, Impaired flexibility, Decreased range of motion, Decreased strength, Decreased activity tolerance  Visit Diagnosis: Pain in right hip  Radiculopathy, lumbar region  Cramp and spasm  Muscle weakness (generalized)  Difficulty in walking, not elsewhere classified     Problem List Patient Active Problem List   Diagnosis Date Noted  . 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 Impingement syndrome of right shoulder 08/07/2014  . Pain in the chest 02/09/2014  . Shortness of breath 02/09/2014  . Gastroesophageal reflux disease without esophagitis 02/09/2014  . Other and unspecified  hyperlipidemia 09/08/2013  . Hereditary and idiopathic peripheral neuropathy 07/22/2013  . Allergic rhinitis, cause unspecified 07/14/2013  . Pain in joint, pelvic region and thigh 07/14/2013  . Elevated blood pressure 05/11/2013  . Insomnia 05/11/2013  . BPH (benign prostatic hypertrophy) 05/11/2013  . Helicobacter pylori (H. pylori) 02/14/2013  . Irritable bowel syndrome 02/14/2013  . Encounter for therapeutic drug monitoring 02/09/2013  .  GERD (gastroesophageal reflux disease) 02/09/2013    Percival Spanish, PT, MPT 05/22/2018, 2:45 PM  [facility] [address]  [place], [place], [address] Phone: [phone]   Fax:  [phone]  Name: Jon Bush MRN: [id number] Date of Birth: Dec 25, 1945

## 2018-05-25 ENCOUNTER — Ambulatory Visit: Payer: Medicare Other

## 2018-05-25 DIAGNOSIS — R252 Cramp and spasm: Secondary | ICD-10-CM

## 2018-05-25 DIAGNOSIS — M5416 Radiculopathy, lumbar region: Secondary | ICD-10-CM

## 2018-05-25 DIAGNOSIS — M25551 Pain in right hip: Secondary | ICD-10-CM

## 2018-05-25 DIAGNOSIS — M6281 Muscle weakness (generalized): Secondary | ICD-10-CM

## 2018-05-25 DIAGNOSIS — R262 Difficulty in walking, not elsewhere classified: Secondary | ICD-10-CM

## 2018-05-25 NOTE — Therapy (Addendum)
[facility] [address]  [place], [place], [address] Phone: [phone]   Fax:  [phone]  Physical Therapy Treatment  Patient Details  Name: Jon Bush MRN: [id number] Date of Birth: 09-26-1945 Referring Provider (PT): Gerrit Halls, PA-C   Encounter Date: 05/25/2018  PT End of Session - 05/25/18 1112    Visit Number  7    Number of Visits  10    Date for PT Re-Evaluation  06/08/18    Authorization Type  Medicare & BCBS    PT Start Time  1100    PT Stop Time  1140    PT Time Calculation (min)  40 min    Activity Tolerance  Patient tolerated treatment well    Behavior During Therapy  [facility] for tasks assessed/performed       Past Medical History:  Diagnosis Date  . Amaurosis fugax of left eye   . Anemia   . BPH (benign prostatic hyperplasia)   . Central retinal vein occlusion of left eye    (Left) Dr. Fenton Foy [facility]); Dr. Newell [facility])   . Enlarged prostate   . H. pylori infection 02/14/2013  . High cholesterol   . Hypertension   . IBS (irritable bowel syndrome)   . Kidney stone   . Kidney stones   . Macular hole of left eye 2005  . Migraine headache   . Retinal detachment 2005  . Sleep apnea    cpap    Past Surgical History:  Procedure Laterality Date  . HERNIA REPAIR    . KIDNEY STONE SURGERY    . LITHOTRIPSY    . RETINAL DETACHMENT SURGERY    . REVERSE SHOULDER ARTHROPLASTY Right 02/18/2016  . REVERSE SHOULDER ARTHROPLASTY Right 02/18/2016   Procedure: RIGHT REVERSE SHOULDER ARTHROPLASTY;  Surgeon: Justice Britain, MD;  Location: [place];  Service: Orthopedics;  Laterality: Right;    There were no vitals filed for this visit.  Subjective Assessment - 05/25/18 1111    Subjective  Doing well today. Remains pain free with no concerns and pt. feels he is comfortable transitioning to home program after today.      Pertinent History  Lumbar DDD & spinal stenosis    Patient  Stated Goals  "to get back to where I was"    Currently in Pain?  No/denies    Pain Score  0-No pain    Multiple Pain Sites  No         [facility] PT Assessment - 05/25/18 0001      Observation/Other Assessments   Focus on Therapeutic Outcomes (FOTO)   75% (25% limitation)                   [facility] Adult PT Treatment/Exercise - 05/25/18 1154      Self-Care   Self-Care  Other Self-Care Comments    Other Self-Care Comments   Final comprehensive HEP review with update and addition of machine strengthening       Lumbar Exercises: Stretches   Passive Hamstring Stretch  Right;30 seconds;2 reps   Pt. able to recall and perform appropriately with only minVC   Passive Hamstring Stretch Limitations  supine with strap    Hip Flexor Stretch  Right;30 seconds;2 reps   Pt. able to recall and perform appropriately with only minVC   Hip Flexor Stretch Limitations  mod thomas with strap    ITB Stretch  Right;30 seconds;2 reps   Pt. able to  recall and perform appropriately with only minVC   ITB Stretch Limitations  supine with strap     Piriformis Stretch  Right;30 seconds;2 reps   Pt. able to recall and perform appropriately with only minVC   Piriformis Stretch Limitations  KTOS - supine      Lumbar Exercises: Aerobic   Recumbent Bike  L2 x 6 min    Nustep  Lvl 5, 2 min    reviewed for performance at local gym      Lumbar Exercises: Machines for Strengthening   Cybex Knee Flexion  B LE 20# x15   Reviewed proper setup of machine for use at gym            PT Education - 05/25/18 1152    Education provided  Yes    Education Details  HEP update     Person(s) Educated  Patient    Methods  Explanation;Demonstration;Verbal cues;Handout    Comprehension  Verbalized understanding;Returned demonstration;Verbal cues required;Need further instruction          PT Long Term Goals - 05/25/18 1112      PT LONG TERM GOAL #1   Title  Patient to be independent with ongoing/advanced  HEP    Status  Achieved      PT LONG TERM GOAL #2   Title  Patient to improve R LE flexibility of HS, IT band and piriformis with good compliance with stretching independently    Status  Achieved      PT LONG TERM GOAL #3   Title  Patient to improve B LE strength to >/= 4+/5 with no increase in pain     Status  Achieved      PT LONG TERM GOAL #4   Title  Patient to report ability to return to household chores & gym workouts with no increase in back/buttock pain    Status  Achieved            Plan - 05/25/18 1117    Clinical Impression Statement  Jon Bush doing well today noting he feels comfortable transitioning to home program following today's visit.  Supervising PT approving d/c following today as pt. has now achieved all LTG's in POC.  Session focusing on final HEP review and update with machine strengthening.  Pt. verbalized and able to demo understanding of machine strengthening activities and appropriate LE stretching today with only min cueing.  Pt. now d/c from therapy.      PT Treatment/Interventions  ADLs/Self Care Home Management;Cryotherapy;Electrical Stimulation;Moist Heat;Ultrasound;Neuromuscular re-education;Balance training;Therapeutic exercise;Therapeutic activities;Functional mobility training;Stair training;Gait training;Patient/family education;Manual techniques;Passive range of motion;Vasopneumatic Device;Taping;Dry needling;Iontophoresis 29m/ml Dexamethasone    PT Next Visit Plan  d/c    Consulted and Agree with Plan of Care  Patient       Patient will benefit from skilled therapeutic intervention in order to improve the following deficits and impairments:  Pain, Increased muscle spasms, Impaired flexibility, Decreased range of motion, Decreased strength, Decreased activity tolerance  Visit Diagnosis: Pain in right hip  Radiculopathy, lumbar region  Cramp and spasm  Muscle weakness (generalized)  Difficulty in walking, not elsewhere  classified     Problem List Patient Active Problem List   Diagnosis Date Noted  . 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 Impingement syndrome of right shoulder 08/07/2014  . Pain in the chest 02/09/2014  . Shortness of breath 02/09/2014  . Gastroesophageal reflux disease without esophagitis 02/09/2014  . Other and unspecified hyperlipidemia 09/08/2013  . Hereditary and idiopathic peripheral  neuropathy 07/22/2013  . Allergic rhinitis, cause unspecified 07/14/2013  . Pain in joint, pelvic region and thigh 07/14/2013  . Elevated blood pressure 05/11/2013  . Insomnia 05/11/2013  . BPH (benign prostatic hypertrophy) 05/11/2013  . Helicobacter pylori (H. pylori) 02/14/2013  . Irritable bowel syndrome 02/14/2013  . Encounter for therapeutic drug monitoring 02/09/2013  . GERD (gastroesophageal reflux disease) 02/09/2013    Bess Harvest, PTA 05/25/18 12:56 PM  [facility] [address]  [place], [place], [address] Phone: [phone]   Fax:  [phone]  Name: Jon Bush MRN: [id number] Date of Birth: Jan 17, 1946   PHYSICAL THERAPY DISCHARGE SUMMARY  Visits from Start of Care: 7  Current functional level related to goals / functional outcomes:   Refer to above clinical impression.   Remaining deficits:   As above.   Education / Equipment:   HEP  Plan: Patient agrees to discharge.  Patient goals were met. Patient is being discharged due to meeting the stated rehab goals.  ?????     Percival Spanish, PT, MPT 05/25/18, 12:56 PM  [facility] [address]  [place], [place], [address] Phone: [phone]   Fax:  [phone]

## 2018-05-29 ENCOUNTER — Ambulatory Visit: Payer: Medicare Other | Admitting: Physical Therapy

## 2018-06-01 ENCOUNTER — Ambulatory Visit: Payer: Medicare Other | Admitting: Physical Therapy

## 2018-06-05 ENCOUNTER — Ambulatory Visit: Payer: Medicare Other

## 2018-06-08 ENCOUNTER — Ambulatory Visit: Payer: Medicare Other | Admitting: Physical Therapy

## 2019-03-09 ENCOUNTER — Emergency Department (HOSPITAL_BASED_OUTPATIENT_CLINIC_OR_DEPARTMENT_OTHER)
Admission: EM | Admit: 2019-03-09 | Discharge: 2019-03-09 | Disposition: A | Discharge status: Home / Self Care | Payer: Medicare Other | Attending: Emergency Medicine | Admitting: Emergency Medicine

## 2019-03-09 ENCOUNTER — Other Ambulatory Visit: Payer: Self-pay

## 2019-03-09 DIAGNOSIS — K5641 Fecal impaction: Secondary | ICD-10-CM

## 2019-03-09 DIAGNOSIS — I10 Essential (primary) hypertension: Secondary | ICD-10-CM | POA: Diagnosis not present

## 2019-03-09 DIAGNOSIS — Z79899 Other long term (current) drug therapy: Secondary | ICD-10-CM | POA: Insufficient documentation

## 2019-03-09 DIAGNOSIS — K5900 Constipation, unspecified: Secondary | ICD-10-CM | POA: Diagnosis present

## 2019-03-09 DIAGNOSIS — K602 Anal fissure, unspecified: Secondary | ICD-10-CM | POA: Diagnosis not present

## 2019-03-09 DIAGNOSIS — Z7982 Long term (current) use of aspirin: Secondary | ICD-10-CM | POA: Insufficient documentation

## 2019-03-09 DIAGNOSIS — K6289 Other specified diseases of anus and rectum: Secondary | ICD-10-CM

## 2019-03-09 MED ORDER — FLEET ENEMA 7-19 GM/118ML RE ENEM
1.0000 | ENEMA | Freq: Once | RECTAL | Status: AC
  Administered 2019-03-09: 1 via RECTAL

## 2019-03-09 MED ORDER — FLEET ENEMA 7-19 GM/118ML RE ENEM
ENEMA | RECTAL | Status: AC
  Filled 2019-03-09: qty 1

## 2019-03-09 MED ORDER — LIDOCAINE HCL URETHRAL/MUCOSAL 2 % EX GEL
CUTANEOUS | Status: AC
  Administered 2019-03-09: 20
  Filled 2019-03-09: qty 20

## 2019-03-09 NOTE — ED Provider Notes (Signed)
[facility] EMERGENCY DEPARTMENT Provider Note   CSN: [id number] Arrival date & time: 03/09/19  1946    History   Chief Complaint Chief Complaint  Patient presents with  . Fecal Impaction    HPI Jon Bush is a 73 y.o. male.     HPI   73yo male with history of htn, hlpd presents with concern for rectal pain and constipation. Reports he has not been able to have a bowel movement in several days. No nausea, no vomiting, no fevers and no abdominal pain. Has been tolerating po. Is passing flatus. Feels like he needs to have a BM but cannot. Tried a suppository without relief. Now having severe pain in the rectum. No bleeding.  Past Medical History:  Diagnosis Date  . Amaurosis fugax of left eye   . Anemia   . BPH (benign prostatic hyperplasia)   . Central retinal vein occlusion of left eye    (Left) Dr. Guy Beginobert DeVanzo [facility] Dr. Memory Argueraig Grevin [facility])   . Enlarged prostate   . H. pylori infection 02/14/2013  . High cholesterol   . Hypertension   . IBS (irritable bowel syndrome)   . Kidney stone   . Kidney stones   . Macular hole of left eye 2005  . Migraine headache   . Retinal detachment 2005  . Sleep apnea    cpap    Patient Active Problem List   Diagnosis Date Noted  . 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 Impingement syndrome of right shoulder 08/07/2014  . Pain in the chest 02/09/2014  . Shortness of breath 02/09/2014  . Gastroesophageal reflux disease without esophagitis 02/09/2014  . Other and unspecified hyperlipidemia 09/08/2013  . Hereditary and idiopathic peripheral neuropathy 07/22/2013  . Allergic rhinitis, cause unspecified 07/14/2013  . Pain in joint, pelvic region and thigh 07/14/2013  . Elevated blood pressure 05/11/2013  . Insomnia 05/11/2013  . BPH (benign prostatic hypertrophy) 05/11/2013  . Helicobacter pylori (H. pylori) 02/14/2013  . Irritable bowel syndrome 02/14/2013  . Encounter for therapeutic  drug monitoring 02/09/2013  . GERD (gastroesophageal reflux disease) 02/09/2013    Past Surgical History:  Procedure Laterality Date  . HERNIA REPAIR    . KIDNEY STONE SURGERY    . LITHOTRIPSY    . RETINAL DETACHMENT SURGERY    . REVERSE SHOULDER ARTHROPLASTY Right 02/18/2016  . REVERSE SHOULDER ARTHROPLASTY Right 02/18/2016   Procedure: RIGHT REVERSE SHOULDER ARTHROPLASTY;  Surgeon: Francena HanlyKevin Supple, MD;  Location: [facility] OR;  Service: Orthopedics;  Laterality: Right;        Home Medications    Prior to Admission medications   Medication Sig Start Date End Date Taking? Authorizing Provider  amoxicillin (AMOXIL) 500 MG capsule Take 2,000 mg by mouth See admin instructions. Take before dental procedures 05/22/13   [provider]  aspirin 81 MG tablet Take 81 mg by mouth daily.    [provider]  Calcium Citrate-Vitamin D (CALCIUM + D PO) Take 1 tablet by mouth daily.    [provider]  diazepam (VALIUM) 5 MG tablet Take 0.5-1 tablets (2.5-5 mg total) by mouth every 6 (six) hours as needed for muscle spasms or sedation. Patient not taking: Reported on 08/31/2016 02/19/16   Shuford, French Anaracy, PA-C  diclofenac sodium (VOLTAREN) 1 % GEL Apply 1 application topically 3 (three) times daily as needed.     [provider]  fenofibrate 160 MG tablet Take 160 mg by mouth at bedtime.    [provider]  ferrous sulfate 325 (65 FE) MG tablet Take 325 mg by mouth daily with breakfast.    [provider]  fexofenadine (ALLEGRA) 60 MG tablet Take 60 mg by mouth as needed for rhinitis (sinus congestion).    [provider]  folic acid (FOLVITE) 542 MCG tablet Take 400 mcg by mouth daily.    [provider]  gabapentin (NEURONTIN) 100 MG capsule Take 100 mg by mouth at bedtime.    [provider]  HYDROmorphone (DILAUDID) 2 MG tablet Take 1-2 tablets (2-4 mg total) by mouth every 4 (four) hours as needed (for severe pain not  controlled by percocet). Patient not taking: Reported on 08/31/2016 02/19/16   Shuford, Olivia Mackie, PA-C  hydrOXYzine (ATARAX/VISTARIL) 25 MG tablet Take 1 tablet (25 mg total) by mouth every 4 (four) hours as needed for itching. Patient not taking: Reported on 05/02/2018 10/11/13   Jonathon Resides, MD  loperamide (IMODIUM A-D) 2 MG capsule Take 2 mg by mouth as needed for diarrhea or loose stools (for irritable colon problems).    [provider]  Melatonin 3 MG CAPS Take 3 capsules by mouth at bedtime.    [provider]  mometasone (NASONEX) 50 MCG/ACT nasal spray Place 2 sprays into the nose daily.    [provider]  Multiple Vitamins-Minerals (MULTIVITAMIN MEN PO) Take 1 tablet by mouth daily.    [provider]  ondansetron (ZOFRAN ODT) 4 MG disintegrating tablet Take 1 tablet (4 mg total) by mouth every 8 (eight) hours as needed for nausea or vomiting. Patient not taking: Reported on 05/02/2018 10/15/16   Gareth Morgan, MD  oxyCODONE-acetaminophen (PERCOCET) 5-325 MG tablet Take 1-2 tablets by mouth every 4 (four) hours as needed. Patient not taking: Reported on 08/31/2016 02/19/16   Shuford, Olivia Mackie, PA-C  pantoprazole (PROTONIX) 40 MG tablet Take 40 mg by mouth 2 (two) times daily before a meal.     [provider]  potassium citrate (UROCIT-K) 10 MEQ (1080 MG) SR tablet Take 20 mEq by mouth 2 (two) times daily.    [provider]  pravastatin (PRAVACHOL) 80 MG tablet Take 1 tablet (80 mg total) by mouth at bedtime. 06/27/13 10/15/16  Jonathon Resides, MD  pseudoephedrine (SUDAFED) 30 MG tablet Take 30 mg by mouth every 4 (four) hours as needed for congestion.    [provider]  ranitidine (ZANTAC) 150 MG capsule Take 150 mg by mouth at bedtime as needed for heartburn.    [provider]  telmisartan-hydrochlorothiazide (MICARDIS HCT) 40-12.5 MG tablet Take 1 tablet by mouth every morning. 02/01/16   [provider]   Triamcinolone Acetonide (NASACORT ALLERGY 24HR NA) Place 2 sprays into the nose daily.    [provider]  vitamin C (ASCORBIC ACID) 500 MG tablet Take 500 mg by mouth daily.    [provider]    Family History Family History  Problem Relation Age of Onset  . Heart disease Mother   . Hypertension Mother   . Hyperlipidemia Mother   . Heart attack Mother   . Heart disease Father   . Alzheimer's disease Father   . Diabetes Neg Hx     Social History Social History   Tobacco Use  . Smoking status: Never Smoker  . Smokeless tobacco: Never Used  Substance Use Topics  . Alcohol use: Yes    Alcohol/week: 0.0 standard drinks    Comment: Rarely  . Drug use: No     Allergies  Codeine   Review of Systems Review of Systems  Constitutional: Negative for fever.  Eyes: Negative for visual disturbance.  Respiratory: Negative for shortness of breath.   Cardiovascular: Negative for chest pain.  Gastrointestinal: Positive for constipation. Negative for abdominal pain, diarrhea, nausea and vomiting.  Genitourinary: Negative for difficulty urinating and dysuria.  Musculoskeletal: Negative for back pain.  Skin: Negative for rash.  Neurological: Negative for headaches.     Physical Exam Updated Vital Signs BP 129/80 (BP Location: Right Arm)   Pulse 90   Temp 97.7 F (36.5 C) (Oral)   Resp 16   Ht 5\' 9"  (1.753 m)   Wt 83.9 kg   SpO2 100%   BMI 27.32 kg/m   Physical Exam Vitals signs and nursing note reviewed.  Constitutional:      General: He is not in acute distress.    Appearance: He is well-developed. He is not diaphoretic.  HENT:     Head: Normocephalic and atraumatic.  Eyes:     Conjunctiva/sclera: Conjunctivae normal.  Neck:     Musculoskeletal: Normal range of motion.  Cardiovascular:     Rate and Rhythm: Normal rate and regular rhythm.  Pulmonary:     Effort: Pulmonary effort is normal. No respiratory distress.  Abdominal:     General:  There is no distension.     Palpations: Abdomen is soft.     Tenderness: There is no abdominal tenderness. There is no guarding.  Genitourinary:    Rectum: Tenderness present.     Comments: Impacted stool in rectum Skin:    General: Skin is warm and dry.  Neurological:     Mental Status: He is alert and oriented to person, place, and time.      ED Treatments / Results  Labs (all labs ordered are listed, but only abnormal results are displayed) Labs Reviewed - No data to display  EKG None  Radiology No results found.  Procedures Fecal disimpaction  Date/Time: 03/10/2019 1:08 PM Performed by: Alvira MondaySchlossman, Torrence Branagan, MD Authorized by: Alvira MondaySchlossman, Merinda Victorino, MD  Consent: Verbal consent obtained. Risks and benefits: risks, benefits and alternatives were discussed Consent given by: patient Required items: required blood products, implants, devices, and special equipment available Patient identity confirmed: verbally with patient Time out: Immediately prior to procedure a "time out" was called to verify the correct patient, procedure, equipment, support staff and site/side marked as required. Local anesthesia used: yes  Anesthesia: Local anesthesia used: yes Local Anesthetic: topical anesthetic  Sedation: Patient sedated: no  Patient tolerance: patient tolerated the procedure well with no immediate complications    (including critical care time)  Medications Ordered in ED Medications  lidocaine (XYLOCAINE) 2 % jelly (20 application  Given by Other 03/09/19 2053)  sodium phosphate (FLEET) 7-19 GM/118ML enema 1 enema ( Rectal Not Given 03/09/19 2156)     Initial Impression / Assessment and Plan / ED Course  I have reviewed the triage vital signs and the nursing notes.  Pertinent labs & imaging results that were available during my care of the patient were reviewed by me and considered in my medical decision making (see chart for details).        73yo male with history of  htn, hlpd presents with concern for rectal pain and constipation.  Patient without abdominal pain, no distention, no tenderness, is passing flatus, tolerating po without nausea or vomiting and doubt obstruction, diverticulitis, or other acute intraabdominal pathology.  On exam has rectal pain and impacted stool. Disimpaction performed,  able to break up stool and remove some but more stool present.  Enema given and patient able to have large BM after enema.  Reports feeling significantly improved following procedure.  Given severe rectal pain, suspect likely anal fissure as well although not visualized on exam, no hx of bleeding.  Discussed importance of hydration, activity, diet and recommended miralax and colace. Patient discharged in stable condition with understanding of reasons to return.    Final Clinical Impressions(s) / ED Diagnoses   Final diagnoses:  Constipation, unspecified constipation type  Rectal pain  Impacted stool in rectum [facility])  Anal fissure    ED Discharge Orders    None       Alvira MondaySchlossman, Derrion Tritz, MD 03/10/19 1312

## 2019-03-09 NOTE — ED Triage Notes (Signed)
PResents with rectal pain and fecal im paction that began yesterday

## 2019-03-09 NOTE — ED Notes (Signed)
Pt back from [place], States he was unable to have a BM

## 2019-03-09 NOTE — Discharge Instructions (Signed)
For your constipation, I recommend hydration, active lifestyle, high fiber diet, colace twice daily and miralax.  Follow up with your PCP.

## 2019-03-09 NOTE — ED Notes (Signed)
Pt walked to BR with stand by assist. States he has not had a good BM "in a while". C/o rectal pain since 6 pm

## 2019-03-09 NOTE — ED Notes (Signed)
Pt d/c home, ambulated to window without difficulty. States he "feels much better" and has no pain at this time

## 2019-03-09 NOTE — ED Notes (Signed)
ED Provider at bedside. (Dr. Billy Fischer) for disimpaction of stool with Ayanah Snader, RN as chaperone

## 2019-09-13 ENCOUNTER — Ambulatory Visit: Payer: Medicare Other | Attending: Internal Medicine

## 2019-09-13 DIAGNOSIS — Z23 Encounter for immunization: Secondary | ICD-10-CM | POA: Insufficient documentation

## 2019-09-13 NOTE — Progress Notes (Signed)
   Covid-19 Vaccination Clinic  Name:  [name]    MRN: [id number] DOB: [birth year]  09/13/2019  [name] was observed post Covid-19 immunization for 15 minutes without incidence. He was provided with Vaccine Information Sheet and instruction to access the V-Safe system.   [name] was instructed to call 911 with any severe reactions post vaccine: Marland Kitchen Difficulty breathing  . Swelling of your face and throat  . A fast heartbeat  . A bad rash all over your body  . Dizziness and weakness    Immunizations Administered    Name Date Dose VIS Date Route   Pfizer COVID-19 Vaccine 09/13/2019  9:38 AM 0.3 mL 08/02/2019 Intramuscular   Manufacturer: [employer], [employer]   Lot: [id number]   NDC: [address]-6

## 2019-10-04 ENCOUNTER — Ambulatory Visit: Payer: Medicare Other | Attending: Internal Medicine

## 2019-10-04 DIAGNOSIS — Z23 Encounter for immunization: Secondary | ICD-10-CM

## 2019-10-04 NOTE — Progress Notes (Signed)
   Covid-19 Vaccination Clinic  Name:  Jon Bush    MRN: [id number] DOB: May 01, 1946  10/04/2019  [name] was observed post Covid-19 immunization for 15 minutes without incidence. He was provided with Vaccine Information Sheet and instruction to access the V-Safe system.   [name] was instructed to call 911 with any severe reactions post vaccine: Marland Kitchen Difficulty breathing  . Swelling of your face and throat  . A fast heartbeat  . A bad rash all over your body  . Dizziness and weakness    Immunizations Administered    Name Date Dose VIS Date Route   Pfizer COVID-19 Vaccine 10/04/2019 10:33 AM 0.3 mL 08/02/2019 Intramuscular   Manufacturer: [employer], [employer]   Lot: [id number]   NDC: [address]-9

## 2019-10-10 ENCOUNTER — Ambulatory Visit: Payer: Medicare Other

## 2020-03-03 ENCOUNTER — Emergency Department (HOSPITAL_COMMUNITY): Payer: Medicare Other

## 2020-03-03 ENCOUNTER — Inpatient Hospital Stay (HOSPITAL_COMMUNITY)
Admission: EM | Admit: 2020-03-03 | Discharge: 2020-03-05 | DRG: 184 | Disposition: A | Discharge status: Home / Self Care | Payer: Medicare Other | Attending: General Surgery | Admitting: General Surgery

## 2020-03-03 ENCOUNTER — Other Ambulatory Visit: Payer: Self-pay

## 2020-03-03 ENCOUNTER — Encounter (HOSPITAL_COMMUNITY): Payer: Self-pay

## 2020-03-03 DIAGNOSIS — S2243XA Multiple fractures of ribs, bilateral, initial encounter for closed fracture: Principal | ICD-10-CM | POA: Diagnosis present

## 2020-03-03 DIAGNOSIS — S2222XA Fracture of body of sternum, initial encounter for closed fracture: Secondary | ICD-10-CM | POA: Diagnosis present

## 2020-03-03 DIAGNOSIS — R0789 Other chest pain: Secondary | ICD-10-CM | POA: Diagnosis not present

## 2020-03-03 DIAGNOSIS — Z820 Family history of epilepsy and other diseases of the nervous system: Secondary | ICD-10-CM

## 2020-03-03 DIAGNOSIS — S2241XA Multiple fractures of ribs, right side, initial encounter for closed fracture: Secondary | ICD-10-CM

## 2020-03-03 DIAGNOSIS — J309 Allergic rhinitis, unspecified: Secondary | ICD-10-CM | POA: Diagnosis present

## 2020-03-03 DIAGNOSIS — Z79899 Other long term (current) drug therapy: Secondary | ICD-10-CM

## 2020-03-03 DIAGNOSIS — Z96611 Presence of right artificial shoulder joint: Secondary | ICD-10-CM | POA: Diagnosis present

## 2020-03-03 DIAGNOSIS — E7800 Pure hypercholesterolemia, unspecified: Secondary | ICD-10-CM | POA: Diagnosis present

## 2020-03-03 DIAGNOSIS — Z20822 Contact with and (suspected) exposure to covid-19: Secondary | ICD-10-CM | POA: Diagnosis present

## 2020-03-03 DIAGNOSIS — Y92410 Unspecified street and highway as the place of occurrence of the external cause: Secondary | ICD-10-CM

## 2020-03-03 DIAGNOSIS — S2220XA Unspecified fracture of sternum, initial encounter for closed fracture: Secondary | ICD-10-CM

## 2020-03-03 DIAGNOSIS — Z885 Allergy status to narcotic agent status: Secondary | ICD-10-CM

## 2020-03-03 DIAGNOSIS — N400 Enlarged prostate without lower urinary tract symptoms: Secondary | ICD-10-CM | POA: Diagnosis present

## 2020-03-03 DIAGNOSIS — K219 Gastro-esophageal reflux disease without esophagitis: Secondary | ICD-10-CM | POA: Diagnosis present

## 2020-03-03 DIAGNOSIS — S2249XA Multiple fractures of ribs, unspecified side, initial encounter for closed fracture: Secondary | ICD-10-CM

## 2020-03-03 DIAGNOSIS — I10 Essential (primary) hypertension: Secondary | ICD-10-CM | POA: Diagnosis present

## 2020-03-03 DIAGNOSIS — G4733 Obstructive sleep apnea (adult) (pediatric): Secondary | ICD-10-CM | POA: Diagnosis present

## 2020-03-03 DIAGNOSIS — Z7982 Long term (current) use of aspirin: Secondary | ICD-10-CM

## 2020-03-03 DIAGNOSIS — Z8249 Family history of ischemic heart disease and other diseases of the circulatory system: Secondary | ICD-10-CM

## 2020-03-03 DIAGNOSIS — Z83438 Family history of other disorder of lipoprotein metabolism and other lipidemia: Secondary | ICD-10-CM

## 2020-03-03 DIAGNOSIS — Z87442 Personal history of urinary calculi: Secondary | ICD-10-CM

## 2020-03-03 DIAGNOSIS — E785 Hyperlipidemia, unspecified: Secondary | ICD-10-CM | POA: Diagnosis present

## 2020-03-03 LAB — BASIC METABOLIC PANEL
Anion gap: 14 (ref 5–15)
BUN: 33 mg/dL — ABNORMAL HIGH (ref 8–23)
CO2: 24 mmol/L (ref 22–32)
Calcium: 10.4 mg/dL — ABNORMAL HIGH (ref 8.9–10.3)
Chloride: 100 mmol/L (ref 98–111)
Creatinine, Ser: 1.54 mg/dL — ABNORMAL HIGH (ref 0.61–1.24)
GFR calc Af Amer: 50 mL/min — ABNORMAL LOW (ref >60)
GFR calc non Af Amer: 43 mL/min — ABNORMAL LOW (ref >60)
Glucose, Bld: 115 mg/dL — ABNORMAL HIGH (ref 70–99)
Potassium: 4.3 mmol/L (ref 3.5–5.1)
Sodium: 138 mmol/L (ref 135–145)

## 2020-03-03 LAB — CBC
HCT: 42.4 % (ref 39.0–52.0)
Hemoglobin: 14.4 g/dL (ref 13.0–17.0)
MCH: 32.8 pg (ref 26.0–34.0)
MCHC: 34 g/dL (ref 30.0–36.0)
MCV: 96.6 fL (ref 80.0–100.0)
Platelets: 286 10*3/uL (ref 150–400)
RBC: 4.39 MIL/uL (ref 4.22–5.81)
RDW: 12.4 % (ref 11.5–15.5)
WBC: 15.4 10*3/uL — ABNORMAL HIGH (ref 4.0–10.5)
nRBC: 0 % (ref 0.0–0.2)

## 2020-03-03 LAB — TROPONIN I (HIGH SENSITIVITY)
Troponin I (High Sensitivity): 21 ng/L — ABNORMAL HIGH (ref <18)
Troponin I (High Sensitivity): 20 ng/L — ABNORMAL HIGH (ref <18)

## 2020-03-03 MED ORDER — SODIUM CHLORIDE 0.9% FLUSH
3.0000 mL | Freq: Once | INTRAVENOUS | Status: AC
  Administered 2020-03-03: 3 mL via INTRAVENOUS

## 2020-03-03 MED ORDER — IOHEXOL 300 MG/ML  SOLN
75.0000 mL | Freq: Once | INTRAMUSCULAR | Status: AC | PRN
  Administered 2020-03-03: 75 mL via INTRAVENOUS

## 2020-03-03 MED ORDER — SODIUM CHLORIDE (PF) 0.9 % IJ SOLN
INTRAMUSCULAR | Status: AC
  Administered 2020-03-03: 3 mL via INTRAVENOUS
  Filled 2020-03-03: qty 50

## 2020-03-03 MED ORDER — FENTANYL CITRATE (PF) 100 MCG/2ML IJ SOLN
50.0000 ug | Freq: Once | INTRAMUSCULAR | Status: AC
  Administered 2020-03-03: 50 ug via INTRAVENOUS
  Filled 2020-03-03: qty 2

## 2020-03-03 MED ORDER — ONDANSETRON HCL 4 MG/2ML IJ SOLN
4.0000 mg | Freq: Once | INTRAMUSCULAR | Status: AC
  Administered 2020-03-03: 4 mg via INTRAVENOUS
  Filled 2020-03-03: qty 2
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
## 2020-03-03 NOTE — ED Notes (Signed)
Patient transported to CT 

## 2020-03-04 ENCOUNTER — Encounter (HOSPITAL_COMMUNITY): Payer: Self-pay

## 2020-03-04 ENCOUNTER — Inpatient Hospital Stay (HOSPITAL_COMMUNITY): Payer: Medicare Other

## 2020-03-04 ENCOUNTER — Emergency Department (HOSPITAL_COMMUNITY): Payer: Medicare Other

## 2020-03-04 DIAGNOSIS — E785 Hyperlipidemia, unspecified: Secondary | ICD-10-CM | POA: Diagnosis not present

## 2020-03-04 DIAGNOSIS — K219 Gastro-esophageal reflux disease without esophagitis: Secondary | ICD-10-CM | POA: Diagnosis present

## 2020-03-04 DIAGNOSIS — I10 Essential (primary) hypertension: Secondary | ICD-10-CM | POA: Diagnosis not present

## 2020-03-04 DIAGNOSIS — Z83438 Family history of other disorder of lipoprotein metabolism and other lipidemia: Secondary | ICD-10-CM | POA: Diagnosis not present

## 2020-03-04 DIAGNOSIS — S2243XA Multiple fractures of ribs, bilateral, initial encounter for closed fracture: Secondary | ICD-10-CM | POA: Diagnosis present

## 2020-03-04 DIAGNOSIS — Z7982 Long term (current) use of aspirin: Secondary | ICD-10-CM | POA: Diagnosis not present

## 2020-03-04 DIAGNOSIS — E7800 Pure hypercholesterolemia, unspecified: Secondary | ICD-10-CM | POA: Diagnosis not present

## 2020-03-04 DIAGNOSIS — Z96611 Presence of right artificial shoulder joint: Secondary | ICD-10-CM | POA: Diagnosis not present

## 2020-03-04 DIAGNOSIS — G4733 Obstructive sleep apnea (adult) (pediatric): Secondary | ICD-10-CM | POA: Diagnosis present

## 2020-03-04 DIAGNOSIS — J309 Allergic rhinitis, unspecified: Secondary | ICD-10-CM | POA: Diagnosis not present

## 2020-03-04 DIAGNOSIS — R0789 Other chest pain: Secondary | ICD-10-CM | POA: Diagnosis present

## 2020-03-04 DIAGNOSIS — Z8249 Family history of ischemic heart disease and other diseases of the circulatory system: Secondary | ICD-10-CM | POA: Diagnosis not present

## 2020-03-04 DIAGNOSIS — S2222XA Fracture of body of sternum, initial encounter for closed fracture: Secondary | ICD-10-CM | POA: Diagnosis present

## 2020-03-04 DIAGNOSIS — Z87442 Personal history of urinary calculi: Secondary | ICD-10-CM | POA: Diagnosis not present

## 2020-03-04 DIAGNOSIS — Z20822 Contact with and (suspected) exposure to covid-19: Secondary | ICD-10-CM | POA: Diagnosis present

## 2020-03-04 DIAGNOSIS — Z885 Allergy status to narcotic agent status: Secondary | ICD-10-CM | POA: Diagnosis not present

## 2020-03-04 DIAGNOSIS — N400 Enlarged prostate without lower urinary tract symptoms: Secondary | ICD-10-CM | POA: Diagnosis present

## 2020-03-04 DIAGNOSIS — Z820 Family history of epilepsy and other diseases of the nervous system: Secondary | ICD-10-CM | POA: Diagnosis not present

## 2020-03-04 DIAGNOSIS — Z79899 Other long term (current) drug therapy: Secondary | ICD-10-CM | POA: Diagnosis not present

## 2020-03-04 DIAGNOSIS — Y92410 Unspecified street and highway as the place of occurrence of the external cause: Secondary | ICD-10-CM | POA: Diagnosis not present

## 2020-03-04 LAB — BASIC METABOLIC PANEL
Anion gap: 7 (ref 5–15)
BUN: 35 mg/dL — ABNORMAL HIGH (ref 8–23)
CO2: 24 mmol/L (ref 22–32)
Calcium: 8.8 mg/dL — ABNORMAL LOW (ref 8.9–10.3)
Chloride: 103 mmol/L (ref 98–111)
Creatinine, Ser: 1.34 mg/dL — ABNORMAL HIGH (ref 0.61–1.24)
GFR calc Af Amer: 59 mL/min — ABNORMAL LOW (ref >60)
GFR calc non Af Amer: 51 mL/min — ABNORMAL LOW (ref >60)
Glucose, Bld: 115 mg/dL — ABNORMAL HIGH (ref 70–99)
Potassium: 4.3 mmol/L (ref 3.5–5.1)
Sodium: 134 mmol/L — ABNORMAL LOW (ref 135–145)

## 2020-03-04 LAB — CBC
HCT: 37.5 % — ABNORMAL LOW (ref 39.0–52.0)
Hemoglobin: 13 g/dL (ref 13.0–17.0)
MCH: 34.1 pg — ABNORMAL HIGH (ref 26.0–34.0)
MCHC: 34.7 g/dL (ref 30.0–36.0)
MCV: 98.4 fL (ref 80.0–100.0)
Platelets: 270 10*3/uL (ref 150–400)
RBC: 3.81 MIL/uL — ABNORMAL LOW (ref 4.22–5.81)
RDW: 12.8 % (ref 11.5–15.5)
WBC: 10.8 10*3/uL — ABNORMAL HIGH (ref 4.0–10.5)
nRBC: 0 % (ref 0.0–0.2)

## 2020-03-04 LAB — SARS CORONAVIRUS 2 BY RT PCR (HOSPITAL ORDER, PERFORMED IN ~~LOC~~ HOSPITAL LAB): SARS Coronavirus 2: NEGATIVE

## 2020-03-04 MED ORDER — FENOFIBRATE 160 MG PO TABS
160.0000 mg | ORAL_TABLET | Freq: Every day | ORAL | Status: DC
  Filled 2020-03-04 (×2): qty 1

## 2020-03-04 MED ORDER — IRBESARTAN 150 MG PO TABS
150.0000 mg | ORAL_TABLET | Freq: Every day | ORAL | Status: DC
  Administered 2020-03-04 – 2020-03-05 (×2): 150 mg via ORAL
  Filled 2020-03-04 (×2): qty 1

## 2020-03-04 MED ORDER — ACETAMINOPHEN 500 MG PO TABS
1000.0000 mg | ORAL_TABLET | Freq: Four times a day (QID) | ORAL | Status: DC
  Administered 2020-03-04 – 2020-03-05 (×6): 1000 mg via ORAL
  Filled 2020-03-04 (×7): qty 2

## 2020-03-04 MED ORDER — HYDROMORPHONE HCL 1 MG/ML IJ SOLN
0.5000 mg | INTRAMUSCULAR | Status: DC | PRN
  Administered 2020-03-04: 0.5 mg via INTRAVENOUS
  Filled 2020-03-04 (×2): qty 1

## 2020-03-04 MED ORDER — DOCUSATE SODIUM 100 MG PO CAPS
200.0000 mg | ORAL_CAPSULE | Freq: Two times a day (BID) | ORAL | Status: DC
  Administered 2020-03-04 – 2020-03-05 (×4): 200 mg via ORAL
  Filled 2020-03-04 (×5): qty 2

## 2020-03-04 MED ORDER — PRAVASTATIN SODIUM 40 MG PO TABS
40.0000 mg | ORAL_TABLET | Freq: Every day | ORAL | Status: DC
  Administered 2020-03-04 – 2020-03-05 (×2): 40 mg via ORAL
  Filled 2020-03-04: qty 2
  Filled 2020-03-04: qty 1

## 2020-03-04 MED ORDER — METHOCARBAMOL 500 MG PO TABS
500.0000 mg | ORAL_TABLET | Freq: Three times a day (TID) | ORAL | Status: DC
  Administered 2020-03-04 – 2020-03-05 (×4): 500 mg via ORAL
  Filled 2020-03-04 (×4): qty 1

## 2020-03-04 MED ORDER — FERROUS SULFATE 325 (65 FE) MG PO TABS
325.0000 mg | ORAL_TABLET | Freq: Every day | ORAL | Status: DC
  Administered 2020-03-05: 325 mg via ORAL
  Filled 2020-03-04: qty 1

## 2020-03-04 MED ORDER — SODIUM CHLORIDE 0.9 % IV BOLUS
1000.0000 mL | Freq: Once | INTRAVENOUS | Status: AC
  Administered 2020-03-04: 1000 mL via INTRAVENOUS

## 2020-03-04 MED ORDER — TELMISARTAN-HCTZ 40-12.5 MG PO TABS: 1.0000 | ORAL_TABLET | Freq: Every morning | ORAL | Status: DC

## 2020-03-04 MED ORDER — ONDANSETRON 4 MG PO TBDP
4.0000 mg | ORAL_TABLET | Freq: Four times a day (QID) | ORAL | Status: DC | PRN
  Administered 2020-03-04: 4 mg via ORAL
  Filled 2020-03-04: qty 1

## 2020-03-04 MED ORDER — HYDROCHLOROTHIAZIDE 12.5 MG PO CAPS
12.5000 mg | ORAL_CAPSULE | Freq: Every day | ORAL | Status: DC
  Administered 2020-03-04 – 2020-03-05 (×2): 12.5 mg via ORAL
  Filled 2020-03-04 (×2): qty 1

## 2020-03-04 MED ORDER — FAMOTIDINE 20 MG PO TABS
40.0000 mg | ORAL_TABLET | Freq: Two times a day (BID) | ORAL | Status: DC
  Administered 2020-03-04 – 2020-03-05 (×4): 40 mg via ORAL
  Filled 2020-03-04 (×4): qty 2

## 2020-03-04 MED ORDER — SODIUM CHLORIDE (PF) 0.9 % IJ SOLN
INTRAMUSCULAR | Status: AC
  Filled 2020-03-04: qty 50

## 2020-03-04 MED ORDER — BACITRACIN ZINC 500 UNIT/GM EX OINT
TOPICAL_OINTMENT | CUTANEOUS | Status: AC
  Filled 2020-03-04: qty 0.9

## 2020-03-04 MED ORDER — IOHEXOL 300 MG/ML  SOLN
75.0000 mL | Freq: Once | INTRAMUSCULAR | Status: AC | PRN
  Administered 2020-03-04: 65 mL via INTRAVENOUS

## 2020-03-04 MED ORDER — LOPERAMIDE HCL 2 MG PO CAPS: 2.0000 mg | ORAL_CAPSULE | ORAL | Status: DC | PRN

## 2020-03-04 MED ORDER — GABAPENTIN 100 MG PO CAPS
100.0000 mg | ORAL_CAPSULE | Freq: Every day | ORAL | Status: DC
  Administered 2020-03-05: 100 mg via ORAL
  Filled 2020-03-04: qty 1

## 2020-03-04 MED ORDER — OXYCODONE HCL 5 MG PO TABS
5.0000 mg | ORAL_TABLET | Freq: Four times a day (QID) | ORAL | Status: DC | PRN
  Administered 2020-03-04: 5 mg via ORAL
  Filled 2020-03-04: qty 1

## 2020-03-04 MED ORDER — ONDANSETRON HCL 4 MG/2ML IJ SOLN: 4.0000 mg | Freq: Four times a day (QID) | INTRAMUSCULAR | Status: DC | PRN

## 2020-03-04 MED ORDER — LACTATED RINGERS IV SOLN: INTRAVENOUS | Status: DC

## 2020-03-04 MED ORDER — FENTANYL CITRATE (PF) 100 MCG/2ML IJ SOLN
50.0000 ug | Freq: Once | INTRAMUSCULAR | Status: AC
  Administered 2020-03-04: 50 ug via INTRAVENOUS
  Filled 2020-03-04: qty 2

## 2020-03-04 MED ORDER — SODIUM CHLORIDE 0.9 % IV SOLN: INTRAVENOUS | Status: DC

## 2020-03-04 MED ORDER — MELATONIN 5 MG PO TABS
10.0000 mg | ORAL_TABLET | Freq: Every day | ORAL | Status: DC
  Administered 2020-03-05: 10 mg via ORAL
  Filled 2020-03-04: qty 2

## 2020-03-04 MED ORDER — FOLIC ACID 1 MG PO TABS
1.0000 mg | ORAL_TABLET | Freq: Every day | ORAL | Status: DC
  Administered 2020-03-04 – 2020-03-05 (×2): 1 mg via ORAL
  Filled 2020-03-04 (×2): qty 1

## 2020-03-04 NOTE — ED Notes (Signed)
Pt had indigestion after eating, Zofran provided to see if it will help with discomfort. Pt on hospital bed, positioned to rest at present time with call light in reach.

## 2020-03-04 NOTE — ED Notes (Signed)
[facility] called for transport to [facility] 
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
## 2020-03-04 NOTE — ED Notes (Signed)
Lunch tray provided, pt aware of delays in transporting

## 2020-03-04 NOTE — ED Notes (Signed)
Attempted to call report to 6N, but RN is unable to take report at this time.

## 2020-03-04 NOTE — ED Notes (Signed)
Pt sitting on side of bed eating breakfast.
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
## 2020-03-04 NOTE — ED Notes (Signed)
Trauma MD at bedside.

## 2020-03-05 ENCOUNTER — Encounter (HOSPITAL_COMMUNITY): Payer: Self-pay

## 2020-03-05 MED ORDER — BACITRACIN ZINC 500 UNIT/GM EX OINT
TOPICAL_OINTMENT | Freq: Two times a day (BID) | CUTANEOUS | Status: DC
  Filled 2020-03-05: qty 28.35

## 2020-03-05 MED ORDER — ENOXAPARIN SODIUM 40 MG/0.4ML ~~LOC~~ SOLN
40.0000 mg | SUBCUTANEOUS | Status: DC
  Administered 2020-03-05: 40 mg via SUBCUTANEOUS
  Filled 2020-03-05: qty 0.4

## 2020-03-05 MED ORDER — MUPIROCIN 2 % EX OINT
TOPICAL_OINTMENT | CUTANEOUS | Status: AC
  Filled 2020-03-05: qty 22

## 2020-03-05 MED ORDER — BACITRACIN ZINC 500 UNIT/GM EX OINT: TOPICAL_OINTMENT | Freq: Two times a day (BID) | CUTANEOUS | 0 refills | Status: DC

## 2020-03-05 MED ORDER — ACETAMINOPHEN 500 MG PO TABS: 1000.0000 mg | ORAL_TABLET | Freq: Three times a day (TID) | ORAL | 0 refills | Status: AC | PRN

## 2020-03-05 MED ORDER — OXYCODONE HCL 5 MG PO TABS: 5.0000 mg | ORAL_TABLET | Freq: Four times a day (QID) | ORAL | 0 refills | Status: DC | PRN

## 2020-03-05 MED ORDER — POLYETHYLENE GLYCOL 3350 17 G PO PACK: 17.0000 g | PACK | Freq: Every day | ORAL | 0 refills | Status: DC | PRN

## 2020-03-05 MED ORDER — DOCUSATE SODIUM 100 MG PO CAPS
100.0000 mg | ORAL_CAPSULE | Freq: Two times a day (BID) | ORAL | 2 refills | Status: DC | PRN
Start: 2020-03-05 — End: 2023-09-19

## 2020-03-05 NOTE — Progress Notes (Signed)
Arrived to [facility] by [facility] from [facility].
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
## 2020-03-05 NOTE — Discharge Instructions (Signed)
Rib Fracture  A rib fracture is a break or crack in one of the bones of the ribs. The ribs are like a cage that goes around your upper chest. A broken or cracked rib is often painful, but most do not cause other problems. Most rib fractures usually heal on their own in 1-3 months. Follow these instructions at home: Managing pain, stiffness, and swelling  If directed, apply ice to the injured area. ? Put ice in a plastic bag. ? Place a towel between your skin and the bag. ? Leave the ice on for 20 minutes, 2-3 times a day.  Take over-the-counter and prescription medicines only as told by your doctor. Activity  Avoid activities that cause pain to the injured area. Protect your injured area.  Slowly increase activity as told by your doctor. General instructions  Do deep breathing as told by your doctor. You may be told to: ? Take deep breaths many times a day. ? Cough many times a day while hugging a pillow. ? Use a device (incentive spirometer) to do deep breathing many times a day.  Drink enough fluid to keep your pee (urine) clear or pale yellow.  Do not wear a rib belt or binder. These do not allow you to breathe deeply.  Keep all follow-up visits as told by your doctor. This is important. Contact a doctor if:  You have a fever. Get help right away if:  You have trouble breathing.  You are short of breath.  You cannot stop coughing.  You cough up thick or bloody spit (sputum).  You feel sick to your stomach (nauseous), throw up (vomit), or have belly (abdominal) pain.  Your pain gets worse and medicine does not help. Summary  A rib fracture is a break or crack in one of the bones of the ribs.  Apply ice to the injured area and take medicines for pain as told by your doctor.  Take deep breaths and cough many times a day. Hug a pillow every time you cough. This information is not intended to replace advice given to you by your health care provider. Make sure you  discuss any questions you have with your health care provider. Document Revised: 07/21/2017 Document Reviewed: 11/08/2016 Elsevier Patient Education  2020 [employer]    Sternal Fracture  A sternal fracture is a break in the bone in the center of the chest (sternum or breastbone). This type of fracture often causes pain that can get worse when you breathe deeply or cough. A sternal fracture is not dangerous unless there is also an injury to your heart or lungs, which are protected by the sternum and ribs. What are the causes? This condition is usually caused by a forceful injury from:  Motor vehicle accidents. This is the most common cause.  Contact sports.  Physical assaults.  Falls. You can also develop a sternal fracture without having a forceful injury if the bone becomes weakened over time (stress fracture or insufficiency fracture). What increases the risk? You are more likely to have a sternal fracture if you:  Participate in contact sports, such as football, lacrosse, wrestling, or martial arts.  Work at elevated heights, such as in Holiday representative. This increases your risk of a fall. The following factors may make you more likely to develop a stress fracture or insufficiency fracture:  Being male.  Being a postmenopausal woman.  Being 4 years of age or older.  Having weak bones (osteoporosis).  Having severe curvature of  the spine.  Being on long-term steroid treatment. What are the signs or symptoms? Symptoms of this condition include:  Pain over the sternum or chest wall.  Tenderness of the sternum or chest wall.  Pain that gets worse when you breathe deeply or cough.  Shortness of breath.  A bruise (contusion) over the chest.  Swelling.  A crackling sound when taking a deep breath or pressing on the sternum. How is this diagnosed? This condition is diagnosed based on:  A physical exam.  Your medical history.  Tests, such as: ? Blood oxygen  level. This is measured with a pulse oximetry test. ? Repeated electrocardiograms (ECGs). This is to make sure that your heart is not injured. ? A blood test. This is to check for damage to your heart muscle. ? Imaging tests, such as:  A CT scan.  An ultrasound.  Chest X-rays. How is this treated? Treatment depends on the severity of your injury.  A sternal fracture without any other injury (isolated sternal fracture) usually heals without treatment. You may need to: ? Limit some activities at home. ? Take medicines for pain relief. ? Do deep breathing exercises to prevent injury and infection to your lungs.  In rare cases, surgery may be needed if a sternal fracture: ? Continues to cause severe pain. ? Causes shortness of breath or respiratory problems. ? Involves bones that have been moved too far out of position (displaced fracture). Follow these instructions at home: Managing pain, stiffness, and swelling   If directed, put ice on the injured area. ? Put ice in a plastic bag. ? Place a towel between your skin and the bag. ? Leave the ice on for 20 minutes, 2-3 times a day. Medicines  Take over-the-counter and prescription medicines only as told by your health care provider.  Ask your health care provider if the medicine prescribed to you: ? Requires you to avoid driving or using heavy machinery. ? Can cause constipation. You may need to take actions to prevent or treat constipation, such as:  Drink enough fluid to keep your urine pale yellow.  Take over-the-counter or prescription medicines.  Eat foods that are high in fiber, such as beans, whole grains, and fresh fruits and vegetables.  Limit foods that are high in fat and processed sugars, such as fried or sweet foods. Activity  Rest at home.  Return to your normal activities as told by your health care provider. Ask your health care provider what activities are safe for you.  Do breathing exercises as told by  your health care provider.  Do not push or pull with your arms when getting in and out of bed.  Do not lift anything that is heavier than 10 lb (4.5 kg), or the limit that you are told, until your health care provider says that it is safe. General instructions  Hug a pillow when you sneeze, cough, or twist or bend at the waist. Doing this helps support your chest.  Do not use any products that contain nicotine or tobacco, such as cigarettes, e-cigarettes, and chewing tobacco. These can delay bone healing. If you need help quitting, ask your health care provider.  Keep all follow-up visits as told by your health care provider. This is important. Contact a health care provider if:  Your pain medicine is not helping.  You continue to have pain after several weeks.  You have swelling or bruising that gets worse.  You develop a fever or chills.  You  develop a cough and you cough up thick or bloody mucus from your lungs (sputum). Get help right away if you:  Have difficulty breathing.  Have chest pain.  Feel light-headed.  Have fast or irregular heartbeats (palpitations).  Feel nauseous or have pain in your abdomen. Summary  A sternal fracture is a break in the bone in the center of the chest (sternum or breastbone).  This condition is usually caused by a forceful injury. The most common cause is motor vehicle accidents.  If directed, put ice on the injured area.  Return to your normal activities as told by your health care provider. Ask your health care provider what activities are safe for you. This information is not intended to replace advice given to you by your health care provider. Make sure you discuss any questions you have with your health care provider. Document Revised: 08/09/2018 Document Reviewed: 08/09/2018 Elsevier Patient Education  2020 [employer]   Wound Care, Adult Taking care of your wound properly can help to prevent pain, infection, and scarring.  It can also help your wound to heal more quickly. How to care for your wound Wound care      Follow instructions from your health care provider about how to take care of your wound. Make sure you: ? Wash your hands with soap and water before you change the bandage (dressing). If soap and water are not available, use hand sanitizer. ? Change your dressing as told by your health care provider. ? Leave stitches (sutures), skin glue, or adhesive strips in place. These skin closures may need to stay in place for 2 weeks or longer. If adhesive strip edges start to loosen and curl up, you may trim the loose edges. Do not remove adhesive strips completely unless your health care provider tells you to do that.  Check your wound area every day for signs of infection. Check for: ? Redness, swelling, or pain. ? Fluid or blood. ? Warmth. ? Pus or a bad smell.  Ask your health care provider if you should clean the wound with mild soap and water. Doing this may include: ? Using a clean towel to pat the wound dry after cleaning it. Do not rub or scrub the wound. ? Applying a cream or ointment. Do this only as told by your health care provider. ? Covering the incision with a clean dressing.  Ask your health care provider when you can leave the wound uncovered.  Keep the dressing dry until your health care provider says it can be removed. Do not take baths, swim, use a hot tub, or do anything that would put the wound underwater until your health care provider approves. Ask your health care provider if you can take showers. You may only be allowed to take sponge baths. Medicines   If you were prescribed an antibiotic medicine, cream, or ointment, take or use the antibiotic as told by your health care provider. Do not stop taking or using the antibiotic even if your condition improves.  Take over-the-counter and prescription medicines only as told by your health care provider. If you were prescribed pain  medicine, take it 30 or more minutes before you do any wound care or as told by your health care provider. General instructions  Return to your normal activities as told by your health care provider. Ask your health care provider what activities are safe.  Do not scratch or pick at the wound.  Do not use any products that contain  nicotine or tobacco, such as cigarettes and e-cigarettes. These may delay wound healing. If you need help quitting, ask your health care provider.  Keep all follow-up visits as told by your health care provider. This is important.  Eat a diet that includes protein, vitamin A, vitamin C, and other nutrient-rich foods to help the wound heal. ? Foods rich in protein include meat, dairy, beans, nuts, and other sources. ? Foods rich in vitamin A include carrots and dark green, leafy vegetables. ? Foods rich in vitamin C include citrus, tomatoes, and other fruits and vegetables. ? Nutrient-rich foods have protein, carbohydrates, fat, vitamins, or minerals. Eat a variety of healthy foods including vegetables, fruits, and whole grains. Contact a health care provider if:  You received a tetanus shot and you have swelling, severe pain, redness, or bleeding at the injection site.  Your pain is not controlled with medicine.  You have redness, swelling, or pain around the wound.  You have fluid or blood coming from the wound.  Your wound feels warm to the touch.  You have pus or a bad smell coming from the wound.  You have a fever or chills.  You are nauseous or you vomit.  You are dizzy. Get help right away if:  You have a red streak going away from your wound.  The edges of the wound open up and separate.  Your wound is bleeding, and the bleeding does not stop with gentle pressure.  You have a rash.  You faint.  You have trouble breathing. Summary  Always wash your hands with soap and water before changing your bandage (dressing).  To help with  healing, eat foods that are rich in protein, vitamin A, vitamin C, and other nutrients.  Check your wound every day for signs of infection. Contact your health care provider if you suspect that your wound is infected. This information is not intended to replace advice given to you by your health care provider. Make sure you discuss any questions you have with your health care provider. Document Revised: 11/26/2018 Document Reviewed: 02/23/2016 Elsevier Patient Education  2020 [employer].
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
## 2020-03-05 NOTE — Progress Notes (Signed)
Patient discharged to home. Verbalizes understanding of all discharge instructions including wound care to the right index finger, discharge medications, and follow up MD visits. Patient's daughter at bedside for discharge instructions.
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
## 2023-01-09 ENCOUNTER — Encounter (HOSPITAL_BASED_OUTPATIENT_CLINIC_OR_DEPARTMENT_OTHER): Payer: Self-pay | Admitting: Emergency Medicine

## 2023-01-09 ENCOUNTER — Other Ambulatory Visit: Payer: Self-pay

## 2023-01-09 ENCOUNTER — Emergency Department (HOSPITAL_BASED_OUTPATIENT_CLINIC_OR_DEPARTMENT_OTHER)
Admission: EM | Admit: 2023-01-09 | Discharge: 2023-01-10 | Disposition: A | Discharge status: Home / Self Care | Payer: Medicare Other | Attending: Emergency Medicine | Admitting: Emergency Medicine

## 2023-01-09 DIAGNOSIS — Z79899 Other long term (current) drug therapy: Secondary | ICD-10-CM | POA: Insufficient documentation

## 2023-01-09 DIAGNOSIS — N289 Disorder of kidney and ureter, unspecified: Secondary | ICD-10-CM | POA: Diagnosis not present

## 2023-01-09 DIAGNOSIS — I10 Essential (primary) hypertension: Secondary | ICD-10-CM | POA: Insufficient documentation

## 2023-01-09 DIAGNOSIS — K5900 Constipation, unspecified: Secondary | ICD-10-CM | POA: Diagnosis present

## 2023-01-09 DIAGNOSIS — Z711 Person with feared health complaint in whom no diagnosis is made: Secondary | ICD-10-CM | POA: Diagnosis not present

## 2023-01-09 NOTE — ED Triage Notes (Signed)
Patient arrived via POV c/o constipation x 1 day. Patient denies pain. Patient is ao x 4, vs wdl, normal gait.

## 2023-01-10 ENCOUNTER — Emergency Department (HOSPITAL_BASED_OUTPATIENT_CLINIC_OR_DEPARTMENT_OTHER): Payer: Medicare Other

## 2023-01-10 DIAGNOSIS — Z711 Person with feared health complaint in whom no diagnosis is made: Secondary | ICD-10-CM | POA: Diagnosis not present

## 2023-01-10 LAB — CBC
HCT: 36.5 % — ABNORMAL LOW (ref 39.0–52.0)
Hemoglobin: 12.6 g/dL — ABNORMAL LOW (ref 13.0–17.0)
MCH: 33.7 pg (ref 26.0–34.0)
MCHC: 34.5 g/dL (ref 30.0–36.0)
MCV: 97.6 fL (ref 80.0–100.0)
Platelets: 315 10*3/uL (ref 150–400)
RBC: 3.74 MIL/uL — ABNORMAL LOW (ref 4.22–5.81)
RDW: 13 % (ref 11.5–15.5)
WBC: 7.6 10*3/uL (ref 4.0–10.5)
nRBC: 0 % (ref 0.0–0.2)

## 2023-01-10 LAB — URINALYSIS, ROUTINE W REFLEX MICROSCOPIC
Bilirubin Urine: NEGATIVE
Glucose, UA: NEGATIVE mg/dL
Ketones, ur: NEGATIVE mg/dL
Nitrite: NEGATIVE
Protein, ur: NEGATIVE mg/dL
Specific Gravity, Urine: 1.02 (ref 1.005–1.030)
pH: 7.5 (ref 5.0–8.0)

## 2023-01-10 LAB — URINALYSIS, MICROSCOPIC (REFLEX)

## 2023-01-10 LAB — COMPREHENSIVE METABOLIC PANEL
ALT: 37 U/L (ref 0–44)
AST: 40 U/L (ref 15–41)
Albumin: 4.1 g/dL (ref 3.5–5.0)
Alkaline Phosphatase: 52 U/L (ref 38–126)
Anion gap: 9 (ref 5–15)
BUN: 53 mg/dL — ABNORMAL HIGH (ref 8–23)
CO2: 25 mmol/L (ref 22–32)
Calcium: 10.2 mg/dL (ref 8.9–10.3)
Chloride: 104 mmol/L (ref 98–111)
Creatinine, Ser: 2.11 mg/dL — ABNORMAL HIGH (ref 0.61–1.24)
GFR, Estimated: 31 mL/min — ABNORMAL LOW (ref >60)
Glucose, Bld: 135 mg/dL — ABNORMAL HIGH (ref 70–99)
Potassium: 4.6 mmol/L (ref 3.5–5.1)
Sodium: 138 mmol/L (ref 135–145)
Total Bilirubin: 0.3 mg/dL (ref 0.3–1.2)
Total Protein: 7.5 g/dL (ref 6.5–8.1)

## 2023-01-10 MED ORDER — LIDOCAINE HCL URETHRAL/MUCOSAL 2 % EX GEL
1.0000 | Freq: Once | CUTANEOUS | Status: AC
  Administered 2023-01-10: 1 via TOPICAL
  Filled 2023-01-10: qty 11

## 2023-01-10 MED ORDER — SODIUM CHLORIDE 0.9 % IV BOLUS
500.0000 mL | Freq: Once | INTRAVENOUS | Status: AC
  Administered 2023-01-10: 500 mL via INTRAVENOUS

## 2023-01-10 NOTE — ED Notes (Signed)
Patient transported to CT 

## 2023-01-10 NOTE — ED Notes (Signed)
Pt ambulating to the bathroom to attempt to have BM x 3. No success.
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
## 2023-09-17 ENCOUNTER — Emergency Department (HOSPITAL_BASED_OUTPATIENT_CLINIC_OR_DEPARTMENT_OTHER)
Admission: EM | Admit: 2023-09-17 | Discharge: 2023-09-17 | Discharge status: Against Medical Advice | Payer: Medicare Other | Attending: Emergency Medicine | Admitting: Emergency Medicine

## 2023-09-17 ENCOUNTER — Encounter (HOSPITAL_BASED_OUTPATIENT_CLINIC_OR_DEPARTMENT_OTHER): Payer: Self-pay | Admitting: Emergency Medicine

## 2023-09-17 ENCOUNTER — Other Ambulatory Visit: Payer: Self-pay

## 2023-09-17 DIAGNOSIS — R339 Retention of urine, unspecified: Secondary | ICD-10-CM | POA: Diagnosis not present

## 2023-09-17 DIAGNOSIS — K6289 Other specified diseases of anus and rectum: Secondary | ICD-10-CM | POA: Insufficient documentation

## 2023-09-17 DIAGNOSIS — N390 Urinary tract infection, site not specified: Secondary | ICD-10-CM | POA: Diagnosis not present

## 2023-09-17 DIAGNOSIS — K5900 Constipation, unspecified: Secondary | ICD-10-CM | POA: Insufficient documentation

## 2023-09-17 DIAGNOSIS — Z5321 Procedure and treatment not carried out due to patient leaving prior to being seen by health care provider: Secondary | ICD-10-CM | POA: Insufficient documentation

## 2023-09-17 NOTE — ED Triage Notes (Signed)
Pt c/o rectal pain and constipation; thinks his last BM was 1-2 days ago; tried an enema at home w/o relief

## 2023-09-19 ENCOUNTER — Emergency Department (HOSPITAL_BASED_OUTPATIENT_CLINIC_OR_DEPARTMENT_OTHER): Payer: Medicare Other

## 2023-09-19 ENCOUNTER — Encounter (HOSPITAL_COMMUNITY): Payer: Self-pay | Admitting: *Deleted

## 2023-09-19 ENCOUNTER — Other Ambulatory Visit: Payer: Self-pay

## 2023-09-19 ENCOUNTER — Inpatient Hospital Stay (HOSPITAL_BASED_OUTPATIENT_CLINIC_OR_DEPARTMENT_OTHER)
Admission: EM | Admit: 2023-09-19 | Discharge: 2023-09-24 | DRG: 689 | Disposition: A | Discharge status: Skilled Nursing Facility | Payer: Medicare Other | Attending: Internal Medicine | Admitting: Internal Medicine

## 2023-09-19 ENCOUNTER — Observation Stay (HOSPITAL_COMMUNITY): Payer: Medicare Other

## 2023-09-19 DIAGNOSIS — Z885 Allergy status to narcotic agent status: Secondary | ICD-10-CM

## 2023-09-19 DIAGNOSIS — N183 Chronic kidney disease, stage 3 unspecified: Secondary | ICD-10-CM | POA: Insufficient documentation

## 2023-09-19 DIAGNOSIS — Z96611 Presence of right artificial shoulder joint: Secondary | ICD-10-CM | POA: Diagnosis not present

## 2023-09-19 DIAGNOSIS — N1832 Chronic kidney disease, stage 3b: Secondary | ICD-10-CM | POA: Diagnosis not present

## 2023-09-19 DIAGNOSIS — K529 Noninfective gastroenteritis and colitis, unspecified: Secondary | ICD-10-CM | POA: Diagnosis present

## 2023-09-19 DIAGNOSIS — H9190 Unspecified hearing loss, unspecified ear: Secondary | ICD-10-CM | POA: Diagnosis not present

## 2023-09-19 DIAGNOSIS — B952 Enterococcus as the cause of diseases classified elsewhere: Secondary | ICD-10-CM | POA: Diagnosis present

## 2023-09-19 DIAGNOSIS — R195 Other fecal abnormalities: Secondary | ICD-10-CM | POA: Insufficient documentation

## 2023-09-19 DIAGNOSIS — I10 Essential (primary) hypertension: Secondary | ICD-10-CM | POA: Insufficient documentation

## 2023-09-19 DIAGNOSIS — R339 Retention of urine, unspecified: Secondary | ICD-10-CM | POA: Diagnosis present

## 2023-09-19 DIAGNOSIS — K5900 Constipation, unspecified: Secondary | ICD-10-CM | POA: Diagnosis present

## 2023-09-19 DIAGNOSIS — G9341 Metabolic encephalopathy: Secondary | ICD-10-CM | POA: Diagnosis not present

## 2023-09-19 DIAGNOSIS — Z860100 Personal history of colon polyps, unspecified: Secondary | ICD-10-CM

## 2023-09-19 DIAGNOSIS — E7800 Pure hypercholesterolemia, unspecified: Secondary | ICD-10-CM | POA: Diagnosis not present

## 2023-09-19 DIAGNOSIS — Z83438 Family history of other disorder of lipoprotein metabolism and other lipidemia: Secondary | ICD-10-CM | POA: Diagnosis not present

## 2023-09-19 DIAGNOSIS — K2971 Gastritis, unspecified, with bleeding: Secondary | ICD-10-CM | POA: Diagnosis present

## 2023-09-19 DIAGNOSIS — G3184 Mild cognitive impairment, so stated: Secondary | ICD-10-CM | POA: Diagnosis present

## 2023-09-19 DIAGNOSIS — Z87442 Personal history of urinary calculi: Secondary | ICD-10-CM | POA: Diagnosis not present

## 2023-09-19 DIAGNOSIS — Z79899 Other long term (current) drug therapy: Secondary | ICD-10-CM

## 2023-09-19 DIAGNOSIS — Z79890 Hormone replacement therapy (postmenopausal): Secondary | ICD-10-CM

## 2023-09-19 DIAGNOSIS — Z888 Allergy status to other drugs, medicaments and biological substances status: Secondary | ICD-10-CM | POA: Diagnosis not present

## 2023-09-19 DIAGNOSIS — K648 Other hemorrhoids: Secondary | ICD-10-CM | POA: Diagnosis present

## 2023-09-19 DIAGNOSIS — Z8249 Family history of ischemic heart disease and other diseases of the circulatory system: Secondary | ICD-10-CM | POA: Diagnosis not present

## 2023-09-19 DIAGNOSIS — Z820 Family history of epilepsy and other diseases of the nervous system: Secondary | ICD-10-CM

## 2023-09-19 DIAGNOSIS — N390 Urinary tract infection, site not specified: Principal | ICD-10-CM | POA: Diagnosis present

## 2023-09-19 DIAGNOSIS — N3000 Acute cystitis without hematuria: Secondary | ICD-10-CM

## 2023-09-19 DIAGNOSIS — G4733 Obstructive sleep apnea (adult) (pediatric): Secondary | ICD-10-CM | POA: Insufficient documentation

## 2023-09-19 DIAGNOSIS — R338 Other retention of urine: Secondary | ICD-10-CM | POA: Diagnosis present

## 2023-09-19 DIAGNOSIS — K449 Diaphragmatic hernia without obstruction or gangrene: Secondary | ICD-10-CM | POA: Diagnosis not present

## 2023-09-19 DIAGNOSIS — I129 Hypertensive chronic kidney disease with stage 1 through stage 4 chronic kidney disease, or unspecified chronic kidney disease: Secondary | ICD-10-CM | POA: Diagnosis present

## 2023-09-19 DIAGNOSIS — D631 Anemia in chronic kidney disease: Secondary | ICD-10-CM | POA: Diagnosis present

## 2023-09-19 DIAGNOSIS — Z7982 Long term (current) use of aspirin: Secondary | ICD-10-CM

## 2023-09-19 DIAGNOSIS — R55 Syncope and collapse: Principal | ICD-10-CM

## 2023-09-19 DIAGNOSIS — R296 Repeated falls: Secondary | ICD-10-CM | POA: Diagnosis present

## 2023-09-19 DIAGNOSIS — K2981 Duodenitis with bleeding: Secondary | ICD-10-CM | POA: Diagnosis not present

## 2023-09-19 DIAGNOSIS — K219 Gastro-esophageal reflux disease without esophagitis: Secondary | ICD-10-CM | POA: Diagnosis present

## 2023-09-19 DIAGNOSIS — E785 Hyperlipidemia, unspecified: Secondary | ICD-10-CM | POA: Diagnosis present

## 2023-09-19 DIAGNOSIS — R410 Disorientation, unspecified: Secondary | ICD-10-CM

## 2023-09-19 DIAGNOSIS — N401 Enlarged prostate with lower urinary tract symptoms: Secondary | ICD-10-CM | POA: Diagnosis present

## 2023-09-19 DIAGNOSIS — E8770 Fluid overload, unspecified: Secondary | ICD-10-CM | POA: Diagnosis present

## 2023-09-19 LAB — CBC
HCT: 38.7 % — ABNORMAL LOW (ref 39.0–52.0)
Hemoglobin: 13.4 g/dL (ref 13.0–17.0)
MCH: 33.5 pg (ref 26.0–34.0)
MCHC: 34.6 g/dL (ref 30.0–36.0)
MCV: 96.8 fL (ref 80.0–100.0)
Platelets: 337 10*3/uL (ref 150–400)
RBC: 4 MIL/uL — ABNORMAL LOW (ref 4.22–5.81)
RDW: 12.7 % (ref 11.5–15.5)
WBC: 14.9 10*3/uL — ABNORMAL HIGH (ref 4.0–10.5)
nRBC: 0 % (ref 0.0–0.2)

## 2023-09-19 LAB — URINALYSIS, ROUTINE W REFLEX MICROSCOPIC
Bacteria, UA: NONE SEEN
Bilirubin Urine: NEGATIVE
Glucose, UA: NEGATIVE mg/dL
Ketones, ur: NEGATIVE mg/dL
Nitrite: NEGATIVE
Protein, ur: 100 mg/dL — AB
RBC / HPF: >50 RBC/hpf (ref 0–5)
Specific Gravity, Urine: 1.021 (ref 1.005–1.030)
pH: 6 (ref 5.0–8.0)

## 2023-09-19 LAB — COMPREHENSIVE METABOLIC PANEL
ALT: 35 U/L (ref 0–44)
AST: 74 U/L — ABNORMAL HIGH (ref 15–41)
Albumin: 4.4 g/dL (ref 3.5–5.0)
Alkaline Phosphatase: 45 U/L (ref 38–126)
Anion gap: 14 (ref 5–15)
BUN: 52 mg/dL — ABNORMAL HIGH (ref 8–23)
CO2: 21 mmol/L — ABNORMAL LOW (ref 22–32)
Calcium: 10.6 mg/dL — ABNORMAL HIGH (ref 8.9–10.3)
Chloride: 101 mmol/L (ref 98–111)
Creatinine, Ser: 2.03 mg/dL — ABNORMAL HIGH (ref 0.61–1.24)
GFR, Estimated: 33 mL/min — ABNORMAL LOW (ref >60)
Glucose, Bld: 136 mg/dL — ABNORMAL HIGH (ref 70–99)
Potassium: 4.4 mmol/L (ref 3.5–5.1)
Sodium: 136 mmol/L (ref 135–145)
Total Bilirubin: 1.5 mg/dL — ABNORMAL HIGH (ref 0.0–1.2)
Total Protein: 8.1 g/dL (ref 6.5–8.1)

## 2023-09-19 LAB — OCCULT BLOOD X 1 CARD TO LAB, STOOL: Fecal Occult Bld: POSITIVE — AB

## 2023-09-19 LAB — DIFFERENTIAL
Abs Immature Granulocytes: 0.05 10*3/uL (ref 0.00–0.07)
Basophils Absolute: 0 10*3/uL (ref 0.0–0.1)
Basophils Relative: 0 %
Eosinophils Absolute: 0 10*3/uL (ref 0.0–0.5)
Eosinophils Relative: 0 %
Immature Granulocytes: 0 %
Lymphocytes Relative: 6 %
Lymphs Abs: 0.8 10*3/uL (ref 0.7–4.0)
Monocytes Absolute: 1.9 10*3/uL — ABNORMAL HIGH (ref 0.1–1.0)
Monocytes Relative: 13 %
Neutro Abs: 12 10*3/uL — ABNORMAL HIGH (ref 1.7–7.7)
Neutrophils Relative %: 81 %

## 2023-09-19 LAB — ETHANOL: Alcohol, Ethyl (B): <10 mg/dL (ref <10)

## 2023-09-19 MED ORDER — ACETAMINOPHEN 325 MG PO TABS
650.0000 mg | ORAL_TABLET | Freq: Four times a day (QID) | ORAL | Status: DC | PRN
  Administered 2023-09-20 – 2023-09-24 (×9): 650 mg via ORAL
  Filled 2023-09-19 (×9): qty 2

## 2023-09-19 MED ORDER — SODIUM CHLORIDE 0.9 % IV SOLN: INTRAVENOUS | Status: DC

## 2023-09-19 MED ORDER — FAMOTIDINE 20 MG PO TABS
40.0000 mg | ORAL_TABLET | Freq: Every day | ORAL | Status: DC
  Administered 2023-09-20 – 2023-09-24 (×5): 40 mg via ORAL
  Filled 2023-09-19 (×5): qty 2

## 2023-09-19 MED ORDER — POTASSIUM CITRATE ER 10 MEQ (1080 MG) PO TBCR
20.0000 meq | EXTENDED_RELEASE_TABLET | Freq: Two times a day (BID) | ORAL | Status: DC
  Administered 2023-09-20 – 2023-09-24 (×11): 20 meq via ORAL
  Filled 2023-09-19 (×11): qty 2

## 2023-09-19 MED ORDER — ONDANSETRON HCL 4 MG/2ML IJ SOLN: 4.0000 mg | Freq: Four times a day (QID) | INTRAMUSCULAR | Status: DC | PRN

## 2023-09-19 MED ORDER — SODIUM CHLORIDE 0.9 % IV SOLN: 100.0000 mL/h | INTRAVENOUS | Status: DC

## 2023-09-19 MED ORDER — POLYETHYLENE GLYCOL 3350 17 G PO PACK
17.0000 g | PACK | Freq: Every day | ORAL | Status: DC
  Administered 2023-09-20: 17 g via ORAL
  Filled 2023-09-19: qty 1

## 2023-09-19 MED ORDER — ENOXAPARIN SODIUM 40 MG/0.4ML IJ SOSY
40.0000 mg | PREFILLED_SYRINGE | INTRAMUSCULAR | Status: DC
Start: 2023-09-20 — End: 2023-09-20
  Administered 2023-09-20: 40 mg via SUBCUTANEOUS
  Filled 2023-09-19: qty 0.4

## 2023-09-19 MED ORDER — SODIUM CHLORIDE 0.9 % IV SOLN
2.0000 g | INTRAVENOUS | Status: DC
  Administered 2023-09-19 – 2023-09-20 (×2): 2 g via INTRAVENOUS
  Filled 2023-09-19 (×2): qty 20

## 2023-09-19 MED ORDER — METOPROLOL SUCCINATE ER 25 MG PO TB24
25.0000 mg | ORAL_TABLET | Freq: Every day | ORAL | Status: DC
  Administered 2023-09-20 – 2023-09-24 (×4): 25 mg via ORAL
  Filled 2023-09-19 (×5): qty 1

## 2023-09-19 MED ORDER — MELATONIN 5 MG PO TABS
5.0000 mg | ORAL_TABLET | Freq: Once | ORAL | Status: AC
  Administered 2023-09-20: 5 mg via ORAL
  Filled 2023-09-19: qty 1

## 2023-09-19 MED ORDER — METRONIDAZOLE 500 MG/100ML IV SOLN
500.0000 mg | Freq: Two times a day (BID) | INTRAVENOUS | Status: DC
  Administered 2023-09-19 – 2023-09-20 (×2): 500 mg via INTRAVENOUS
  Filled 2023-09-19 (×2): qty 100

## 2023-09-19 MED ORDER — ESCITALOPRAM OXALATE 10 MG PO TABS
10.0000 mg | ORAL_TABLET | Freq: Every day | ORAL | Status: DC
  Administered 2023-09-20 – 2023-09-24 (×5): 10 mg via ORAL
  Filled 2023-09-19 (×5): qty 1

## 2023-09-19 MED ORDER — CARMEX CLASSIC LIP BALM EX OINT: TOPICAL_OINTMENT | Freq: Once | CUTANEOUS | Status: DC

## 2023-09-19 MED ORDER — PRAVASTATIN SODIUM 20 MG PO TABS
40.0000 mg | ORAL_TABLET | Freq: Every day | ORAL | Status: DC
Start: 2023-09-20 — End: 2023-09-25
  Administered 2023-09-20 – 2023-09-24 (×5): 40 mg via ORAL
  Filled 2023-09-19 (×5): qty 2

## 2023-09-19 MED ORDER — ACETAMINOPHEN 650 MG RE SUPP
650.0000 mg | Freq: Four times a day (QID) | RECTAL | Status: DC | PRN
Start: 2023-09-19 — End: 2023-09-25

## 2023-09-19 MED ORDER — ONDANSETRON HCL 4 MG PO TABS: 4.0000 mg | ORAL_TABLET | Freq: Four times a day (QID) | ORAL | Status: DC | PRN

## 2023-09-19 MED ORDER — FENOFIBRATE 160 MG PO TABS
160.0000 mg | ORAL_TABLET | Freq: Every day | ORAL | Status: DC
  Administered 2023-09-20 – 2023-09-24 (×5): 160 mg via ORAL
  Filled 2023-09-19 (×5): qty 1

## 2023-09-19 MED ORDER — GABAPENTIN 100 MG PO CAPS
100.0000 mg | ORAL_CAPSULE | Freq: Every day | ORAL | Status: DC
  Administered 2023-09-20 – 2023-09-24 (×6): 100 mg via ORAL
  Filled 2023-09-19 (×7): qty 1

## 2023-09-19 MED ORDER — SODIUM CHLORIDE 0.9 % IV BOLUS
500.0000 mL | Freq: Once | INTRAVENOUS | Status: AC
  Administered 2023-09-19: 500 mL via INTRAVENOUS

## 2023-09-19 MED ORDER — DICLOFENAC SODIUM 1 % TD GEL: 2.0000 g | Freq: Three times a day (TID) | TRANSDERMAL | Status: DC | PRN

## 2023-09-19 NOTE — Assessment & Plan Note (Addendum)
Presents with urinary urgency, retention, WBC, UA with leuks, and weakness/confusion.  Suspect this is UTI.  Clinically he is not septic.  I recognize the CT shows colitis and he has hx diverticulitis so I will add on Flagyl. - Start Rocephin, Flagyl - Obtain blood cultures, urine culture

## 2023-09-19 NOTE — Assessment & Plan Note (Addendum)
History urethral stricture s/p DVIU (for urethral stricture?) BPH s/p PVP  - Consult Urology, appreciate cares - Foley placed in ER, they recommend continue at discharge and follow with Dr. Kyla Balzarine [facility] for voiding trial after discharge

## 2023-09-19 NOTE — ED Notes (Signed)
3 attempts to insert foley catheter. Unable to successfully insert.

## 2023-09-19 NOTE — ED Provider Notes (Signed)
Blood pressure 126/78, pulse 90, temperature 97.8 F (36.6 C), temperature source Oral, resp. rate 13, SpO2 96%.   In short, Jon Bush is a 78 y.o. male with a chief complaint of Fall .  Refer to the original H&P for additional details.  03:42 PM  Patient arrived from [facility]. Paged Urology to make them aware that patient is here.   Discussed patient's case with [facility] to request admission. Patient and family (if present) updated with plan.   I reviewed all nursing notes, vitals, pertinent old records, EKGs, labs, imaging (as available).    Maia Plan, MD 09/19/23 1740

## 2023-09-19 NOTE — ED Notes (Signed)
Pt stood up to use bedside commode and urinated all over the floor. Estimated >300cc noted

## 2023-09-19 NOTE — Assessment & Plan Note (Signed)
-  IV fluids and trend
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
## 2023-09-19 NOTE — Assessment & Plan Note (Signed)
-   Continue pravastatin, fibrate

## 2023-09-19 NOTE — Progress Notes (Signed)
Plan of Care Note for accepted transfer   Patient: Jon Bush MRN: [id number]   DOA: 09/19/2023  Facility requesting transfer: [facility].  Requesting Provider: Linwood Dibbles, MD. Reason for transfer: Near syncope, urinary retention, positive FOBT. Facility course:   78 year old male who lives by himself. He has a past medical history of BPH, hypertension, GERD, IBS, hyperlipidemia was brought to the emergency department due to having multiple falls yesterday.  He was found to have urinary retention with several unsuccessful Foley catheterization attempts.  He is going to be transferred ED to ED so urology can see him.  FOBT was positive, but the patient is taking iron and he is not anemic.  Accepted to the telemetry unit for near syncope workup and urinary retention.  Plan of care: The patient is accepted for admission to Telemetry unit, at [facility].  Please notify urology when the patient arrived to the hospital.   Author: Bobette Mo, MD 09/19/2023  Check [email or web address] for on-call coverage.  Nursing staff, Please call [facility] Admits & Consults System-Wide number on Amion as soon as patient's arrival, so appropriate admitting provider can evaluate the pt.

## 2023-09-19 NOTE — Assessment & Plan Note (Signed)
-  CPAP at night
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
## 2023-09-19 NOTE — ED Triage Notes (Signed)
Daughter reports multiple falls yesterday. Patient lives alone. Patient states he did not hit head. Daughter states he has been more confused and trouble "finishing sentences". No focal deficits. Patient appears to be A&Ox4, but daughter states patient is slower to answer.

## 2023-09-19 NOTE — ED Notes (Signed)
Attempted with RN, 54F and then 73F regular latex foley catheter, did not get advanced past approx. 2 inches in, hit firm resistance and stopped procedure.

## 2023-09-19 NOTE — ED Provider Notes (Signed)
[facility] Provider Note   CSN: [id number] Arrival date & time: 09/19/23  4098     History  Chief Complaint  Patient presents with   Jon Bush is a 78 y.o. male.   Fall     Patient has a history of kidney stones BPH migraines, irritable bowel syndrome, sleep apnea, hypertension, anemia who presents to the ED for evaluation after falls.  Patient states he was at home yesterday.  He is not exactly sure why but he ended up falling.  He does not think he lost consciousness.  He is not sure if he hit his head.  Patient states it took him a while to get up but maybe after about 15 minutes he was able to do so but he ended up falling again.  Patient states has been having some pain in his back since then.  He eventually was able to crawl back into bed.  He has been able to stand and go to the bathroom but it is difficult to do so.  His daughter checked on him and this morning.  He seems to be having more difficulty finishing his sentences.  He is shakier than usual and is slower to answer questions. Home Medications Prior to Admission medications   Medication Sig Start Date End Date Taking? Authorizing Provider  acetaminophen (TYLENOL) 500 MG tablet Take 2 tablets (1,000 mg total) by mouth every 8 (eight) hours as needed for mild pain. 03/05/20   Maczis, Elmer Sow, PA-C  aspirin 81 MG tablet Take 81 mg by mouth daily.    [provider]  bacitracin ointment Apply topically 2 (two) times daily. Apply to index finger 03/05/20   Maczis, Elmer Sow, PA-C  Calcium Citrate-Vitamin D (CALCIUM + D PO) Take 1 tablet by mouth daily.    [provider]  cholecalciferol (VITAMIN D3) 25 MCG (1000 UNIT) tablet Take 2,000 Units by mouth daily.    [provider]  diazepam (VALIUM) 5 MG tablet Take 0.5-1 tablets (2.5-5 mg total) by mouth every 6 (six) hours as needed for muscle spasms or sedation. Patient not taking:  Reported on 08/31/2016 02/19/16   Shuford, French Ana, PA-C  diclofenac sodium (VOLTAREN) 1 % GEL Apply 1 application topically 3 (three) times daily as needed (pain).     [provider]  Docosahexaenoic Acid (DHA PO) Take 1,000 mg by mouth daily.    [provider]  docusate sodium (COLACE) 100 MG capsule Take 1 capsule (100 mg total) by mouth 2 (two) times daily as needed for mild constipation. 03/05/20   Maczis, Elmer Sow, PA-C  famotidine (PEPCID) 40 MG tablet Take 40 mg by mouth 2 (two) times daily.    [provider]  fenofibrate 160 MG tablet Take 160 mg by mouth at bedtime.    [provider]  ferrous sulfate 325 (65 FE) MG tablet Take 325 mg by mouth daily with breakfast.    [provider]  fexofenadine (ALLEGRA) 60 MG tablet Take 60 mg by mouth as needed for rhinitis (sinus congestion).    [provider]  folic acid (FOLVITE) 1 MG tablet Take 1 mg by mouth daily.    [provider]  gabapentin (NEURONTIN) 100 MG capsule Take 100-200 mg by mouth at bedtime.     [provider]  hydrOXYzine (ATARAX/VISTARIL) 25 MG tablet Take 1 tablet (25 mg total) by mouth every 4 (four) hours as needed for itching.  Patient not taking: Reported on 05/02/2018 10/11/13   Gillian Scarce, MD  loperamide (IMODIUM A-D) 2 MG capsule Take 2 mg by mouth as needed for diarrhea or loose stools (for irritable colon problems).    [provider]  magnesium gluconate (MAGONATE) 500 MG tablet Take 500 mg by mouth daily.    [provider]  Melatonin 10 MG TABS Take 10 mg by mouth at bedtime.    [provider]  Multiple Vitamins-Minerals (MULTIVITAMIN MEN PO) Take 1 tablet by mouth daily.    [provider]  ondansetron (ZOFRAN ODT) 4 MG disintegrating tablet Take 1 tablet (4 mg total) by mouth every 8 (eight) hours as needed for nausea or vomiting. Patient not taking: Reported on 05/02/2018 10/15/16   Alvira Monday, MD   oxyCODONE (OXY IR/ROXICODONE) 5 MG immediate release tablet Take 1 tablet (5 mg total) by mouth every 6 (six) hours as needed for breakthrough pain. 03/05/20   Maczis, Elmer Sow, PA-C  polyethylene glycol (MIRALAX / GLYCOLAX) 17 g packet Take 17 g by mouth daily as needed for mild constipation. 03/05/20   Maczis, Elmer Sow, PA-C  potassium citrate (UROCIT-K) 10 MEQ (1080 MG) SR tablet Take 20 mEq by mouth 2 (two) times daily.    [provider]  pravastatin (PRAVACHOL) 40 MG tablet Take 40 mg by mouth daily.    [provider]  pravastatin (PRAVACHOL) 80 MG tablet Take 1 tablet (80 mg total) by mouth at bedtime. Patient not taking: Reported on 03/04/2020 06/27/13 10/15/16  Gillian Scarce, MD  telmisartan-hydrochlorothiazide (MICARDIS HCT) 40-12.5 MG tablet Take 1 tablet by mouth every morning. 02/01/16   [provider]  Triamcinolone Acetonide (NASACORT ALLERGY 24HR NA) Place 1-2 sprays into the nose daily.     [provider]  vitamin C (ASCORBIC ACID) 500 MG tablet Take 500 mg by mouth daily.    [provider]      Allergies    Benzonatate and Codeine    Review of Systems   Review of Systems  Physical Exam Updated Vital Signs BP (!) 139/90   Pulse (!) 105   Temp 97.8 F (36.6 C) (Oral)   Resp (!) 22   SpO2 95%  Physical Exam Vitals and nursing note reviewed.  Constitutional:      Appearance: He is well-developed. He is not diaphoretic.     Comments: Elderly, frail  HENT:     Head: Normocephalic and atraumatic.     Right Ear: External ear normal.     Left Ear: External ear normal.  Eyes:     General: No scleral icterus.       Right eye: No discharge.        Left eye: No discharge.     Conjunctiva/sclera: Conjunctivae normal.  Neck:     Trachea: No tracheal deviation.  Cardiovascular:     Rate and Rhythm: Normal rate and regular rhythm.  Pulmonary:     Effort: Pulmonary effort is normal. No respiratory distress.     Breath  sounds: Normal breath sounds. No stridor. No wheezing or rales.  Abdominal:     General: Bowel sounds are normal. There is no distension.     Palpations: Abdomen is soft.     Tenderness: There is no abdominal tenderness. There is no guarding or rebound.  Musculoskeletal:        General: No tenderness or deformity.     Cervical back: Neck supple. No tenderness.  Thoracic back: No tenderness.     Lumbar back: No tenderness.     Comments: No ttp bilateral hips, no pain with range of motion  Skin:    General: Skin is warm and dry.     Findings: No rash.  Neurological:     General: No focal deficit present.     Mental Status: He is alert.     Cranial Nerves: No cranial nerve deficit, dysarthria or facial asymmetry.     Sensory: No sensory deficit.     Motor: Weakness present. No abnormal muscle tone or seizure activity.     Coordination: Coordination normal.     Comments: Able lift both legs off the bed although only a few inches, able to hold both arms off the bed, no drift noted, slow to answer questions , somewhat tangential with his responses, no aphasia noted  Psychiatric:        Mood and Affect: Mood normal.     ED Results / Procedures / Treatments   Labs (all labs ordered are listed, but only abnormal results are displayed) Labs Reviewed  CBC - Abnormal; Notable for the following components:      Result Value   WBC 14.9 (*)    RBC 4.00 (*)    HCT 38.7 (*)    All other components within normal limits  DIFFERENTIAL - Abnormal; Notable for the following components:   Neutro Abs 12.0 (*)    Monocytes Absolute 1.9 (*)    All other components within normal limits  COMPREHENSIVE METABOLIC PANEL - Abnormal; Notable for the following components:   CO2 21 (*)    Glucose, Bld 136 (*)    BUN 52 (*)    Creatinine, Ser 2.03 (*)    Calcium 10.6 (*)    AST 74 (*)    Total Bilirubin 1.5 (*)    GFR, Estimated 33 (*)    All other components within normal limits  OCCULT BLOOD X 1  CARD TO LAB, STOOL - Abnormal; Notable for the following components:   Fecal Occult Bld POSITIVE (*)    All other components within normal limits  ETHANOL  URINALYSIS, ROUTINE W REFLEX MICROSCOPIC  OCCULT BLOOD X 1 CARD TO LAB, STOOL    EKG EKG Interpretation Date/Time:  Tuesday September 19 2023 10:43:01 EST Ventricular Rate:  102 PR Interval:  219 QRS Duration:  101 QT Interval:  349 QTC Calculation: 455 R Axis:   24  Text Interpretation: Sinus tachycardia Prolonged PR interval S1,S2,S3 pattern No significant change since last tracing Confirmed by Linwood Dibbles [phone]) on 09/19/2023 10:52:44 AM  Radiology CT Head Wo Contrast Result Date: 09/19/2023 CLINICAL DATA:  Neck trauma.  Multiple falls yesterday. EXAM: CT HEAD WITHOUT CONTRAST CT CERVICAL SPINE WITHOUT CONTRAST TECHNIQUE: Multidetector CT imaging of the head and cervical spine was performed following the standard protocol without intravenous contrast. Multiplanar CT image reconstructions of the cervical spine were also generated. RADIATION DOSE REDUCTION: This exam was performed according to the departmental dose-optimization program which includes automated exposure control, adjustment of the mA and/or kV according to patient size and/or use of iterative reconstruction technique. COMPARISON:  None Available. FINDINGS: CT HEAD FINDINGS Brain: No evidence of acute infarction, hemorrhage, hydrocephalus, extra-axial collection or mass lesion/mass effect. Generalized brain atrophy. Vascular: No hyperdense vessel or unexpected calcification. Skull: Normal. Negative for fracture or focal lesion. Sinuses/Orbits: No acute finding. CT CERVICAL SPINE FINDINGS Alignment: Normal. Skull base and vertebrae: No acute fracture. No primary bone lesion or  focal pathologic process. Chronic T1 spinous process fracture. Soft tissues and spinal canal: No prevertebral fluid or swelling. No visible canal hematoma. Disc levels:  Degenerative facet spurring  asymmetric to the right Upper chest: Clear apical lungs. IMPRESSION: No evidence of acute intracranial or cervical spine injury. Electronically Signed   By: Tiburcio Pea M.D.   On: 09/19/2023 12:17   CT Cervical Spine Wo Contrast Result Date: 09/19/2023 CLINICAL DATA:  Neck trauma.  Multiple falls yesterday. EXAM: CT HEAD WITHOUT CONTRAST CT CERVICAL SPINE WITHOUT CONTRAST TECHNIQUE: Multidetector CT imaging of the head and cervical spine was performed following the standard protocol without intravenous contrast. Multiplanar CT image reconstructions of the cervical spine were also generated. RADIATION DOSE REDUCTION: This exam was performed according to the departmental dose-optimization program which includes automated exposure control, adjustment of the mA and/or kV according to patient size and/or use of iterative reconstruction technique. COMPARISON:  None Available. FINDINGS: CT HEAD FINDINGS Brain: No evidence of acute infarction, hemorrhage, hydrocephalus, extra-axial collection or mass lesion/mass effect. Generalized brain atrophy. Vascular: No hyperdense vessel or unexpected calcification. Skull: Normal. Negative for fracture or focal lesion. Sinuses/Orbits: No acute finding. CT CERVICAL SPINE FINDINGS Alignment: Normal. Skull base and vertebrae: No acute fracture. No primary bone lesion or focal pathologic process. Chronic T1 spinous process fracture. Soft tissues and spinal canal: No prevertebral fluid or swelling. No visible canal hematoma. Disc levels:  Degenerative facet spurring asymmetric to the right Upper chest: Clear apical lungs. IMPRESSION: No evidence of acute intracranial or cervical spine injury. Electronically Signed   By: Tiburcio Pea M.D.   On: 09/19/2023 12:17   DG Pelvis 1-2 Views Result Date: 09/19/2023 CLINICAL DATA:  Multiple falls.  Constipation. EXAM: PELVIS - 1-2 VIEW COMPARISON:  None Available. FINDINGS: No acute fracture or dislocation. Mild osteopenia. The soft  tissues are unremarkable. IMPRESSION: 1. No acute fracture or dislocation. 2. Mild osteopenia. Electronically Signed   By: Elgie Collard M.D.   On: 09/19/2023 12:11    Procedures Procedures    Medications Ordered in ED Medications  sodium chloride 0.9 % bolus 500 mL (500 mLs Intravenous New Bag/Given 09/19/23 1109)    ED Course/ Medical Decision Making/ A&P Clinical Course as of 09/19/23 1518  Tue Sep 19, 2023  1210 Comprehensive metabolic panel(!) Metabolic panel shows elevated BUN and creatinine similar to previous values [JK]  1210 CBC(!) White blood cell count elevated at 14.9 [JK]  1210 800 cc on bladder scan. RN unable to place catheter. Attempted to place coud catheter.  Resistance meant and unable to get any urine return [JK]  1225 Case discussed with Dr. Berneice Heinrich.  Will see the patient in the ED once he arrives at [facility] [JK]  1226 CT normal of head and c spine [JK]  1226 Pelvis no fracture [JK]  1316 Patient did spontaneously urinate when he stood up however still significant amount of urine on postvoid residual [JK]  1327 CAse discussed with Dr Robb Matar regarding admission [JK]    Clinical Course User Index [JK] Linwood Dibbles, MD                                 Medical Decision Making Problems Addressed: Confusion: acute illness or injury that poses a threat to life or bodily functions Near syncope: acute illness or injury that poses a threat to life or bodily functions Urinary retention: acute illness or injury that poses a threat  to life or bodily functions  Amount and/or Complexity of Data Reviewed Labs: ordered. Decision-making details documented in ED Course. Radiology: ordered and independent interpretation performed.  Risk Decision regarding hospitalization.   Patient presented to the ED for evaluation of confusion and falls possible syncope.  Family is also noted the patient's been elevated more confused.  Patient is alert and awake and answering  questions but he is slower to respond and is more tangential with his speech.  He does have a leukocytosis concerning for the possibility of infection.  No fever here.  No hypotension.  No signs of anemia.  Patient does have elements of elevated BUN and creatinine but this appears to be chronic in nature.  Patient's head CT and C-spine CT did not show any acute abnormalities.  Pelvis films without signs of fracture.  Patient indicated he was having difficulty urinating.  Bladder scan was performed and he does have evidence of urinary retention.  We were unable to place a Foley catheter.  I have consulted with Dr. Ferrel Logan urology and we will plan on transfer to [facility], ED for Foley catheter insertion.  I do think patient requires admission to the hospital for further evaluation for his syncope and confusion.  He may benefit from MRI.  Is also possible he has a urinary tract infection causing his symptoms.  We will assess for urinary tract infection when we are able to get a urine sample when the catheter is placed.  With the elevated white blood cell count and Hemoccult positive stools will CT to evaluate for the possibility of diverticulitis although right now his abdomen is soft and benign.        Final Clinical Impression(s) / ED Diagnoses Final diagnoses:  Near syncope  Confusion  Urinary retention    Rx / DC Orders ED Discharge Orders     None         Linwood Dibbles, MD 09/19/23 [phone]

## 2023-09-19 NOTE — Assessment & Plan Note (Signed)
BP normal -Continue metoprolol

## 2023-09-19 NOTE — Hospital Course (Addendum)
78 y.o. 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  In the ER, WBC 14K, had urinary retention again.  Nursing unable to place foley.

## 2023-09-19 NOTE — Assessment & Plan Note (Addendum)
Cr stable relative to baseline since last May, 2.1-2.2.  He was recently taken off Atacand and referred to Nephrology.

## 2023-09-19 NOTE — ED Notes (Signed)
Post void: 622 ml in bladder

## 2023-09-19 NOTE — Assessment & Plan Note (Addendum)
Incidental finding. CT does show some colitis vs thickening but no melena/hematochezia reported or observed.  Hgb stable.  - Outpatient GI follow up

## 2023-09-20 ENCOUNTER — Encounter (HOSPITAL_COMMUNITY): Payer: Self-pay | Admitting: *Deleted

## 2023-09-20 DIAGNOSIS — I129 Hypertensive chronic kidney disease with stage 1 through stage 4 chronic kidney disease, or unspecified chronic kidney disease: Secondary | ICD-10-CM | POA: Diagnosis present

## 2023-09-20 DIAGNOSIS — G4733 Obstructive sleep apnea (adult) (pediatric): Secondary | ICD-10-CM | POA: Diagnosis present

## 2023-09-20 DIAGNOSIS — K5900 Constipation, unspecified: Secondary | ICD-10-CM | POA: Diagnosis present

## 2023-09-20 DIAGNOSIS — R339 Retention of urine, unspecified: Secondary | ICD-10-CM | POA: Diagnosis present

## 2023-09-20 DIAGNOSIS — Z8249 Family history of ischemic heart disease and other diseases of the circulatory system: Secondary | ICD-10-CM | POA: Diagnosis not present

## 2023-09-20 DIAGNOSIS — Z83438 Family history of other disorder of lipoprotein metabolism and other lipidemia: Secondary | ICD-10-CM | POA: Diagnosis not present

## 2023-09-20 DIAGNOSIS — Z96611 Presence of right artificial shoulder joint: Secondary | ICD-10-CM | POA: Diagnosis present

## 2023-09-20 DIAGNOSIS — B952 Enterococcus as the cause of diseases classified elsewhere: Secondary | ICD-10-CM | POA: Diagnosis present

## 2023-09-20 DIAGNOSIS — K449 Diaphragmatic hernia without obstruction or gangrene: Secondary | ICD-10-CM | POA: Diagnosis present

## 2023-09-20 DIAGNOSIS — N1832 Chronic kidney disease, stage 3b: Secondary | ICD-10-CM | POA: Diagnosis present

## 2023-09-20 DIAGNOSIS — G9341 Metabolic encephalopathy: Secondary | ICD-10-CM | POA: Diagnosis present

## 2023-09-20 DIAGNOSIS — K2981 Duodenitis with bleeding: Secondary | ICD-10-CM | POA: Diagnosis present

## 2023-09-20 DIAGNOSIS — E7800 Pure hypercholesterolemia, unspecified: Secondary | ICD-10-CM | POA: Diagnosis present

## 2023-09-20 DIAGNOSIS — Z87442 Personal history of urinary calculi: Secondary | ICD-10-CM | POA: Diagnosis not present

## 2023-09-20 DIAGNOSIS — N3000 Acute cystitis without hematuria: Secondary | ICD-10-CM | POA: Diagnosis not present

## 2023-09-20 DIAGNOSIS — Z79899 Other long term (current) drug therapy: Secondary | ICD-10-CM | POA: Diagnosis not present

## 2023-09-20 DIAGNOSIS — I10 Essential (primary) hypertension: Secondary | ICD-10-CM | POA: Diagnosis not present

## 2023-09-20 DIAGNOSIS — Z820 Family history of epilepsy and other diseases of the nervous system: Secondary | ICD-10-CM | POA: Diagnosis not present

## 2023-09-20 DIAGNOSIS — K529 Noninfective gastroenteritis and colitis, unspecified: Secondary | ICD-10-CM | POA: Diagnosis present

## 2023-09-20 DIAGNOSIS — Z888 Allergy status to other drugs, medicaments and biological substances status: Secondary | ICD-10-CM | POA: Diagnosis not present

## 2023-09-20 DIAGNOSIS — N390 Urinary tract infection, site not specified: Secondary | ICD-10-CM | POA: Diagnosis present

## 2023-09-20 DIAGNOSIS — Z79890 Hormone replacement therapy (postmenopausal): Secondary | ICD-10-CM | POA: Diagnosis not present

## 2023-09-20 DIAGNOSIS — G3184 Mild cognitive impairment, so stated: Secondary | ICD-10-CM | POA: Diagnosis present

## 2023-09-20 DIAGNOSIS — H9190 Unspecified hearing loss, unspecified ear: Secondary | ICD-10-CM | POA: Diagnosis present

## 2023-09-20 DIAGNOSIS — K2971 Gastritis, unspecified, with bleeding: Secondary | ICD-10-CM | POA: Diagnosis present

## 2023-09-20 DIAGNOSIS — K2970 Gastritis, unspecified, without bleeding: Secondary | ICD-10-CM | POA: Diagnosis not present

## 2023-09-20 DIAGNOSIS — Z885 Allergy status to narcotic agent status: Secondary | ICD-10-CM | POA: Diagnosis not present

## 2023-09-20 DIAGNOSIS — D631 Anemia in chronic kidney disease: Secondary | ICD-10-CM | POA: Diagnosis present

## 2023-09-20 DIAGNOSIS — D649 Anemia, unspecified: Secondary | ICD-10-CM | POA: Diagnosis not present

## 2023-09-20 DIAGNOSIS — R338 Other retention of urine: Secondary | ICD-10-CM | POA: Diagnosis not present

## 2023-09-20 LAB — BASIC METABOLIC PANEL
Anion gap: 10 (ref 5–15)
BUN: 50 mg/dL — ABNORMAL HIGH (ref 8–23)
CO2: 22 mmol/L (ref 22–32)
Calcium: 9.1 mg/dL (ref 8.9–10.3)
Chloride: 107 mmol/L (ref 98–111)
Creatinine, Ser: 1.43 mg/dL — ABNORMAL HIGH (ref 0.61–1.24)
GFR, Estimated: 50 mL/min — ABNORMAL LOW (ref >60)
Glucose, Bld: 106 mg/dL — ABNORMAL HIGH (ref 70–99)
Potassium: 3.6 mmol/L (ref 3.5–5.1)
Sodium: 139 mmol/L (ref 135–145)

## 2023-09-20 LAB — CBC
HCT: 35.7 % — ABNORMAL LOW (ref 39.0–52.0)
Hemoglobin: 11.8 g/dL — ABNORMAL LOW (ref 13.0–17.0)
MCH: 33.8 pg (ref 26.0–34.0)
MCHC: 33.1 g/dL (ref 30.0–36.0)
MCV: 102.3 fL — ABNORMAL HIGH (ref 80.0–100.0)
Platelets: 244 10*3/uL (ref 150–400)
RBC: 3.49 MIL/uL — ABNORMAL LOW (ref 4.22–5.81)
RDW: 12.8 % (ref 11.5–15.5)
WBC: 9.9 10*3/uL (ref 4.0–10.5)
nRBC: 0 % (ref 0.0–0.2)

## 2023-09-20 MED ORDER — DICLOFENAC SODIUM 1 % EX GEL
2.0000 g | Freq: Three times a day (TID) | CUTANEOUS | Status: DC | PRN
  Administered 2023-09-22: 2 g via TOPICAL
  Filled 2023-09-20: qty 100

## 2023-09-20 MED ORDER — PANTOPRAZOLE SODIUM 40 MG IV SOLR
40.0000 mg | Freq: Two times a day (BID) | INTRAVENOUS | Status: DC
  Administered 2023-09-20 – 2023-09-22 (×6): 40 mg via INTRAVENOUS
  Filled 2023-09-20 (×7): qty 10

## 2023-09-20 MED ORDER — CHLORHEXIDINE GLUCONATE CLOTH 2 % EX PADS
6.0000 | MEDICATED_PAD | Freq: Every day | CUTANEOUS | Status: DC
  Administered 2023-09-21 – 2023-09-24 (×3): 6 via TOPICAL

## 2023-09-20 MED ORDER — SENNOSIDES-DOCUSATE SODIUM 8.6-50 MG PO TABS
1.0000 | ORAL_TABLET | Freq: Once | ORAL | Status: AC
  Administered 2023-09-20: 1 via ORAL
  Filled 2023-09-20: qty 1

## 2023-09-20 NOTE — Progress Notes (Signed)
   09/20/23 0035  BiPAP/CPAP/SIPAP  $ Non-Invasive Home Ventilator  Initial  $ Face Mask Large  Yes  BiPAP/CPAP/SIPAP Pt Type Adult  BiPAP/CPAP/SIPAP Resmed  Mask Type Full face mask (per home regimen, per pt)  Mask Size Large  Respiratory Rate 20 breaths/min  FiO2 (%) 21 %  Patient Home Equipment No  Auto Titrate Yes (automode, min4cm, max20cm h20)  CPAP/SIPAP surface wiped down Yes  BiPAP/CPAP /SiPAP Vitals  Pulse Rate 75  Resp 20  SpO2 99 %
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
## 2023-09-20 NOTE — Progress Notes (Signed)
  Progress Note   Patient: Jon Bush ZOX:[id number] DOB: [birth year] DOA: 09/19/2023     0 DOS: the patient was seen and examined on 09/20/2023 at 8:53AM      Brief hospital course: 78 y.o. 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  In the ER, WBC 14K, had urinary retention again.  Nursing unable to place foley.  Also noted black "paint-like" stool prior to admission.     Assessment and Plan: * UTI (urinary tract infection) Blood and urine cultures no growth to date - Continue Rocephin - Stop Flagyl, do not suspect diverticulitis    Melena Initially I was planning outpatient GI follow-up, but today the patient has had a 2 point drop in hemoglobin, and is able to report that he had a "black pain like stool" prior to admission, and nursing noted that he had a dark stool here. - Stop PPx lovenox - Start IV PPI - Consult GI, appreciate expertise  Acute urinary retention History urethral stricture s/p DVIU (for urethral stricture?) BPH s/p PVP  Urology were consulted and placed a foley.  They recommend continue at discharge and follow with Dr. Kyla Balzarine Urology for voiding trial after discharge - Maintain foley  Acute metabolic encephalopathy Mild cognitive impairment Patient is in middle of work up for cognitive impairment with his Neurologist at [facility].  Here, he was very confused and disoriented initially, this has improved back to his recent baseline.  OSA (obstructive sleep apnea) - CPAP at night  CKD (chronic kidney disease), stage IIIb (HCC) Cr stable relative to baseline since last May, 2.1-2.2.  He was recently taken off Atacand and referred to Nephrology. Cr down to 1.4 today with fluids and holding Atacand.  Hypercalcemia Resolved  Essential hypertension BP normal - Continue metoprolol  Hyperlipidemia - Continue pravastatin, fibrate            Subjective: Patient  had one BM today, constipation resolved.  This BM was "dark" per nursing.  No fever.  His confusion is better.  No vomiting.       Physical Exam: BP 134/63 (BP Location: Right Arm)   Pulse 69   Temp 98 F (36.7 C) (Oral)   Resp 20   Ht 5\' 9"  (1.753 m)   Wt 88.9 kg   SpO2 96%   BMI 28.94 kg/m   Adult male, lying in bed, interactive and appropriate RRR, no murmurs, trace peripheral edema Respiratory rate normal, lungs clear without rales or wheezes Abdomen soft without tenderness palpation or guarding in any quadrant Attention normal, responds to questions, he tends to be rambling and tangential, but he is oriented to person, place, and time which she was not yesterday    Data Reviewed: Discussed with gastroenterology CBC shows no anemia Creatinine down to 1.4, sodium and potassium normal  Family Communication: Daughter at the bedside    Disposition: Status is: Inpatient The patient was admitted for leukocytosis and urinary retention, suspected UTI  He is improved but still quite weak and unsteady and so we will continue IV antibiotics for an additional day  In the meantime, he is able to point drop in hemoglobin, abnormal require IV PPI and evaluation by GI          Author: Alberteen Sam, MD 09/20/2023 4:55 PM  For on call review [email or web address].

## 2023-09-20 NOTE — Evaluation (Signed)
Occupational Therapy Evaluation Patient Details Name: Jon Bush MRN: [id number] DOB: Oct 03, 1945 Today's Date: 09/20/2023   History of Present Illness Patient is a 78 year old male who presented after falls, confusion, and decreased ability to urinate. Patient was admitted with UTI, metabolic encephalopathy, constipation, and positive fecal occult blood test. PMH: OSA, HTN, hyperlipidemia, CKD III, BPH, h/o urethral stricture.   Clinical Impression   Patient is a 79 year old male who was admitted for above. Patient was living at home alone with daughter living near by. Patient was confused during session with patient taking increased time to answer questions and needing cues for initiation and sequencing of tasks. Daughter was present reporting that she can help PRN but not available 24/7 to assist patient. Patient was noted to have decreased functional activity tolerance, decreased endurance, decreased standing balance, decreased safety awareness, and decreased knowledge of AD/AE impacting participation in ADLs. Patient would continue to benefit from skilled OT services at this time while admitted and after d/c to address noted deficits in order to improve overall safety and independence in ADLs. Patient will benefit from continued inpatient follow up therapy, <3 hours/day.         If plan is discharge home, recommend the following: A little help with walking and/or transfers;A little help with bathing/dressing/bathroom;Assistance with cooking/housework;Direct supervision/assist for medications management;Assist for transportation;Supervision due to cognitive status;Help with stairs or ramp for entrance;Direct supervision/assist for financial management    Functional Status Assessment  Patient has had a recent decline in their functional status and demonstrates the ability to make significant improvements in function in a reasonable and predictable amount of time.  Equipment  Recommendations  None recommended by OT       Precautions / Restrictions Precautions Precautions: Fall Restrictions Weight Bearing Restrictions Per Provider Order: No      Mobility Bed Mobility Overal bed mobility: Needs Assistance Bed Mobility: Supine to Sit, Sit to Supine     Supine to sit: Contact guard Sit to supine: Supervision   General bed mobility comments: with increased time,            Balance Overall balance assessment: No apparent balance deficits (not formally assessed)           ADL either performed or assessed with clinical judgement   ADL Overall ADL's : Needs assistance/impaired Eating/Feeding: Modified independent;Sitting   Grooming: Sitting;Set up   Upper Body Bathing: Sitting;Set up   Lower Body Bathing: Sitting/lateral leans;Maximal assistance   Upper Body Dressing : Sitting;Minimal assistance   Lower Body Dressing: Sitting/lateral leans;Maximal assistance   Toilet Transfer: Minimal assistance;Ambulation;Rolling walker (2 wheels) Toilet Transfer Details (indicate cue type and reason): functional mobility into hallway Toileting- Clothing Manipulation and Hygiene: Sit to/from stand;Maximal assistance               Vision Baseline Vision/History: 1 Wears glasses              Pertinent Vitals/Pain Pain Assessment Pain Assessment: No/denies pain     Extremity/Trunk Assessment Upper Extremity Assessment Upper Extremity Assessment: Overall WFL for tasks assessed       Cervical / Trunk Assessment Cervical / Trunk Assessment: Kyphotic;Other exceptions Cervical / Trunk Exceptions: patient noted to have cervical positioning to R side   Communication Communication Communication: No apparent difficulties   Cognition Arousal: Alert Behavior During Therapy: WFL for tasks assessed/performed Overall Cognitive Status: Difficult to assess               General Comments: confusion  with answering questions able to report  name and birthday with increased time. patient needing cues for initation and sequencing of task.                Home Living Family/patient expects to be discharged to:: Private residence Living Arrangements: Alone Available Help at Discharge: Family;Available PRN/intermittently Type of Home: House Home Access: Level entry     Home Layout: One level     Bathroom Shower/Tub: Runner, broadcasting/film/video: None          Prior Functioning/Environment Prior Level of Function : Independent/Modified Independent                        OT Problem List: Decreased activity tolerance;Impaired balance (sitting and/or standing);Decreased safety awareness;Decreased knowledge of precautions;Decreased coordination;Decreased knowledge of use of DME or AE      OT Treatment/Interventions: Self-care/ADL training;Energy conservation;Therapeutic exercise;DME and/or AE instruction;Therapeutic activities;Patient/family education    OT Goals(Current goals can be found in the care plan section) Acute Rehab OT Goals Patient Stated Goal: to get back home OT Goal Formulation: With patient/family Time For Goal Achievement: 10/04/23 Potential to Achieve Goals: Fair  OT Frequency: Min 1X/week    Co-evaluation PT/OT/SLP Co-Evaluation/Treatment: Yes Reason for Co-Treatment: For patient/therapist safety PT goals addressed during session: Mobility/safety with mobility OT goals addressed during session: ADL's and self-care      AM-PAC OT "6 Clicks" Daily Activity     Outcome Measure Help from another person eating meals?: A Little Help from another person taking care of personal grooming?: A Little Help from another person toileting, which includes using toliet, bedpan, or urinal?: A Little Help from another person bathing (including washing, rinsing, drying)?: A Little Help from another person to put on and taking off regular upper body clothing?: A Little Help from another  person to put on and taking off regular lower body clothing?: A Lot 6 Click Score: 17   End of Session Equipment Utilized During Treatment: Gait belt;Rolling walker (2 wheels);Other (comment) (BSC) Nurse Communication: Mobility status  Activity Tolerance: Patient tolerated treatment well Patient left: in bed;with call bell/phone within reach (in ED)  OT Visit Diagnosis: Unsteadiness on feet (R26.81);Other abnormalities of gait and mobility (R26.89)                Time: 1610-9604 OT Time Calculation (min): 20 min Charges:  OT General Charges $OT Visit: 1 Visit OT Evaluation $OT Eval Low Complexity: 1 Low  Jon Bush OTR/L, MS Acute Rehabilitation Department Office# [phone]   Jon Bush 09/20/2023, 11:22 AM

## 2023-09-20 NOTE — Progress Notes (Signed)
   09/20/23 2236  BiPAP/CPAP/SIPAP  BiPAP/CPAP/SIPAP Pt Type Adult  BiPAP/CPAP/SIPAP Resmed  Mask Type Full face mask  Mask Size Large  Respiratory Rate 20 breaths/min  FiO2 (%) 21 %  Patient Home Equipment No  Auto Titrate Yes (automode, min4cm, max20cm h2o)  CPAP/SIPAP surface wiped down Yes  BiPAP/CPAP /SiPAP Vitals  Pulse Rate 64  Resp 20  SpO2 96 %

## 2023-09-20 NOTE — Evaluation (Signed)
Physical Therapy Evaluation Patient Details Name: Jon Bush MRN: [id number] DOB: 06/22/1946 Today's Date: 09/20/2023  History of Present Illness  Patient is a 78 year old male who presented after falls, confusion, and decreased ability to urinate. Patient was admitted with UTI, metabolic encephalopathy, constipation, and positive fecal occult blood test. PMH: OSA, HTN, hyperlipidemia, CKD III, BPH, h/o urethral stricture.  Clinical Impression  Pt admitted with above diagnosis.  Pt currently with functional limitations due to the deficits listed below (see PT Problem List). Pt will benefit from acute skilled PT to increase their independence and safety with mobility to allow discharge.     The patient reporting feeling constipated,assisted to [facility], with no success.    Patient ambulated  x 100' using RW,  also ambulated without  RW, gait less steady.  Patient lives aline and is independent and driving, daughter works. Patient will benefit from continued inpatient follow up therapy, <3 hours/day Unless progresses to level to care for self safely.       If plan is discharge home, recommend the following: A little help with walking and/or transfers;Assistance with cooking/housework;Assist for transportation   Can travel by private vehicle   Yes    Equipment Recommendations None recommended by PT  Recommendations for Other Services       Functional Status Assessment Patient has had a recent decline in their functional status and demonstrates the ability to make significant improvements in function in a reasonable and predictable amount of time.     Precautions / Restrictions Precautions Precautions: Fall Restrictions Weight Bearing Restrictions Per Provider Order: No      Mobility  Bed Mobility                    Transfers Overall transfer level: Needs assistance Equipment used: Rolling walker (2 wheels), None Transfers: Sit to/from Stand, Bed to  chair/wheelchair/BSC Sit to Stand: Contact guard assist   Step pivot transfers: Contact guard assist       General transfer comment: patient stand and pivots to [facility], encouraged use of RW    Ambulation/Gait Ambulation/Gait assistance: Contact guard assist Gait Distance (Feet): 100 Feet Assistive device: Rolling walker (2 wheels) Gait Pattern/deviations: Step-through pattern Gait velocity: decr     General Gait Details: patient manages  RW, ambulated x ~ 10' without Rw, noted gait less steady, wide base  Stairs            Wheelchair Mobility     Tilt Bed    Modified Rankin (Stroke Patients Only)       Balance Overall balance assessment: Mild deficits observed, not formally tested                                           Pertinent Vitals/Pain Pain Assessment Pain Assessment: No/denies pain    Home Living Family/patient expects to be discharged to:: Private residence Living Arrangements: Alone Available Help at Discharge: Family;Available PRN/intermittently Type of Home: House Home Access: Level entry       Home Layout: One level Home Equipment: None      Prior Function Prior Level of Function : Independent/Modified Independent;Driving                     Extremity/Trunk Assessment   Upper Extremity Assessment Upper Extremity Assessment: Defer to OT evaluation    Lower Extremity Assessment Lower Extremity Assessment:  Overall [facility] for tasks assessed;Generalized weakness    Cervical / Trunk Assessment Cervical / Trunk Assessment: Kyphotic;Other exceptions Cervical / Trunk Exceptions: patient noted to have cervical positioning to R side  Communication   Communication Communication: Hearing impairment  Cognition Arousal: Alert Behavior During Therapy: WFL for tasks assessed/performed Overall Cognitive Status: Difficult to assess                                 General Comments: confusion with answering  questions able to report name and birthday with increased time. patient needing cues for initation and sequencing of task.        General Comments      Exercises     Assessment/Plan    PT Assessment Patient needs continued PT services  PT Problem List Decreased strength;Decreased balance;Decreased cognition;Decreased knowledge of precautions;Decreased mobility;Decreased activity tolerance       PT Treatment Interventions DME instruction;Therapeutic activities;Gait training;Therapeutic exercise;Patient/family education;Functional mobility training    PT Goals (Current goals can be found in the Care Plan section)  Acute Rehab PT Goals Patient Stated Goal: to go home PT Goal Formulation: With patient/family Time For Goal Achievement: 10/04/23 Potential to Achieve Goals: Good    Frequency Min 1X/week     Co-evaluation PT/OT/SLP Co-Evaluation/Treatment: Yes Reason for Co-Treatment: For patient/therapist safety PT goals addressed during session: Mobility/safety with mobility OT goals addressed during session: ADL's and self-care       AM-PAC PT "6 Clicks" Mobility  Outcome Measure Help needed turning from your back to your side while in a flat bed without using bedrails?: None Help needed moving from lying on your back to sitting on the side of a flat bed without using bedrails?: None Help needed moving to and from a bed to a chair (including a wheelchair)?: None Help needed standing up from a chair using your arms (e.g., wheelchair or bedside chair)?: A Little Help needed to walk in hospital room?: A Little Help needed climbing 3-5 steps with a railing? : A Lot 6 Click Score: 20    End of Session Equipment Utilized During Treatment: Gait belt Activity Tolerance: Patient tolerated treatment well Patient left: in bed;with call bell/phone within reach;with family/visitor present Nurse Communication: Mobility status PT Visit Diagnosis: Unsteadiness on feet (R26.81);Muscle  weakness (generalized) (M62.81);Difficulty in walking, not elsewhere classified (R26.2);Other symptoms and signs involving the nervous system (R29.898)    Time: 9604-5409 PT Time Calculation (min) (ACUTE ONLY): 21 min   Charges:   PT Evaluation $PT Eval Low Complexity: 1 Low   PT General Charges $$ ACUTE PT VISIT: 1 Visit         Blanchard Kelch PT Acute Rehabilitation Services Office [phone] Weekend pager-[phone]   Rada Hay 09/20/2023, 1:26 PM
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
## 2023-09-21 ENCOUNTER — Encounter (HOSPITAL_COMMUNITY): Payer: Self-pay | Admitting: Family Medicine

## 2023-09-21 ENCOUNTER — Inpatient Hospital Stay (HOSPITAL_COMMUNITY): Payer: Medicare Other | Admitting: Anesthesiology

## 2023-09-21 ENCOUNTER — Encounter (HOSPITAL_COMMUNITY)
Admission: EM | Disposition: A | Discharge status: Skilled Nursing Facility | Payer: Self-pay | Source: Home / Self Care | Attending: Internal Medicine

## 2023-09-21 DIAGNOSIS — E785 Hyperlipidemia, unspecified: Secondary | ICD-10-CM

## 2023-09-21 DIAGNOSIS — K449 Diaphragmatic hernia without obstruction or gangrene: Secondary | ICD-10-CM | POA: Diagnosis not present

## 2023-09-21 DIAGNOSIS — K2970 Gastritis, unspecified, without bleeding: Secondary | ICD-10-CM | POA: Diagnosis not present

## 2023-09-21 DIAGNOSIS — D649 Anemia, unspecified: Secondary | ICD-10-CM | POA: Diagnosis not present

## 2023-09-21 DIAGNOSIS — N3000 Acute cystitis without hematuria: Secondary | ICD-10-CM | POA: Diagnosis not present

## 2023-09-21 DIAGNOSIS — R338 Other retention of urine: Secondary | ICD-10-CM | POA: Diagnosis not present

## 2023-09-21 DIAGNOSIS — N1832 Chronic kidney disease, stage 3b: Secondary | ICD-10-CM | POA: Diagnosis not present

## 2023-09-21 DIAGNOSIS — I10 Essential (primary) hypertension: Secondary | ICD-10-CM

## 2023-09-21 HISTORY — PX: BIOPSY: SHX5522

## 2023-09-21 HISTORY — PX: GIVENS CAPSULE STUDY: SHX5432

## 2023-09-21 HISTORY — PX: ESOPHAGOGASTRODUODENOSCOPY: SHX5428

## 2023-09-21 LAB — BASIC METABOLIC PANEL
Anion gap: 8 (ref 5–15)
BUN: 35 mg/dL — ABNORMAL HIGH (ref 8–23)
CO2: 25 mmol/L (ref 22–32)
Calcium: 9.4 mg/dL (ref 8.9–10.3)
Chloride: 103 mmol/L (ref 98–111)
Creatinine, Ser: 1.33 mg/dL — ABNORMAL HIGH (ref 0.61–1.24)
GFR, Estimated: 54 mL/min — ABNORMAL LOW (ref >60)
Glucose, Bld: 114 mg/dL — ABNORMAL HIGH (ref 70–99)
Potassium: 3.8 mmol/L (ref 3.5–5.1)
Sodium: 136 mmol/L (ref 135–145)

## 2023-09-21 LAB — CBC
HCT: 34.2 % — ABNORMAL LOW (ref 39.0–52.0)
Hemoglobin: 11.5 g/dL — ABNORMAL LOW (ref 13.0–17.0)
MCH: 33.5 pg (ref 26.0–34.0)
MCHC: 33.6 g/dL (ref 30.0–36.0)
MCV: 99.7 fL (ref 80.0–100.0)
Platelets: 266 10*3/uL (ref 150–400)
RBC: 3.43 MIL/uL — ABNORMAL LOW (ref 4.22–5.81)
RDW: 12.5 % (ref 11.5–15.5)
WBC: 7.6 10*3/uL (ref 4.0–10.5)
nRBC: 0 % (ref 0.0–0.2)

## 2023-09-21 SURGERY — EGD (ESOPHAGOGASTRODUODENOSCOPY)
Anesthesia: Monitor Anesthesia Care

## 2023-09-21 MED ORDER — SENNOSIDES-DOCUSATE SODIUM 8.6-50 MG PO TABS
1.0000 | ORAL_TABLET | Freq: Two times a day (BID) | ORAL | Status: DC
  Administered 2023-09-21 – 2023-09-24 (×7): 1 via ORAL
  Filled 2023-09-21 (×8): qty 1

## 2023-09-21 MED ORDER — PROPOFOL 500 MG/50ML IV EMUL
INTRAVENOUS | Status: DC | PRN
  Administered 2023-09-21: 80 ug/kg/min via INTRAVENOUS

## 2023-09-21 MED ORDER — POLYETHYLENE GLYCOL 3350 17 G PO PACK
17.0000 g | PACK | Freq: Two times a day (BID) | ORAL | Status: DC
  Administered 2023-09-21 – 2023-09-24 (×6): 17 g via ORAL
  Filled 2023-09-21 (×7): qty 1

## 2023-09-21 MED ORDER — FUROSEMIDE 10 MG/ML IJ SOLN
40.0000 mg | Freq: Once | INTRAMUSCULAR | Status: AC
  Administered 2023-09-21: 40 mg via INTRAVENOUS
  Filled 2023-09-21: qty 4

## 2023-09-21 MED ORDER — AMOXICILLIN 500 MG PO CAPS
500.0000 mg | ORAL_CAPSULE | Freq: Three times a day (TID) | ORAL | Status: DC
  Administered 2023-09-21 – 2023-09-22 (×3): 500 mg via ORAL
  Filled 2023-09-21 (×5): qty 1

## 2023-09-21 MED ORDER — LIDOCAINE HCL (PF) 2 % IJ SOLN
INTRAMUSCULAR | Status: DC | PRN
  Administered 2023-09-21: 100 mg via INTRADERMAL

## 2023-09-21 MED ORDER — SODIUM CHLORIDE 0.9 % IV SOLN: INTRAVENOUS | Status: DC | PRN

## 2023-09-21 NOTE — Anesthesia Preprocedure Evaluation (Addendum)
Anesthesia Evaluation  Patient identified by MRN, date of birth, ID band Patient awake    Reviewed: Allergy & Precautions, NPO status , Patient's Chart, lab work & pertinent test results, reviewed documented beta blocker date and time   Airway Mallampati: IV  TM Distance: >3 FB Neck ROM: Full    Dental  (+) Teeth Intact, Dental Advisory Given   Pulmonary sleep apnea and Continuous Positive Airway Pressure Ventilation    Pulmonary exam normal breath sounds clear to auscultation       Cardiovascular hypertension (164/76 preop), Pt. on medications and Pt. on home beta blockers Normal cardiovascular exam Rhythm:Regular Rate:Normal     Neuro/Psych  Headaches PSYCHIATRIC DISORDERS     Dementia    GI/Hepatic Neg liver ROS,GERD  Controlled,,Melena, anemia    Endo/Other  negative endocrine ROS    Renal/GU Renal Insufficiency and CRFRenal diseaseCr 1.43 Acute urinary retention, hx urethral stricture- now s/p foley placement   negative genitourinary   Musculoskeletal  (+) Arthritis , Osteoarthritis,    Abdominal   Peds  Hematology  (+) Blood dyscrasia, anemia Hb 11.8, plt 244   Anesthesia Other Findings   Reproductive/Obstetrics negative OB ROS                             Anesthesia Physical Anesthesia Plan  ASA: 3  Anesthesia Plan: MAC   Post-op Pain Management:    Induction:   PONV Risk Score and Plan: 2 and Propofol infusion and TIVA  Airway Management Planned: Natural Airway and Simple Face Mask  Additional Equipment: None  Intra-op Plan:   Post-operative Plan:   Informed Consent: I have reviewed the patients History and Physical, chart, labs and discussed the procedure including the risks, benefits and alternatives for the proposed anesthesia with the patient or authorized representative who has indicated his/her understanding and acceptance.       Plan Discussed with:  CRNA  Anesthesia Plan Comments:         Anesthesia Quick Evaluation

## 2023-09-21 NOTE — Progress Notes (Addendum)
PROGRESS NOTE    Jon Bush  GNF:[id number] DOB: [birth year] DOA: 09/19/2023 PCP: Madaline Guthrie, MD   Brief Narrative:  78 y.o. M with HTN, HLD, CKD IIIb baseline 2.1-2.2, OSA on CPAP and urinary retention who presented from home after multiple falls.  Patient found to have urinary retention, difficulty placing Foley, urology consulted for Foley placement with plan for outpatient follow-up.  Notable black stool noted at intake concerning for GI bleed with FOBT positive labs.  At this time patient is tentatively planned for upper endoscopy to rule out source for melena, lower endoscopy last September notable for polyps and hemorrhoids as well as diverticulosis.  Pending results of upper endoscopy patient may require pill endoscopy versus outpatient follow-up.  In the interim patient urine culture notably positive for Enterococcus faecalis, transition from ceftriaxone to amoxicillin for 3-day course.  Disposition pending findings as above.  Assessment & Plan:   Principal Problem:   UTI (urinary tract infection) Active Problems:   Acute urinary retention   Hyperlipidemia   Essential hypertension   Hypercalcemia   CKD (chronic kidney disease), stage IIIb (HCC)   OSA (obstructive sleep apnea)   Positive fecal occult blood test   Acute on chronic normocytic anemia  Rule out GI bleed given melena Chronic anemia of chronic disease noted -GI consulted given hemoglobin drop with ongoing black stool -Upper endoscopy planned later today, may require capsule endoscopy if negative, unclear if this will need to be inpatient versus outpatient -Recent colonoscopy 9/24 notable for polyps hemorrhoids and diverticulosis, deferred to GI for possible need for repeat given findings -Hold anticoagulation, continue PPI -N.p.o. for now, likely advance diet after endoscopy per GI   UTI, POA secondary to Enterococcus faecalis Patient does not meet sepsis criteria, DC ceftriaxone, transition to  amoxicillin for 3-day course per sensitivities  Acute urinary retention History urethral stricture s/p DVIU (for urethral stricture?) BPH s/p PVP  Urology were consulted and placed a foley.  Outpatient follow-up with Dr. Kyla Balzarine Urology for voiding trial after discharge Continue Foley at discharge   Acute metabolic encephalopathy, resolving Mild cognitive impairment -Likely secondary to UTI, appears to be approaching baseline -Baseline cognitive impairment per neurologist at [facility] -recommend outpatient follow-up   OSA (obstructive sleep apnea) -CPAP at night   CKD (chronic kidney disease), stage IIIb (HCC) Volume overload -Baseline 2.1 -Without AKI -Lasix 40IV x1 given BLE edema - likely secondary to volume overload/IVF at intake   Hypercalcemia -Resolved   Essential hypertension -BP normal -Continue metoprolol   Hyperlipidemia -Continue pravastatin, fibrate  DVT prophylaxis: Place and maintain sequential compression device Start: 09/20/23 1629 Code Status:   Code Status: Full Code Family Communication: None present  Status is: Inpatient Dispo: The patient is from: Home              Anticipated d/c is to: To be determined              Anticipated d/c date is: 24 to 48 hours              Patient currently not medically stable for discharge  Consultants:  GI  Procedures:  Upper endoscopy, tentative plan for pill endoscopy per GI  Antimicrobials:  Amoxicillin, stop date 09/24/2023  Subjective: No acute issues or events overnight denies nausea vomiting diarrhea constipation headache fevers chills or chest pain.  Objective: Vitals:   09/20/23 2129 09/20/23 2236 09/21/23 0036 09/21/23 0524  BP: (!) 147/69  (!) 148/66 (!) 145/62  Pulse: (!) 59 64  71 (!) 56  Resp: 16 20 13 17   Temp: (!) 97.5 F (36.4 C)  98 F (36.7 C) 97.7 F (36.5 C)  TempSrc: Oral  Oral Oral  SpO2: 97% 96% 97% 97%  Weight:      Height:        Intake/Output Summary (Last 24  hours) at 09/21/2023 0729 Last data filed at 09/20/2023 1800 Gross per 24 hour  Intake 1000 ml  Output 320 ml  Net 680 ml   Filed Weights   09/19/23 1808  Weight: 88.9 kg    Examination:  General:  Pleasantly resting in bed, No acute distress.  Awake alert oriented x 3 Lungs:  Clear to auscultate bilaterally without rhonchi, wheeze, or rales. Heart:  Regular rate and rhythm.  Without murmurs, rubs, or gallops. Abdomen:  Soft, nontender, nondistended.  Without guarding or rebound. Extremities: Without cyanosis, clubbing, edema, or obvious deformity.  Data Reviewed: I have personally reviewed following labs and imaging studies  CBC: Recent Labs  Lab 09/19/23 1054 09/20/23 0530 09/21/23 0623  WBC 14.9* 9.9 7.6  NEUTROABS 12.0*  --   --   HGB 13.4 11.8* 11.5*  HCT 38.7* 35.7* 34.2*  MCV 96.8 102.3* 99.7  PLT 337 244 266   Basic Metabolic Panel: Recent Labs  Lab 09/19/23 1054 09/20/23 0530  NA 136 139  K 4.4 3.6  CL 101 107  CO2 21* 22  GLUCOSE 136* 106*  BUN 52* 50*  CREATININE 2.03* 1.43*  CALCIUM 10.6* 9.1   GFR: Estimated Creatinine Clearance: 46.2 mL/min (A) (by C-G formula based on SCr of 1.43 mg/dL (H)). Liver Function Tests: Recent Labs  Lab 09/19/23 1054  AST 74*  ALT 35  ALKPHOS 45  BILITOT 1.5*  PROT 8.1  ALBUMIN 4.4    Recent Results (from the past 240 hours)  Urine Culture (for pregnant, neutropenic or urologic patients or patients with an indwelling urinary catheter)     Status: Abnormal (Preliminary result)   Collection Time: 09/19/23  5:10 PM   Specimen: Urine, Clean Catch  Result Value Ref Range Status   Specimen Description   Final    URINE, CLEAN CATCH Performed at [facility], [address], [place], [place] [address]    Special Requests   Final    NONE Performed at [facility], [address], [place], [place] [address]    Culture (A)  Final    >=100,000 COLONIES/mL GRAM POSITIVE  COCCI IDENTIFICATION AND SUSCEPTIBILITIES TO FOLLOW CULTURE REINCUBATED FOR BETTER GROWTH Performed at [facility], [address] [place], [place] [address]    Report Status PENDING  Incomplete  Culture, blood (Routine X 2) w Reflex to ID Panel     Status: None (Preliminary result)   Collection Time: 09/19/23  6:31 PM   Specimen: BLOOD  Result Value Ref Range Status   Specimen Description   Final    BLOOD LEFT ANTECUBITAL Performed at [facility], [address], [place], [place] [address]    Special Requests   Final    BOTTLES DRAWN AEROBIC AND ANAEROBIC Blood Culture adequate volume Performed at [facility], [address] [place], [place] [address]    Culture   Final    NO GROWTH < 12 HOURS Performed at [facility], [address], [place], [place] [address]    Report Status PENDING  Incomplete     Radiology Studies: CT ABDOMEN PELVIS WO CONTRAST  Result Date: 09/19/2023 CLINICAL DATA:  Left lower quadrant pain EXAM: CT ABDOMEN AND PELVIS WITHOUT CONTRAST TECHNIQUE: Multidetector CT imaging of the abdomen and pelvis was performed following the standard protocol without IV contrast. RADIATION DOSE REDUCTION: This exam was performed according to the departmental dose-optimization program which includes automated exposure control, adjustment of the mA and/or kV according to patient size and/or use of iterative reconstruction technique. COMPARISON:  CT 01/10/2023 and older. FINDINGS: Lower chest: Coronary calcifications are seen. Small hiatal hernia. Linear opacity seen along bases likely scar or atelectasis. No pleural effusion. Calcified nodule in the right lower lobe consistent with old granulomatous disease. Additional punctate nodules which are noncalcified in the middle lobe on series 56, image 3. These are unchanged going back to a chest CT scan of 03/03/2020 demonstrating long-term stability. Hepatobiliary: Fatty liver  infiltration. No space-occupying liver lesion on this noncontrast examination. Gallbladder is non dilated. Pancreas: Mild global atrophy of the pancreas.  No obvious mass. Spleen: Normal in size without focal abnormality. Adrenals/Urinary Tract: Adrenal glands are preserved. No renal collecting system dilatation or ureteral stones. Bilateral nonobstructing intrarenal stones identified measuring up to 3 mm left lower pole and some punctate areas on the right. There is also a cyst along the upper pole left kidney extending medial with diameter of 4.1 cm and Hounsfield of 20. Not significant changed from previous. There is small right-sided renal cyst as well in the midportion with Hounsfield unit of 22 and diameter of 19 mm. Also unchanged. Distended urinary bladder. Stomach/Bowel: On this non oral contrast exam large bowel is normal course and caliber with scattered colonic stool. Few scattered colonic diverticula. Normal appendix. There is slight wall thickening along the rectum with adjacent stranding. Please correlate for any symptoms such as infectious or inflammatory process. There is also some thickening in the area of the anal canal. Please correlate clinical findings. Stomach is decompressed. The small bowel is nondilated. Vascular/Lymphatic: Aortic atherosclerosis. No enlarged abdominal or pelvic lymph nodes. Reproductive: Prostate is unremarkable. Other: Small fat containing left inguinal hernia. No free air or free fluid. Small umbilical hernia. Musculoskeletal: Curvature and degenerative changes along the spine and pelvis. Trace anterolisthesis of L4 on L5 and L5-S1 with endplate osteophytes. IMPRESSION: Nonobstructing lower pole bilateral renal stones. No ureteral stones or collecting system dilatation. There is some wall thickening along the rectum and anal region with some stranding. Please correlate for clinical findings. This would have a broad differential. Please correlate for any infectious or  inflammatory process or underlying lesion. Small hiatal hernia. Few colonic diverticula.  Normal appendix Electronically Signed   By: Karen Kays M.D.   On: 09/19/2023 16:08   CT Head Wo Contrast Result Date: 09/19/2023 CLINICAL DATA:  Neck trauma.  Multiple falls yesterday. EXAM: CT HEAD WITHOUT CONTRAST CT CERVICAL SPINE WITHOUT CONTRAST TECHNIQUE: Multidetector CT imaging of the head and cervical spine was performed following the standard protocol without intravenous contrast. Multiplanar CT image reconstructions of the cervical spine were also generated. RADIATION DOSE REDUCTION: This exam was performed according to the departmental dose-optimization program which includes automated exposure control, adjustment of the mA and/or kV according to patient size and/or use of iterative reconstruction technique. COMPARISON:  None Available. FINDINGS: CT HEAD FINDINGS Brain: No evidence of acute infarction, hemorrhage, hydrocephalus, extra-axial collection or mass lesion/mass effect. Generalized brain atrophy. Vascular: No hyperdense vessel or unexpected calcification. Skull: Normal. Negative for fracture or focal lesion. Sinuses/Orbits: No acute finding. CT CERVICAL SPINE FINDINGS Alignment: Normal. Skull base  and vertebrae: No acute fracture. No primary bone lesion or focal pathologic process. Chronic T1 spinous process fracture. Soft tissues and spinal canal: No prevertebral fluid or swelling. No visible canal hematoma. Disc levels:  Degenerative facet spurring asymmetric to the right Upper chest: Clear apical lungs. IMPRESSION: No evidence of acute intracranial or cervical spine injury. Electronically Signed   By: Tiburcio Pea M.D.   On: 09/19/2023 12:17   CT Cervical Spine Wo Contrast Result Date: 09/19/2023 CLINICAL DATA:  Neck trauma.  Multiple falls yesterday. EXAM: CT HEAD WITHOUT CONTRAST CT CERVICAL SPINE WITHOUT CONTRAST TECHNIQUE: Multidetector CT imaging of the head and cervical spine was  performed following the standard protocol without intravenous contrast. Multiplanar CT image reconstructions of the cervical spine were also generated. RADIATION DOSE REDUCTION: This exam was performed according to the departmental dose-optimization program which includes automated exposure control, adjustment of the mA and/or kV according to patient size and/or use of iterative reconstruction technique. COMPARISON:  None Available. FINDINGS: CT HEAD FINDINGS Brain: No evidence of acute infarction, hemorrhage, hydrocephalus, extra-axial collection or mass lesion/mass effect. Generalized brain atrophy. Vascular: No hyperdense vessel or unexpected calcification. Skull: Normal. Negative for fracture or focal lesion. Sinuses/Orbits: No acute finding. CT CERVICAL SPINE FINDINGS Alignment: Normal. Skull base and vertebrae: No acute fracture. No primary bone lesion or focal pathologic process. Chronic T1 spinous process fracture. Soft tissues and spinal canal: No prevertebral fluid or swelling. No visible canal hematoma. Disc levels:  Degenerative facet spurring asymmetric to the right Upper chest: Clear apical lungs. IMPRESSION: No evidence of acute intracranial or cervical spine injury. Electronically Signed   By: Tiburcio Pea M.D.   On: 09/19/2023 12:17   DG Pelvis 1-2 Views Result Date: 09/19/2023 CLINICAL DATA:  Multiple falls.  Constipation. EXAM: PELVIS - 1-2 VIEW COMPARISON:  None Available. FINDINGS: No acute fracture or dislocation. Mild osteopenia. The soft tissues are unremarkable. IMPRESSION: 1. No acute fracture or dislocation. 2. Mild osteopenia. Electronically Signed   By: Elgie Collard M.D.   On: 09/19/2023 12:11   Scheduled Meds:  Chlorhexidine Gluconate Cloth  6 each Topical Daily   escitalopram  10 mg Oral Daily   famotidine  40 mg Oral Daily   fenofibrate  160 mg Oral QHS   gabapentin  100 mg Oral QHS   lip balm   Topical Once   metoprolol succinate  25 mg Oral Daily   pantoprazole  (PROTONIX) IV  40 mg Intravenous Q12H   polyethylene glycol  17 g Oral Daily   potassium citrate  20 mEq Oral BID   pravastatin  40 mg Oral q1800   Continuous Infusions:  cefTRIAXone (ROCEPHIN)  IV 2 g (09/20/23 1720)     LOS: 1 day   Time spent:  Azucena Fallen, DO [facility]  If 7PM-7AM, please contact night-coverage [email or web address]  09/21/2023, 7:29 AM

## 2023-09-21 NOTE — Anesthesia Postprocedure Evaluation (Signed)
Anesthesia Post Note  Patient: Jon Bush  Procedure(s) Performed: ESOPHAGOGASTRODUODENOSCOPY (EGD) BIOPSY GIVENS CAPSULE STUDY     Patient location during evaluation: Endoscopy Anesthesia Type: MAC Level of consciousness: awake Pain management: pain level controlled Vital Signs Assessment: post-procedure vital signs reviewed and stable Respiratory status: spontaneous breathing, nonlabored ventilation and respiratory function stable Cardiovascular status: blood pressure returned to baseline and stable Postop Assessment: no apparent nausea or vomiting Anesthetic complications: no   No notable events documented.  Last Vitals:  Vitals:   09/21/23 1510 09/21/23 1603  BP: (!) 173/81 (!) 159/70  Pulse: (!) 56 62  Resp: 15 20  Temp:  36.7 C  SpO2: 98% 98%    Last Pain:  Vitals:   09/21/23 1603  TempSrc: Oral  PainSc: 0-No pain                 Raymona Boss P Kimani Hovis

## 2023-09-21 NOTE — Progress Notes (Signed)
Givens video capsule deployed endoscopically by MD Lorenso Quarry.  Capsule deployed at 1440.  Per Given's capsule instructions, patient to remain NPO until 1640 at which time they may progress to clear liquid diet. At 1840 patient may have a small snack such as a half a sandwich or a bowl of soup. At 2240 patient may progress to previously ordered diet.  The capsule endoscopy study will conclude at 0240 at which time the recorder and leads or belt can be removed and placed in a patient belongings bag. Endoscopy staff will pick up the equipment in the AM.  Instructions provided to patient and inpatient RN. Patient and RN demonstrated understanding.  Eulas Post, RN 09/21/23 2:45 PM

## 2023-09-21 NOTE — Op Note (Signed)
[facility] Patient Name: Jon Bush Procedure Date: 09/21/2023 MRN: [id number] Attending MD: Liliane Shi DO, DO, [id number] Date of Birth: 06-23-1946 CSN: [id number] Age: 78 Admit Type: Outpatient Procedure:                Upper GI endoscopy Indications:              Acute post hemorrhagic anemia, Melena Providers:                Liliane Shi DO, DO, Fransisca Connors, Rhodia Albright, Technician Referring MD:              Medicines:                See the Anesthesia note for documentation of the                            administered medications Complications:            No immediate complications. Estimated Blood Loss:     Estimated blood loss was minimal. Procedure:                Pre-Anesthesia Assessment:                           - ASA Grade Assessment: III - A patient with severe                            systemic disease.                           - The risks and benefits of the procedure and the                            sedation options and risks were discussed with the                            patient. All questions were answered and informed                            consent was obtained.                           After obtaining informed consent, the endoscope was                            passed under direct vision. Throughout the                            procedure, the patient's blood pressure, pulse, and                            oxygen saturations were monitored continuously. The                            [id number]-[id number] ([id number]) Olympus endoscope was introduced  through the mouth, and advanced to the second part                            of duodenum. The upper GI endoscopy was                            accomplished without difficulty. The patient                            tolerated the procedure well. Scope In: Scope Out: Findings:      A 2 cm hiatal hernia was present.       Localized mild inflammation characterized by erythema was found in the       gastric antrum. Biopsies were taken with a cold forceps for Helicobacter       pylori testing.      Localized mild inflammation characterized by erythema was found in the       duodenal bulb.      Given largely unremarkable exam for melena, decision was made to deploy       the video capsule into the duodenum. The endoscope was removed from the       patient and the video capsule deployment device was advanced through the       working channel. The video capsule was attached to the video capsule       deployment device. The endoscope/video capsule was then advanced under       direct visualization through the oropharynx, into the esophagus and into       the stomach. The video capsule deployment device was then advanced into       the duodenal bulb and video capsule was deployed. The endoscope was       removed from the patient and video capsule deployment device removed       from the working channel. The endoscope was then advanced under direct       visualization through the oropharynx into the second portion of the       duodenum where video capsule was seen and photo documented. Impression:               - 2 cm hiatal hernia.                           - Gastritis. Biopsied.                           - Duodenitis. Moderate Sedation:      See the other procedure note for documentation of moderate sedation with       intraservice time. Recommendation:           - Return patient to hospital ward for ongoing care.                           - Diet per video capsule instructions in chart.                           - Continue present medications.                           - Await pathology  results.                           - Video capsule endoscopy report will be reviewed                            and documented in separate encounter. Procedure Code(s):        --- Professional ---                            [phone], Esophagogastroduodenoscopy, flexible,                            transoral; with biopsy, single or multiple Diagnosis Code(s):        --- Professional ---                           K44.9, Diaphragmatic hernia without obstruction or                            gangrene                           K29.70, Gastritis, unspecified, without bleeding                           K29.80, Duodenitis without bleeding                           K92.1, Melena (includes Hematochezia)                           D62, Acute posthemorrhagic anemia CPT copyright 2022 American [employer]. All rights reserved. The codes documented in this report are preliminary and upon coder review may  be revised to meet current compliance requirements. Dr Liliane Shi, DO Liliane Shi DO, DO 09/21/2023 3:06:01 PM Number of Addenda: 0

## 2023-09-21 NOTE — Progress Notes (Signed)
Mobility Specialist - Progress Note   09/21/23 1602  Mobility  Activity Ambulated with assistance in hallway  Level of Assistance Standby assist, set-up cues, supervision of patient - no hands on  Assistive Device Front wheel walker  Distance Ambulated (ft) 80 ft  Activity Response Tolerated well  Mobility Referral Yes  Mobility visit 1 Mobility  Mobility Specialist Start Time (ACUTE ONLY) 1540  Mobility Specialist Stop Time (ACUTE ONLY) 1601  Mobility Specialist Time Calculation (min) (ACUTE ONLY) 21 min   Pt received in bed and agreeable to mobility. Pt was minA from STS. No complaints during session. Pt to bed after session with all needs met. RN in room.     [facility]

## 2023-09-21 NOTE — Transfer of Care (Signed)
Immediate Anesthesia Transfer of Care Note  Patient: Jon Bush  Procedure(s) Performed: ESOPHAGOGASTRODUODENOSCOPY (EGD) BIOPSY GIVENS CAPSULE STUDY  Patient Location: PACU  Anesthesia Type:MAC  Level of Consciousness: awake  Airway & Oxygen Therapy: Patient Spontanous Breathing and Patient connected to face mask oxygen  Post-op Assessment: Report given to RN and Post -op Vital signs reviewed and stable  Post vital signs: Reviewed and stable  Last Vitals:  Vitals Value Taken Time  BP 143/74 09/21/23 1452  Temp    Pulse 71 09/21/23 1458  Resp 17 09/21/23 1458  SpO2 98 % 09/21/23 1458  Vitals shown include unfiled device data.  Last Pain:  Vitals:   09/21/23 1310  TempSrc: Temporal  PainSc: 0-No pain      Patients Stated Pain Goal: 0 (09/21/23 0348)  Complications: No notable events documented.

## 2023-09-21 NOTE — Progress Notes (Signed)
   09/21/23 2214  BiPAP/CPAP/SIPAP  BiPAP/CPAP/SIPAP Pt Type Adult  BiPAP/CPAP/SIPAP Resmed  Mask Type Full face mask  Mask Size Large  Respiratory Rate 20 breaths/min  FiO2 (%) 21 %  Patient Home Equipment No  Auto Titrate Yes (automode, min4cm, max20cmh2o)  CPAP/SIPAP surface wiped down Yes  BiPAP/CPAP /SiPAP Vitals  Pulse Rate 71  Resp 20  SpO2 97 %

## 2023-09-21 NOTE — Progress Notes (Signed)
Pt had a small loose stool that was a dark "cherry" color. No complaints of pain or other discomforts. Mick Sell RN

## 2023-09-21 NOTE — Progress Notes (Signed)
SATURATION QUALIFICATIONS: (This note is used to comply with regulatory documentation for home oxygen)  Patient Saturations on Room Air at Rest = 98%  Patient Saturations on Room Air while Ambulating = 98%

## 2023-09-21 NOTE — Interval H&P Note (Signed)
History and Physical Interval Note:  09/21/2023 1:58 PM  Jon Bush  has presented today for surgery, with the diagnosis of Melena, anemia.  The various methods of treatment have been discussed with the patient and family. After consideration of risks, benefits and other options for treatment, the patient has consented to  Procedure(s): ESOPHAGOGASTRODUODENOSCOPY (EGD) (N/A) as a surgical intervention with possible deployment of video capsule endoscope.  The patient's history has been reviewed, patient examined, no change in status, stable for surgery.  I have reviewed the patient's chart and labs.  Questions were answered to the patient's satisfaction.     Lynann Bologna

## 2023-09-21 NOTE — Progress Notes (Addendum)
* Day of Surgery * Subjective: First time meeting Jon Bush. He is pleasant and has no specific urological complaints. He is tolerating his foley well which is draining clear yellow urine.   Objective: Vital signs in last 24 hours: Temp:  [97.5 F (36.4 C)-98 F (36.7 C)] 97.7 F (36.5 C) (01/30 0524) Pulse Rate:  [56-82] 56 (01/30 0524) Resp:  [13-20] 17 (01/30 0524) BP: (134-153)/(62-78) 145/62 (01/30 0524) SpO2:  [96 %-100 %] 97 % (01/30 0524) FiO2 (%):  [21 %] 21 % (01/29 2236)  Assessment/Plan: #urinary retention #CKD #UTI Over wire foley placed with Dr. Berneice Heinrich on 09/19/23. Draining clear yellow urine. Trend labs. Scr continues to improve daily. Discharge with foley. Pt plans to follow up with his regular Urologist, Dr. Sabino Gasser,  to discuss TOV on an outpt basis.  UC Enterococcus (+). Species is not sensitive to ceftriaxone. Recommend transition to Fosfomycin, Vanc or Ampicillin Urology will sign off at this time. Please call with questions  Intake/Output from previous day: 01/29 0701 - 01/30 0700 In: 1000 [I.V.:1000] Out: 320 [Urine:320]  Intake/Output this shift: No intake/output data recorded.  Physical Exam:  General: Alert and oriented CV: No cyanosis Lungs: equal chest rise Abdomen: Soft, NTND, no rebound or guarding Gu: Foley draining clear yellow urine  Lab Results: Recent Labs    09/19/23 1054 09/20/23 0530 09/21/23 0623  HGB 13.4 11.8* 11.5*  HCT 38.7* 35.7* 34.2*   BMET Recent Labs    09/20/23 0530 09/21/23 0623  NA 139 136  K 3.6 3.8  CL 107 103  CO2 22 25  GLUCOSE 106* 114*  BUN 50* 35*  CREATININE 1.43* 1.33*  CALCIUM 9.1 9.4     Studies/Results: CT ABDOMEN PELVIS WO CONTRAST Result Date: 09/19/2023 CLINICAL DATA:  Left lower quadrant pain EXAM: CT ABDOMEN AND PELVIS WITHOUT CONTRAST TECHNIQUE: Multidetector CT imaging of the abdomen and pelvis was performed following the standard protocol without IV contrast. RADIATION  DOSE REDUCTION: This exam was performed according to the departmental dose-optimization program which includes automated exposure control, adjustment of the mA and/or kV according to patient size and/or use of iterative reconstruction technique. COMPARISON:  CT 01/10/2023 and older. FINDINGS: Lower chest: Coronary calcifications are seen. Small hiatal hernia. Linear opacity seen along bases likely scar or atelectasis. No pleural effusion. Calcified nodule in the right lower lobe consistent with old granulomatous disease. Additional punctate nodules which are noncalcified in the middle lobe on series 56, image 3. These are unchanged going back to a chest CT scan of 03/03/2020 demonstrating long-term stability. Hepatobiliary: Fatty liver infiltration. No space-occupying liver lesion on this noncontrast examination. Gallbladder is non dilated. Pancreas: Mild global atrophy of the pancreas.  No obvious mass. Spleen: Normal in size without focal abnormality. Adrenals/Urinary Tract: Adrenal glands are preserved. No renal collecting system dilatation or ureteral stones. Bilateral nonobstructing intrarenal stones identified measuring up to 3 mm left lower pole and some punctate areas on the right. There is also a cyst along the upper pole left kidney extending medial with diameter of 4.1 cm and Hounsfield of 20. Not significant changed from previous. There is small right-sided renal cyst as well in the midportion with Hounsfield unit of 22 and diameter of 19 mm. Also unchanged. Distended urinary bladder. Stomach/Bowel: On this non oral contrast exam large bowel is normal course and caliber with scattered colonic stool. Few scattered colonic diverticula. Normal appendix. There is slight wall thickening along the rectum with adjacent stranding. Please correlate for any symptoms  such as infectious or inflammatory process. There is also some thickening in the area of the anal canal. Please correlate clinical findings. Stomach  is decompressed. The small bowel is nondilated. Vascular/Lymphatic: Aortic atherosclerosis. No enlarged abdominal or pelvic lymph nodes. Reproductive: Prostate is unremarkable. Other: Small fat containing left inguinal hernia. No free air or free fluid. Small umbilical hernia. Musculoskeletal: Curvature and degenerative changes along the spine and pelvis. Trace anterolisthesis of L4 on L5 and L5-S1 with endplate osteophytes. IMPRESSION: Nonobstructing lower pole bilateral renal stones. No ureteral stones or collecting system dilatation. There is some wall thickening along the rectum and anal region with some stranding. Please correlate for clinical findings. This would have a broad differential. Please correlate for any infectious or inflammatory process or underlying lesion. Small hiatal hernia. Few colonic diverticula.  Normal appendix Electronically Signed   By: Karen Kays M.D.   On: 09/19/2023 16:08   CT Head Wo Contrast Result Date: 09/19/2023 CLINICAL DATA:  Neck trauma.  Multiple falls yesterday. EXAM: CT HEAD WITHOUT CONTRAST CT CERVICAL SPINE WITHOUT CONTRAST TECHNIQUE: Multidetector CT imaging of the head and cervical spine was performed following the standard protocol without intravenous contrast. Multiplanar CT image reconstructions of the cervical spine were also generated. RADIATION DOSE REDUCTION: This exam was performed according to the departmental dose-optimization program which includes automated exposure control, adjustment of the mA and/or kV according to patient size and/or use of iterative reconstruction technique. COMPARISON:  None Available. FINDINGS: CT HEAD FINDINGS Brain: No evidence of acute infarction, hemorrhage, hydrocephalus, extra-axial collection or mass lesion/mass effect. Generalized brain atrophy. Vascular: No hyperdense vessel or unexpected calcification. Skull: Normal. Negative for fracture or focal lesion. Sinuses/Orbits: No acute finding. CT CERVICAL SPINE FINDINGS  Alignment: Normal. Skull base and vertebrae: No acute fracture. No primary bone lesion or focal pathologic process. Chronic T1 spinous process fracture. Soft tissues and spinal canal: No prevertebral fluid or swelling. No visible canal hematoma. Disc levels:  Degenerative facet spurring asymmetric to the right Upper chest: Clear apical lungs. IMPRESSION: No evidence of acute intracranial or cervical spine injury. Electronically Signed   By: Tiburcio Pea M.D.   On: 09/19/2023 12:17   CT Cervical Spine Wo Contrast Result Date: 09/19/2023 CLINICAL DATA:  Neck trauma.  Multiple falls yesterday. EXAM: CT HEAD WITHOUT CONTRAST CT CERVICAL SPINE WITHOUT CONTRAST TECHNIQUE: Multidetector CT imaging of the head and cervical spine was performed following the standard protocol without intravenous contrast. Multiplanar CT image reconstructions of the cervical spine were also generated. RADIATION DOSE REDUCTION: This exam was performed according to the departmental dose-optimization program which includes automated exposure control, adjustment of the mA and/or kV according to patient size and/or use of iterative reconstruction technique. COMPARISON:  None Available. FINDINGS: CT HEAD FINDINGS Brain: No evidence of acute infarction, hemorrhage, hydrocephalus, extra-axial collection or mass lesion/mass effect. Generalized brain atrophy. Vascular: No hyperdense vessel or unexpected calcification. Skull: Normal. Negative for fracture or focal lesion. Sinuses/Orbits: No acute finding. CT CERVICAL SPINE FINDINGS Alignment: Normal. Skull base and vertebrae: No acute fracture. No primary bone lesion or focal pathologic process. Chronic T1 spinous process fracture. Soft tissues and spinal canal: No prevertebral fluid or swelling. No visible canal hematoma. Disc levels:  Degenerative facet spurring asymmetric to the right Upper chest: Clear apical lungs. IMPRESSION: No evidence of acute intracranial or cervical spine injury.  Electronically Signed   By: Tiburcio Pea M.D.   On: 09/19/2023 12:17   DG Pelvis 1-2 Views Result Date: 09/19/2023 CLINICAL DATA:  Multiple falls.  Constipation. EXAM: PELVIS - 1-2 VIEW COMPARISON:  None Available. FINDINGS: No acute fracture or dislocation. Mild osteopenia. The soft tissues are unremarkable. IMPRESSION: 1. No acute fracture or dislocation. 2. Mild osteopenia. Electronically Signed   By: Elgie Collard M.D.   On: 09/19/2023 12:11      LOS: 1 day   Elmon Kirschner, NP [facility] Pager: [phone]  09/21/2023, 9:29 AM

## 2023-09-22 ENCOUNTER — Encounter (HOSPITAL_COMMUNITY): Payer: Self-pay | Admitting: Internal Medicine

## 2023-09-22 DIAGNOSIS — N1832 Chronic kidney disease, stage 3b: Secondary | ICD-10-CM | POA: Diagnosis not present

## 2023-09-22 DIAGNOSIS — N3000 Acute cystitis without hematuria: Secondary | ICD-10-CM | POA: Diagnosis not present

## 2023-09-22 DIAGNOSIS — R338 Other retention of urine: Secondary | ICD-10-CM | POA: Diagnosis not present

## 2023-09-22 DIAGNOSIS — I10 Essential (primary) hypertension: Secondary | ICD-10-CM | POA: Diagnosis not present

## 2023-09-22 LAB — URINE CULTURE: Culture: >=100000 — AB

## 2023-09-22 LAB — HEMOGLOBIN AND HEMATOCRIT, BLOOD
HCT: 39.3 % (ref 39.0–52.0)
Hemoglobin: 13.4 g/dL (ref 13.0–17.0)

## 2023-09-22 MED ORDER — BISACODYL 5 MG PO TBEC
5.0000 mg | DELAYED_RELEASE_TABLET | Freq: Every day | ORAL | Status: DC
  Administered 2023-09-22 – 2023-09-24 (×3): 5 mg via ORAL
  Filled 2023-09-22 (×3): qty 1

## 2023-09-22 MED ORDER — NITROFURANTOIN MONOHYD MACRO 100 MG PO CAPS
100.0000 mg | ORAL_CAPSULE | Freq: Two times a day (BID) | ORAL | Status: DC
  Administered 2023-09-22 – 2023-09-24 (×6): 100 mg via ORAL
  Filled 2023-09-22 (×6): qty 1

## 2023-09-22 MED ORDER — LACTULOSE 10 GM/15ML PO SOLN
30.0000 g | Freq: Two times a day (BID) | ORAL | Status: DC | PRN
  Administered 2023-09-22: 30 g via ORAL
  Filled 2023-09-22: qty 45
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
## 2023-09-22 NOTE — Plan of Care (Signed)
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
## 2023-09-22 NOTE — Progress Notes (Signed)
Physical Therapy Treatment Patient Details Name: Jon Bush MRN: [id number] DOB: Jan 27, 1946 Today's Date: 09/22/2023   History of Present Illness Patient is a 78 year old male who presented after falls, confusion, and decreased ability to urinate. Patient was admitted with UTI, metabolic encephalopathy, constipation, and positive fecal occult blood test. PMH: OSA, HTN, hyperlipidemia, CKD III, BPH, h/o urethral stricture.    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  Currently feeling "poorly" and "constipated" Assisted OOB to amb to bathroom then in hallway was difficult.  General transfer comment: required increased effort and time to rise.  Slow moving.  Also asissted with a toilet transfer.  Extended time on toilet due to constipation, feelingh uncomfortable and "backed up".General Gait Details: amb without walker this session present with increased gait instability, wider BOS and decreased shuffled steps.  Distance limited by fatigue.  Several standing rest breaks. Pt lives home alone. Very deconditioned from prior level. Pt will need ST Rehab at SNF to address mobility and functional decline prior to safely returning home.    If plan is discharge home, recommend the following: A little help with walking and/or transfers;Assistance with cooking/housework;Assist for transportation   Can travel by private vehicle     Yes  Equipment Recommendations  None recommended by PT    Recommendations for Other Services       Precautions / Restrictions Precautions Precautions: Fall Restrictions Weight Bearing Restrictions Per Provider Order: No     Mobility  Bed Mobility Overal bed mobility: Needs Assistance Bed Mobility: Supine to Sit     Supine to sit: Contact guard, Min assist     General bed mobility comments: increased time and effort    Transfers Overall transfer level: Needs assistance Equipment  used: None Transfers: Sit to/from Stand Sit to Stand: Contact guard assist, Min assist           General transfer comment: required increased effort and time to rise.  Slow moving.  Also asissted with a toilet transfer.  Extended time on toilet due to constipation, feelingh uncomfortable and "backed up".    Ambulation/Gait Ambulation/Gait assistance: Contact guard assist, Min assist Gait Distance (Feet): 120 Feet Assistive device: None Gait Pattern/deviations: Step-through pattern Gait velocity: decr     General Gait Details: amb without walker this session present with increased gait instability, wider BOS and decreased shuffled steps.  Distance limited by fatigue.  Several standing rest breaks.   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  Currently feeling "poorly" and "constipated"        Exercises      General Comments        Pertinent Vitals/Pain Pain Assessment Pain Assessment:  No/denies pain    Home Living                          Prior Function            PT Goals (current goals can now be found in the care plan section) Progress towards PT goals: Progressing toward goals    Frequency    Min 1X/week      PT Plan      Co-evaluation              AM-PAC PT "6 Clicks" Mobility   Outcome Measure  Help needed turning from your back to your side while in a flat bed without using bedrails?: A Little Help needed moving from lying on your back to sitting on the side of a flat bed without using bedrails?: A Little Help needed moving to and from a bed to a chair  (including a wheelchair)?: A Little Help needed standing up from a chair using your arms (e.g., wheelchair or bedside chair)?: A Little Help needed to walk in hospital room?: A Lot Help needed climbing 3-5 steps with a railing? : A Lot 6 Click Score: 16    End of Session Equipment Utilized During Treatment: Gait belt Activity Tolerance: Patient tolerated treatment well Patient left: in bed;with call bell/phone within reach Nurse Communication: Mobility status PT Visit Diagnosis: Unsteadiness on feet (R26.81);Muscle weakness (generalized) (M62.81);Difficulty in walking, not elsewhere classified (R26.2);Other symptoms and signs involving the nervous system (R29.898)     Time: 1914-7829 PT Time Calculation (min) (ACUTE ONLY): 28 min  Charges:    $Gait Training: 8-22 mins $Therapeutic Activity: 8-22 mins PT General Charges $$ ACUTE PT VISIT: 1 Visit                     Felecia Shelling  PTA Acute  Rehabilitation Services Office M-F          [phone]
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
## 2023-09-22 NOTE — TOC Progression Note (Addendum)
Transition of Care [facility]) - Progression Note    Patient Details  Name: Jon Bush MRN: [id number] Date of Birth: 1946-06-29  Transition of [facility]) CM/SW Contact  Beckie Busing, RN Phone [phone]  09/22/2023, 3:45 PM  Clinical Narrative:    CM received message from MD stating that patient is medically stable for discharge. CM at bedside to present daughter and granddaughter bed offers. Granddaughter has a few facilities that she would like to tour in the morning. Family requesting until tomorrow at noon to make choice. List has been provided and handoff updated for TOC to follow up in the am.   [facility] [facility] has made bed offer and family would like to accept. [facility] can not accept patient until Sunday due to 4 admits and nursing shortage. Message has been sent to MD to make aware.    Expected Discharge Plan: Skilled Nursing Facility Barriers to Discharge: No Barriers Identified  Expected Discharge Plan and Services In-house Referral: NA Discharge Planning Services: CM Consult Post Acute Care Choice: Skilled Nursing Facility Living arrangements for the past 2 months: Single Family Home                 DME Arranged: N/A DME Agency: NA       HH Arranged: NA HH Agency: NA         Social Determinants of Health (SDOH) Interventions SDOH Screenings   Food Insecurity: No Food Insecurity (09/20/2023)  Housing: Low Risk  (09/21/2023)  Transportation Needs: No Transportation Needs (09/20/2023)  Utilities: Not At Risk (09/20/2023)  Social Connections: Patient Declined (09/20/2023)  Tobacco Use: Low Risk  (09/21/2023)    Readmission Risk Interventions     No data to display

## 2023-09-22 NOTE — Progress Notes (Signed)
Mobility Specialist - Progress Note   09/22/23 1409  Mobility  Activity Ambulated with assistance in hallway  Level of Assistance Standby assist, set-up cues, supervision of patient - no hands on  Assistive Device None  Distance Ambulated (ft) 500 ft  Activity Response Tolerated well  Mobility Referral Yes  Mobility visit 1 Mobility  Mobility Specialist Start Time (ACUTE ONLY) 1349  Mobility Specialist Stop Time (ACUTE ONLY) 1409  Mobility Specialist Time Calculation (min) (ACUTE ONLY) 20 min   Pt received in bed and agreeable to mobility. No complaints during session. Pt to bathroom after session with all needs met. Instructed pt to pull call bell when finished.    [facility]

## 2023-09-22 NOTE — Progress Notes (Signed)
PROGRESS NOTE    Jon Bush  ZOX:[id number] DOB: 06/02/1946 DOA: 09/19/2023 PCP: Madaline Guthrie, MD   Brief Narrative:  78 y.o. M with HTN, HLD, CKD IIIb baseline 2.1-2.2, OSA on CPAP and urinary retention who presented from home after multiple falls.  Patient found to have urinary retention, difficulty placing Foley, urology consulted for Foley placement with plan for outpatient follow-up.  Notable black stool noted at intake concerning for GI bleed with FOBT positive labs.  At this time patient is tentatively planned for upper endoscopy to rule out source for melena, lower endoscopy last September notable for polyps and hemorrhoids as well as diverticulosis.  Pending results of upper endoscopy patient may require pill endoscopy versus outpatient follow-up.  In the interim patient urine culture notably positive for Enterococcus faecalis, transition from ceftriaxone to amoxicillin for 3-day course.  Disposition pending findings as above.  1/31: Medically stable.  EGD with gastritis and duodenitis-biopsies were taken.  Capsule endoscopy negative.  GI cleared for discharge.  PT is recommending SNF-TOC started the workup.  Assessment & Plan:   Principal Problem:   UTI (urinary tract infection) Active Problems:   Acute urinary retention   Hyperlipidemia   Essential hypertension   Hypercalcemia   CKD (chronic kidney disease), stage IIIb (HCC)   OSA (obstructive sleep apnea)   Positive fecal occult blood test   Acute on chronic normocytic anemia  Rule out GI bleed given melena Chronic anemia of chronic disease noted -GI consulted given hemoglobin drop with ongoing black stool -Upper endoscopy with concern of gastritis and duodenitis-biopsies were taken.  Patient also had capsule endoscopy which was negative for any acute concern -Recent colonoscopy 9/24 notable for polyps hemorrhoids and diverticulosis, deferred to GI for possible need for repeat given findings -Hold  anticoagulation, continue PPI   UTI, POA secondary to Enterococcus faecalis Patient does not meet sepsis criteria, DC ceftriaxone,  Urine cultures with methicillin-resistant Enterococcus and now started growing MRSA -Antibiotics switched with Macrobid  Acute urinary retention History urethral stricture s/p DVIU (for urethral stricture?) BPH s/p PVP  Urology were consulted and placed a foley.  Outpatient follow-up with Dr. Kyla Balzarine [facility] for voiding trial after discharge Continue Foley at discharge   Acute metabolic encephalopathy, resolving Mild cognitive impairment -Likely secondary to UTI, now at baseline -Baseline cognitive impairment per neurologist at [facility] -recommend outpatient follow-up   OSA (obstructive sleep apnea) -CPAP at night   CKD (chronic kidney disease), stage IIIb (HCC) Volume overload -Baseline 2.1 -Without AKI -Lasix 40IV x1 given BLE edema - likely secondary to volume overload/IVF at intake   Hypercalcemia -Resolved   Essential hypertension -BP normal -Continue metoprolol   Hyperlipidemia -Continue pravastatin, fibrate  DVT prophylaxis: Place and maintain sequential compression device Start: 09/20/23 1629 Code Status:   Code Status: Full Code Family Communication: None present  Status is: Inpatient Dispo: The patient is from: Home              Anticipated d/c is to: To be determined              Anticipated d/c date is: 24 to 48 hours              Patient currently not medically stable for discharge  Consultants:  GI  Procedures:  Upper endoscopy, capsule endoscopy  Antimicrobials:  Macrobid  Subjective: Patient was sitting comfortably in chair when seen today.  Still feeling very weak and agreed to go to a rehab as recommended by PT.  Objective: Vitals:   09/21/23 1510 09/21/23 1603 09/21/23 2007 09/21/23 2214  BP: (!) 173/81 (!) 159/70 (!) 140/89   Pulse: (!) 56 62 78 71  Resp: 15 20 16 20   Temp:  98 F (36.7 C) 98.2  F (36.8 C)   TempSrc:  Oral Oral   SpO2: 98% 98% 97% 97%  Weight:      Height:        Intake/Output Summary (Last 24 hours) at 09/22/2023 1441 Last data filed at 09/22/2023 1300 Gross per 24 hour  Intake 800 ml  Output 2550 ml  Net -1750 ml   Filed Weights   09/19/23 1808  Weight: 88.9 kg    Examination:  General.  Well-developed elderly man, in no acute distress. Pulmonary.  Lungs clear bilaterally, normal respiratory effort. CV.  Regular rate and rhythm, no JVD, rub or murmur. Abdomen.  Soft, nontender, nondistended, BS positive. CNS.  Alert and oriented .  No focal neurologic deficit. Extremities.  No edema, no cyanosis, pulses intact and symmetrical.  Data Reviewed: I have personally reviewed following labs and imaging studies  CBC: Recent Labs  Lab 09/19/23 1054 09/20/23 0530 09/21/23 0623 09/22/23 1101  WBC 14.9* 9.9 7.6  --   NEUTROABS 12.0*  --   --   --   HGB 13.4 11.8* 11.5* 13.4  HCT 38.7* 35.7* 34.2* 39.3  MCV 96.8 102.3* 99.7  --   PLT 337 244 266  --    Basic Metabolic Panel: Recent Labs  Lab 09/19/23 1054 09/20/23 0530 09/21/23 0623  NA 136 139 136  K 4.4 3.6 3.8  CL 101 107 103  CO2 21* 22 25  GLUCOSE 136* 106* 114*  BUN 52* 50* 35*  CREATININE 2.03* 1.43* 1.33*  CALCIUM 10.6* 9.1 9.4   GFR: Estimated Creatinine Clearance: 49.7 mL/min (A) (by C-G formula based on SCr of 1.33 mg/dL (H)). Liver Function Tests: Recent Labs  Lab 09/19/23 1054  AST 74*  ALT 35  ALKPHOS 45  BILITOT 1.5*  PROT 8.1  ALBUMIN 4.4    Recent Results (from the past 240 hours)  Urine Culture (for pregnant, neutropenic or urologic patients or patients with an indwelling urinary catheter)     Status: Abnormal   Collection Time: 09/19/23  5:10 PM   Specimen: Urine, Clean Catch  Result Value Ref Range Status   Specimen Description   Final    URINE, CLEAN CATCH Performed at [facility], [address] [place], [place] [address]     Special Requests   Final    NONE Performed at [facility], [address] [place], [place] [address]    Culture (A)  Final    >=100,000 COLONIES/mL ENTEROCOCCUS FAECALIS 40,000 COLONIES/mL METHICILLIN RESISTANT STAPHYLOCOCCUS AUREUS    Report Status 09/22/2023 FINAL  Final   Organism ID, Bacteria ENTEROCOCCUS FAECALIS (A)  Final   Organism ID, Bacteria METHICILLIN RESISTANT STAPHYLOCOCCUS AUREUS (A)  Final      Susceptibility   Enterococcus faecalis - MIC*    AMPICILLIN <=2 SENSITIVE Sensitive     NITROFURANTOIN <=16 SENSITIVE Sensitive     VANCOMYCIN 1 SENSITIVE Sensitive     * >=100,000 COLONIES/mL ENTEROCOCCUS FAECALIS   Methicillin resistant staphylococcus aureus - MIC*    CIPROFLOXACIN >=8 RESISTANT Resistant     GENTAMICIN <=0.5 SENSITIVE Sensitive     NITROFURANTOIN <=16 SENSITIVE Sensitive     OXACILLIN >=4 RESISTANT Resistant     TETRACYCLINE <=1 SENSITIVE Sensitive  VANCOMYCIN 1 SENSITIVE Sensitive     TRIMETH/SULFA <=10 SENSITIVE Sensitive     RIFAMPIN <=0.5 SENSITIVE Sensitive     Inducible Clindamycin NEGATIVE Sensitive     LINEZOLID 2 SENSITIVE Sensitive     * 40,000 COLONIES/mL METHICILLIN RESISTANT STAPHYLOCOCCUS AUREUS  Culture, blood (Routine X 2) w Reflex to ID Panel     Status: None (Preliminary result)   Collection Time: 09/19/23  6:31 PM   Specimen: BLOOD  Result Value Ref Range Status   Specimen Description   Final    BLOOD LEFT ANTECUBITAL Performed at [facility], [address], [place], [place] [address]    Special Requests   Final    BOTTLES DRAWN AEROBIC AND ANAEROBIC Blood Culture adequate volume Performed at [facility], [address], [place], [place] [address]    Culture   Final    NO GROWTH 3 DAYS Performed at [facility], [address] [place], [place] [address]    Report Status PENDING  Incomplete     Radiology Studies: No results found.  Scheduled Meds:   Chlorhexidine Gluconate Cloth  6 each Topical Daily   escitalopram  10 mg Oral Daily   famotidine  40 mg Oral Daily   fenofibrate  160 mg Oral QHS   gabapentin  100 mg Oral QHS   metoprolol succinate  25 mg Oral Daily   nitrofurantoin (macrocrystal-monohydrate)  100 mg Oral Q12H   pantoprazole (PROTONIX) IV  40 mg Intravenous Q12H   polyethylene glycol  17 g Oral BID   potassium citrate  20 mEq Oral BID   pravastatin  40 mg Oral q1800   senna-docusate  1 tablet Oral BID   Continuous Infusions:     LOS: 2 days   Time spent: 45 min  Arnetha Courser, MD [facility]  If 7PM-7AM, please contact night-coverage [email or web address]  09/22/2023, 2:41 PM

## 2023-09-23 DIAGNOSIS — N3000 Acute cystitis without hematuria: Secondary | ICD-10-CM | POA: Diagnosis not present

## 2023-09-23 LAB — BASIC METABOLIC PANEL
Anion gap: 9 (ref 5–15)
BUN: 28 mg/dL — ABNORMAL HIGH (ref 8–23)
CO2: 24 mmol/L (ref 22–32)
Calcium: 9.6 mg/dL (ref 8.9–10.3)
Chloride: 103 mmol/L (ref 98–111)
Creatinine, Ser: 1.31 mg/dL — ABNORMAL HIGH (ref 0.61–1.24)
GFR, Estimated: 55 mL/min — ABNORMAL LOW (ref >60)
Glucose, Bld: 121 mg/dL — ABNORMAL HIGH (ref 70–99)
Potassium: 3.9 mmol/L (ref 3.5–5.1)
Sodium: 136 mmol/L (ref 135–145)

## 2023-09-23 LAB — CBC
HCT: 36.3 % — ABNORMAL LOW (ref 39.0–52.0)
Hemoglobin: 12.3 g/dL — ABNORMAL LOW (ref 13.0–17.0)
MCH: 33.7 pg (ref 26.0–34.0)
MCHC: 33.9 g/dL (ref 30.0–36.0)
MCV: 99.5 fL (ref 80.0–100.0)
Platelets: 315 10*3/uL (ref 150–400)
RBC: 3.65 MIL/uL — ABNORMAL LOW (ref 4.22–5.81)
RDW: 12.2 % (ref 11.5–15.5)
WBC: 7.6 10*3/uL (ref 4.0–10.5)
nRBC: 0 % (ref 0.0–0.2)

## 2023-09-23 MED ORDER — PANTOPRAZOLE SODIUM 40 MG PO TBEC
40.0000 mg | DELAYED_RELEASE_TABLET | Freq: Once | ORAL | Status: AC
  Administered 2023-09-23: 40 mg via ORAL
  Filled 2023-09-23: qty 1

## 2023-09-23 MED ORDER — SIMETHICONE 80 MG PO CHEW
80.0000 mg | CHEWABLE_TABLET | Freq: Four times a day (QID) | ORAL | Status: DC | PRN
  Administered 2023-09-23 (×2): 80 mg via ORAL
  Filled 2023-09-23 (×3): qty 1

## 2023-09-23 MED ORDER — PANTOPRAZOLE SODIUM 40 MG PO TBEC
40.0000 mg | DELAYED_RELEASE_TABLET | Freq: Once | ORAL | Status: AC
Start: 2023-09-23 — End: 2023-09-23
  Administered 2023-09-23: 40 mg via ORAL
  Filled 2023-09-23: qty 1

## 2023-09-23 NOTE — Plan of Care (Signed)

## 2023-09-23 NOTE — Progress Notes (Signed)
No acute changes this shift, patient took meds as ordered, participated in walking in hallway and appears to be more alert and less forgetful this shift, in bed resting, call light in reach

## 2023-09-23 NOTE — Progress Notes (Signed)
PROGRESS NOTE    Jon Bush  ZOX:[id number] DOB: 01/26/1946 DOA: 09/19/2023 PCP: Madaline Guthrie, MD   Brief Narrative:  78 y.o. M with HTN, HLD, CKD IIIb baseline 2.1-2.2, OSA on CPAP and urinary retention who presented from home after multiple falls.  Patient found to have urinary retention, difficulty placing Foley, urology consulted for Foley placement with plan for outpatient follow-up.  Notable black stool noted at intake concerning for GI bleed with FOBT positive labs.   At this time patient is tentatively planned for upper endoscopy to rule out source for melena, lower endoscopy last September notable for polyps and hemorrhoids as well as diverticulosis.  Pending results of upper endoscopy patient may require pill endoscopy versus outpatient follow-up.  In the interim patient urine culture notably positive for Enterococcus faecalis, transition from ceftriaxone to amoxicillin for 3-day course.  Disposition pending findings as above.   1/31: Medically stable.  EGD with gastritis and duodenitis-biopsies were taken.  Capsule endoscopy negative.  GI cleared for discharge.  PT is recommending SNF-TOC started the workup.   Assessment & Plan:   Acute on chronic normocytic anemia  -GI consulted given hemoglobin drop with ongoing black stool -Upper endoscopy on 1/30 with concern of gastritis and duodenitis-biopsies were taken.  Patient also had capsule endoscopy 1/31 which was negative for any acute concern -Colonoscopy done on 05/18/2023 that showed colon polyps, internal hemorrhoids and sigmoid diverticulosis. -Hold anticoagulation, continue PPI -Per GI: No signs of active GI blood loss.  If further anemia persist consider follow-up with his primary GI outpatient.  Continue bowel regimen with MiraLAX,  lactulose and Dulcolax.  GI signed off.   UTI, POA secondary to Enterococcus faecalis -Patient does not meet sepsis criteria, DC ceftriaxone,  Urine cultures with  methicillin-resistant Enterococcus and now started growing MRSA -Antibiotics switched with Macrobid   Acute urinary retention History urethral stricture s/p DVIU (for urethral stricture?) BPH s/p PVP  -Urology were consulted and placed a foley.  -Outpatient follow-up with Dr. Kyla Balzarine [facility] for voiding trial after discharge Continue Foley at discharge   Acute metabolic encephalopathy, resolving Mild cognitive impairment -Likely secondary to UTI, now at baseline -Baseline cognitive impairment per neurologist at [facility] -recommend outpatient follow-up   OSA (obstructive sleep apnea) -CPAP at night   CKD (chronic kidney disease), stage IIIb (HCC) Volume overload -Creatinine at baseline.   Hypercalcemia -Resolved   Essential hypertension -BP normal -Continue metoprolol   Hyperlipidemia -Continue pravastatin, fibrate  DVT prophylaxis: SCD Code Status: Full code Family Communication:  None present at bedside.  Plan of care discussed with patient in length and he verbalized understanding and agreed with it. Disposition Plan: SNF likely tomorrow  Consultants:  GI  Procedures:  EGD Capsule endoscopy  Antimicrobials:  Rocephin Macrobid  Status is: Inpatient      Subjective: Patient seen and examined.  Sitting comfortably on the recliner.  Denies any complaints.  Remained afebrile.  Denies melena.  No acute events overnight.  Objective: Vitals:   09/21/23 2007 09/21/23 2214 09/22/23 1947 09/23/23 0631  BP: (!) 140/89  (!) 152/77 (!) 165/80  Pulse: 78 71 73 (!) 55  Resp: 16 20 16    Temp: 98.2 F (36.8 C)  97.9 F (36.6 C) (!) 97.5 F (36.4 C)  TempSrc: Oral  Oral Oral  SpO2: 97% 97% 99% 98%  Weight:      Height:        Intake/Output Summary (Last 24 hours) at 09/23/2023 1403 Last data filed at 09/23/2023  6440 Gross per 24 hour  Intake 120 ml  Output 1650 ml  Net -1530 ml   Filed Weights   09/19/23 1808  Weight: 88.9 kg     Examination:  General exam: Appears calm and comfortable, on room air, communicating well, sitting comfortably on recliner. Respiratory system: Clear to auscultation. Respiratory effort normal. Cardiovascular system: S1 & S2 heard, RRR. No JVD, murmurs, rubs, gallops or clicks. No pedal edema. Gastrointestinal system: Abdomen is nondistended, soft and nontender. No organomegaly or masses felt. Normal bowel sounds heard. Has Foley catheter Central nervous system: Alert and oriented. No focal neurological deficits. Extremities: Symmetric 5 x 5 power. Skin: No rashes, lesions or ulcers Psychiatry: Judgement and insight appear normal. Mood & affect appropriate.    Data Reviewed: I have personally reviewed following labs and imaging studies  CBC: Recent Labs  Lab 09/19/23 1054 09/20/23 0530 09/21/23 0623 09/22/23 1101 09/23/23 0740  WBC 14.9* 9.9 7.6  --  7.6  NEUTROABS 12.0*  --   --   --   --   HGB 13.4 11.8* 11.5* 13.4 12.3*  HCT 38.7* 35.7* 34.2* 39.3 36.3*  MCV 96.8 102.3* 99.7  --  99.5  PLT 337 244 266  --  315   Basic Metabolic Panel: Recent Labs  Lab 09/19/23 1054 09/20/23 0530 09/21/23 0623 09/23/23 0740  NA 136 139 136 136  K 4.4 3.6 3.8 3.9  CL 101 107 103 103  CO2 21* 22 25 24   GLUCOSE 136* 106* 114* 121*  BUN 52* 50* 35* 28*  CREATININE 2.03* 1.43* 1.33* 1.31*  CALCIUM 10.6* 9.1 9.4 9.6   GFR: Estimated Creatinine Clearance: 50.4 mL/min (A) (by C-G formula based on SCr of 1.31 mg/dL (H)). Liver Function Tests: Recent Labs  Lab 09/19/23 1054  AST 74*  ALT 35  ALKPHOS 45  BILITOT 1.5*  PROT 8.1  ALBUMIN 4.4   No results for input(s): "LIPASE", "AMYLASE" in the last 168 hours. No results for input(s): "AMMONIA" in the last 168 hours. Coagulation Profile: No results for input(s): "INR", "PROTIME" in the last 168 hours. Cardiac Enzymes: No results for input(s): "CKTOTAL", "CKMB", "CKMBINDEX", "TROPONINI" in the last 168 hours. BNP (last 3  results) No results for input(s): "PROBNP" in the last 8760 hours. HbA1C: No results for input(s): "HGBA1C" in the last 72 hours. CBG: No results for input(s): "GLUCAP" in the last 168 hours. Lipid Profile: No results for input(s): "CHOL", "HDL", "LDLCALC", "TRIG", "CHOLHDL", "LDLDIRECT" in the last 72 hours. Thyroid Function Tests: No results for input(s): "TSH", "T4TOTAL", "FREET4", "T3FREE", "THYROIDAB" in the last 72 hours. Anemia Panel: No results for input(s): "VITAMINB12", "FOLATE", "FERRITIN", "TIBC", "IRON", "RETICCTPCT" in the last 72 hours. Sepsis Labs: No results for input(s): "PROCALCITON", "LATICACIDVEN" in the last 168 hours.  Recent Results (from the past 240 hours)  Urine Culture (for pregnant, neutropenic or urologic patients or patients with an indwelling urinary catheter)     Status: Abnormal   Collection Time: 09/19/23  5:10 PM   Specimen: Urine, Clean Catch  Result Value Ref Range Status   Specimen Description   Final    URINE, CLEAN CATCH Performed at [facility], [address], [place], [place] [address]    Special Requests   Final    NONE Performed at [facility], [address] [place], [place] [address]    Culture (A)  Final    >=100,000 COLONIES/mL ENTEROCOCCUS FAECALIS 40,000 COLONIES/mL METHICILLIN RESISTANT STAPHYLOCOCCUS AUREUS  Report Status 09/22/2023 FINAL  Final   Organism ID, Bacteria ENTEROCOCCUS FAECALIS (A)  Final   Organism ID, Bacteria METHICILLIN RESISTANT STAPHYLOCOCCUS AUREUS (A)  Final      Susceptibility   Enterococcus faecalis - MIC*    AMPICILLIN <=2 SENSITIVE Sensitive     NITROFURANTOIN <=16 SENSITIVE Sensitive     VANCOMYCIN 1 SENSITIVE Sensitive     * >=100,000 COLONIES/mL ENTEROCOCCUS FAECALIS   Methicillin resistant staphylococcus aureus - MIC*    CIPROFLOXACIN >=8 RESISTANT Resistant     GENTAMICIN <=0.5 SENSITIVE Sensitive     NITROFURANTOIN <=16 SENSITIVE Sensitive      OXACILLIN >=4 RESISTANT Resistant     TETRACYCLINE <=1 SENSITIVE Sensitive     VANCOMYCIN 1 SENSITIVE Sensitive     TRIMETH/SULFA <=10 SENSITIVE Sensitive     RIFAMPIN <=0.5 SENSITIVE Sensitive     Inducible Clindamycin NEGATIVE Sensitive     LINEZOLID 2 SENSITIVE Sensitive     * 40,000 COLONIES/mL METHICILLIN RESISTANT STAPHYLOCOCCUS AUREUS  Culture, blood (Routine X 2) w Reflex to ID Panel     Status: None (Preliminary result)   Collection Time: 09/19/23  6:31 PM   Specimen: BLOOD  Result Value Ref Range Status   Specimen Description   Final    BLOOD LEFT ANTECUBITAL Performed at [facility], [address], [place], [place] [address]    Special Requests   Final    BOTTLES DRAWN AEROBIC AND ANAEROBIC Blood Culture adequate volume Performed at [facility], [address], [place], [place] [address]    Culture   Final    NO GROWTH 4 DAYS Performed at [facility], [address], [place], [place] [address]    Report Status PENDING  Incomplete  Culture, blood (Routine X 2) w Reflex to ID Panel     Status: None (Preliminary result)   Collection Time: 09/19/23  8:24 PM   Specimen: BLOOD  Result Value Ref Range Status   Specimen Description   Final    BLOOD BLOOD LEFT HAND Performed at [facility], [address] [place], [place] [address]    Special Requests   Final    BOTTLES DRAWN AEROBIC AND ANAEROBIC Blood Culture results may not be optimal due to an inadequate volume of blood received in culture bottles Performed at [facility], [address] [place], [place] [address]    Culture   Final    NO GROWTH 4 DAYS Performed at [facility], [address], [place], [place] [address]    Report Status PENDING  Incomplete      Radiology Studies: No results found.  Scheduled Meds:  bisacodyl  5 mg Oral Daily   Chlorhexidine Gluconate Cloth  6 each Topical Daily   escitalopram  10  mg Oral Daily   famotidine  40 mg Oral Daily   fenofibrate  160 mg Oral QHS   gabapentin  100 mg Oral QHS   metoprolol succinate  25 mg Oral Daily   nitrofurantoin (macrocrystal-monohydrate)  100 mg Oral Q12H   pantoprazole (PROTONIX) IV  40 mg Intravenous Q12H   polyethylene glycol  17 g Oral BID   potassium citrate  20 mEq Oral BID   pravastatin  40 mg Oral q1800   senna-docusate  1 tablet Oral BID   Continuous Infusions:   LOS: 3 days   Time spent: 35 minutes   Ladan Vanderzanden Estill Cotta, MD [facility]  If 7PM-7AM, please  contact night-coverage [email or web address] 09/23/2023, 2:03 PM

## 2023-09-23 NOTE — Progress Notes (Signed)
   09/23/23 2156  BiPAP/CPAP/SIPAP  BiPAP/CPAP/SIPAP Pt Type Adult  BiPAP/CPAP/SIPAP Resmed  Mask Type Full face mask  Mask Size Large  Respiratory Rate 17 breaths/min  FiO2 (%) 21 %  Patient Home Equipment No  Auto Titrate Yes (previous night settings 20-4)  CPAP/SIPAP surface wiped down Yes  BiPAP/CPAP /SiPAP Vitals  SpO2 96 %  Bilateral Breath Sounds Clear;Diminished

## 2023-09-23 NOTE — Progress Notes (Signed)
OT Cancellation Note  Patient Details Name: Jon Bush MRN: [id number] DOB: [birth year]   Cancelled Treatment:    Reason Eval/Treat Not Completed: Other (comment) Patient having just worked with mobility specialist and returned to bed. Patient requesting to rest at this time. OT to continue to follow and check back as schedule will allow.  Rosalio Loud, MS Acute Rehabilitation Department Office# [phone]  09/23/2023, 3:06 PM

## 2023-09-23 NOTE — Progress Notes (Signed)
Mobility Specialist - Progress Note   09/23/23 1456  Mobility  Activity Ambulated with assistance in hallway  Level of Assistance Contact guard assist, steadying assist  Assistive Device None  Distance Ambulated (ft) 500 ft  Activity Response Tolerated well  Mobility Referral Yes  Mobility visit 1 Mobility  Mobility Specialist Start Time (ACUTE ONLY) 1434  Mobility Specialist Stop Time (ACUTE ONLY) 1455  Mobility Specialist Time Calculation (min) (ACUTE ONLY) 21 min   Pt received in bed and agreeable to mobility. No complaints during session. Pt to bed after session with all needs met.    [facility]

## 2023-09-23 NOTE — Progress Notes (Signed)
   09/22/23 2300  BiPAP/CPAP/SIPAP  BiPAP/CPAP/SIPAP Pt Type Adult  BiPAP/CPAP/SIPAP Resmed  Mask Type Full face mask  Mask Size Large  FiO2 (%) 21 %  Patient Home Equipment No  Auto Titrate Yes (4-20cmH2O)

## 2023-09-24 DIAGNOSIS — N3000 Acute cystitis without hematuria: Secondary | ICD-10-CM | POA: Diagnosis not present

## 2023-09-24 LAB — CBC
HCT: 38.3 % — ABNORMAL LOW (ref 39.0–52.0)
Hemoglobin: 13 g/dL (ref 13.0–17.0)
MCH: 33.6 pg (ref 26.0–34.0)
MCHC: 33.9 g/dL (ref 30.0–36.0)
MCV: 99 fL (ref 80.0–100.0)
Platelets: 328 10*3/uL (ref 150–400)
RBC: 3.87 MIL/uL — ABNORMAL LOW (ref 4.22–5.81)
RDW: 12.1 % (ref 11.5–15.5)
WBC: 8.9 10*3/uL (ref 4.0–10.5)
nRBC: 0 % (ref 0.0–0.2)

## 2023-09-24 LAB — BASIC METABOLIC PANEL
Anion gap: 9 (ref 5–15)
BUN: 30 mg/dL — ABNORMAL HIGH (ref 8–23)
CO2: 26 mmol/L (ref 22–32)
Calcium: 9.8 mg/dL (ref 8.9–10.3)
Chloride: 101 mmol/L (ref 98–111)
Creatinine, Ser: 1.38 mg/dL — ABNORMAL HIGH (ref 0.61–1.24)
GFR, Estimated: 52 mL/min — ABNORMAL LOW (ref >60)
Glucose, Bld: 121 mg/dL — ABNORMAL HIGH (ref 70–99)
Potassium: 4.4 mmol/L (ref 3.5–5.1)
Sodium: 136 mmol/L (ref 135–145)

## 2023-09-24 LAB — CULTURE, BLOOD (ROUTINE X 2)
Culture: NO GROWTH
Culture: NO GROWTH
Special Requests: ADEQUATE

## 2023-09-24 MED ORDER — SIMETHICONE 80 MG PO CHEW: 80.0000 mg | CHEWABLE_TABLET | Freq: Four times a day (QID) | ORAL | 0 refills | Status: AC | PRN

## 2023-09-24 MED ORDER — POLYETHYLENE GLYCOL 3350 17 G PO PACK: 17.0000 g | PACK | Freq: Two times a day (BID) | ORAL | 0 refills | Status: AC

## 2023-09-24 MED ORDER — LACTULOSE 10 GM/15ML PO SOLN: 30.0000 g | Freq: Two times a day (BID) | ORAL | 0 refills | Status: AC | PRN

## 2023-09-24 MED ORDER — PANTOPRAZOLE SODIUM 40 MG PO TBEC
40.0000 mg | DELAYED_RELEASE_TABLET | Freq: Every day | ORAL | Status: DC
  Administered 2023-09-24: 40 mg via ORAL
  Filled 2023-09-24: qty 1

## 2023-09-24 MED ORDER — SENNOSIDES-DOCUSATE SODIUM 8.6-50 MG PO TABS: 1.0000 | ORAL_TABLET | Freq: Two times a day (BID) | ORAL | 0 refills | Status: AC

## 2023-09-24 MED ORDER — MELATONIN 5 MG PO TABS
5.0000 mg | ORAL_TABLET | Freq: Every evening | ORAL | Status: DC | PRN
  Administered 2023-09-24: 5 mg via ORAL
  Filled 2023-09-24: qty 1

## 2023-09-24 MED ORDER — NITROFURANTOIN MONOHYD MACRO 100 MG PO CAPS: 100.0000 mg | ORAL_CAPSULE | Freq: Two times a day (BID) | ORAL | 0 refills | Status: AC

## 2023-09-24 MED ORDER — PANTOPRAZOLE SODIUM 40 MG PO TBEC: 40.0000 mg | DELAYED_RELEASE_TABLET | Freq: Every day | ORAL | 0 refills | Status: AC

## 2023-09-24 MED ORDER — BISACODYL 5 MG PO TBEC: 5.0000 mg | DELAYED_RELEASE_TABLET | Freq: Every day | ORAL | 0 refills | Status: AC

## 2023-09-24 NOTE — Progress Notes (Deleted)
Physician Discharge Summary  Elsie Sakuma ZOX:[id number] DOB: 1945-09-20 DOA: 09/19/2023  PCP: Madaline Guthrie, MD  Admit date: 09/19/2023 Discharge date: 09/24/2023  Admitted From: Home Disposition: SNF  Recommendations for Outpatient Follow-up:  Follow up with PCP in 1-2 weeks Please obtain BMP/CBC in one week [facility] Follow-up on surgical pathology report  Home Health: None Equipment/Devices: None Discharge Condition: Able CODE STATUS: Full code Diet recommendation: Heart healthy diet  Brief/Interim Summary:  78 y.o. M with HTN, HLD, CKD IIIb baseline 2.1-2.2, OSA on CPAP and urinary retention who presented from home after multiple falls.  Patient found to have urinary retention, difficulty placing Foley, urology consulted for Foley placement with plan for outpatient follow-up.  Notable black stool noted at intake concerning for GI bleed with FOBT positive labs.   At this time patient is tentatively planned for upper endoscopy to rule out source for melena, lower endoscopy last September notable for polyps and hemorrhoids as well as diverticulosis.  Pending results of upper endoscopy patient may require pill endoscopy versus outpatient follow-up.  In the interim patient urine culture notably positive for Enterococcus faecalis, transition from ceftriaxone to amoxicillin for 3-day course.  Disposition pending findings as above.  Acute on chronic normocytic anemia  -GI consulted given hemoglobin drop with ongoing black stool -Upper endoscopy on 1/30 with concern of gastritis and duodenitis-biopsies were taken.  Patient also had capsule endoscopy 1/31 which was negative for any acute concern -Colonoscopy done on 05/18/2023 that showed colon polyps, internal hemorrhoids and sigmoid diverticulosis. -cont PPI -Per GI: No signs of active GI blood loss.  If further anemia persist consider follow-up with his primary GI outpatient.   Continue bowel regimen with MiraLAX,  lactulose and Dulcolax.  GI signed off.   UTI, POA secondary to Enterococcus faecalis -Patient does not meet sepsis criteria, DC ceftriaxone,  Urine cultures with methicillin-resistant Enterococcus and now started growing MRSA -Antibiotics switched with Macrobid -He received total 6 days of antibiotics while in the hospital.  I will discharge him on Macrobid twice a day for 8 more days to finish 14-day course.   Acute urinary retention History urethral stricture s/p DVIU (for urethral stricture?) BPH s/p PVP  -Urology were consulted and placed a foley.  -Outpatient follow-up with Dr. Kyla Balzarine Urology for voiding trial after discharge Continue Foley at discharge   Acute metabolic encephalopathy, resolving Mild cognitive impairment -Likely secondary to UTI, now at baseline -Baseline cognitive impairment per neurologist at [facility]  -recommend outpatient follow-up   OSA (obstructive sleep apnea) -CPAP at night   CKD (chronic kidney disease), stage IIIb (HCC) Volume overload -Creatinine at baseline.   Hypercalcemia -Resolved   Essential hypertension -BP normal -Continued metoprolol   Hyperlipidemia -Continued pravastatin, fibrate  Discharge Diagnoses:  Acute on chronic normocytic anemia UTI secondary to Enterococcus faecalis Acute urinary retention History of ureteral stricture status post DVIU for urethral stricture BPH  Acute metabolic encephalopathy Mild cognitive impairment OSA CKD stage IIIb Volume overload Hypercalcemia Essential hypertension Hyperlipidemia    Discharge Instructions  Discharge Instructions     Increase activity slowly   Complete by: As directed       Allergies as of 09/24/2023       Reactions   Benzonatate Other (See Comments)   Other reaction(s): Other (See Comments) Made him feel "weird". Made him feel "weird". Made him feel "weird". Made him feel "weird".   Codeine Nausea And  Vomiting  Medication List     TAKE these medications    acetaminophen 500 MG tablet Commonly known as: TYLENOL Take 2 tablets (1,000 mg total) by mouth every 8 (eight) hours as needed for mild pain.   bisacodyl 5 MG EC tablet Commonly known as: DULCOLAX Take 1 tablet (5 mg total) by mouth daily. Start taking on: September 25, 2023   calcium carbonate 1250 (500 Ca) MG chewable tablet Commonly known as: OS-CAL Chew 1 tablet by mouth daily.   cholecalciferol 25 MCG (1000 UNIT) tablet Commonly known as: VITAMIN D3 Take 2,000 Units by mouth daily.   Cyanocobalamin 1000 MCG Tbcr Take 1,000 mcg by mouth daily.   diclofenac sodium 1 % Gel Commonly known as: VOLTAREN Apply 1 application topically 3 (three) times daily as needed (pain).   escitalopram 10 MG tablet Commonly known as: LEXAPRO Take 1 tablet by mouth daily.   famotidine 40 MG tablet Commonly known as: PEPCID Take 40 mg by mouth daily.   fenofibrate 160 MG tablet Take 160 mg by mouth at bedtime.   ferrous sulfate 325 (65 FE) MG tablet Take 325 mg by mouth daily with breakfast.   folic acid 1 MG tablet Commonly known as: FOLVITE Take 1 mg by mouth daily.   gabapentin 100 MG capsule Commonly known as: NEURONTIN Take 100 mg by mouth at bedtime.   lactulose 10 GM/15ML solution Commonly known as: CHRONULAC Take 45 mLs (30 g total) by mouth 2 (two) times daily as needed for mild constipation or moderate constipation.   magnesium gluconate 500 MG tablet Commonly known as: MAGONATE Take 500 mg by mouth daily.   Melatonin 10 MG Tabs Take 10 mg by mouth at bedtime.   metoprolol succinate 25 MG 24 hr tablet Commonly known as: TOPROL-XL Take 25 mg by mouth daily.   MULTIVITAMIN MEN PO Take 1 tablet by mouth daily.   nitrofurantoin (macrocrystal-monohydrate) 100 MG capsule Commonly known as: MACROBID Take 1 capsule (100 mg total) by mouth every 12 (twelve) hours for 8 days.   pantoprazole 40 MG  tablet Commonly known as: PROTONIX Take 1 tablet (40 mg total) by mouth daily. Start taking on: September 25, 2023   polyethylene glycol 17 g packet Commonly known as: MIRALAX / GLYCOLAX Take 17 g by mouth 2 (two) times daily.   potassium citrate 10 MEQ (1080 MG) SR tablet Commonly known as: UROCIT-K Take 20 mEq by mouth 2 (two) times daily.   pravastatin 40 MG tablet Commonly known as: PRAVACHOL Take 40 mg by mouth at bedtime.   senna-docusate 8.6-50 MG tablet Commonly known as: Senokot-S Take 1 tablet by mouth 2 (two) times daily.   simethicone 80 MG chewable tablet Commonly known as: MYLICON Chew 1 tablet (80 mg total) by mouth every 6 (six) hours as needed for flatulence.        Allergies  Allergen Reactions   Benzonatate Other (See Comments)    Other reaction(s): Other (See Comments) Made him feel "weird". Made him feel "weird". Made him feel "weird". Made him feel "weird".    Codeine Nausea And Vomiting    Consultations: Urology GI   Procedures/Studies: CT ABDOMEN PELVIS WO CONTRAST Result Date: 09/19/2023 CLINICAL DATA:  Left lower quadrant pain EXAM: CT ABDOMEN AND PELVIS WITHOUT CONTRAST TECHNIQUE: Multidetector CT imaging of the abdomen and pelvis was performed following the standard protocol without IV contrast. RADIATION DOSE REDUCTION: This exam was performed according to the departmental dose-optimization program which includes automated exposure control, adjustment of the  mA and/or kV according to patient size and/or use of iterative reconstruction technique. COMPARISON:  CT 01/10/2023 and older. FINDINGS: Lower chest: Coronary calcifications are seen. Small hiatal hernia. Linear opacity seen along bases likely scar or atelectasis. No pleural effusion. Calcified nodule in the right lower lobe consistent with old granulomatous disease. Additional punctate nodules which are noncalcified in the middle lobe on series 56, image 3. These are unchanged going back  to a chest CT scan of 03/03/2020 demonstrating long-term stability. Hepatobiliary: Fatty liver infiltration. No space-occupying liver lesion on this noncontrast examination. Gallbladder is non dilated. Pancreas: Mild global atrophy of the pancreas.  No obvious mass. Spleen: Normal in size without focal abnormality. Adrenals/Urinary Tract: Adrenal glands are preserved. No renal collecting system dilatation or ureteral stones. Bilateral nonobstructing intrarenal stones identified measuring up to 3 mm left lower pole and some punctate areas on the right. There is also a cyst along the upper pole left kidney extending medial with diameter of 4.1 cm and Hounsfield of 20. Not significant changed from previous. There is small right-sided renal cyst as well in the midportion with Hounsfield unit of 22 and diameter of 19 mm. Also unchanged. Distended urinary bladder. Stomach/Bowel: On this non oral contrast exam large bowel is normal course and caliber with scattered colonic stool. Few scattered colonic diverticula. Normal appendix. There is slight wall thickening along the rectum with adjacent stranding. Please correlate for any symptoms such as infectious or inflammatory process. There is also some thickening in the area of the anal canal. Please correlate clinical findings. Stomach is decompressed. The small bowel is nondilated. Vascular/Lymphatic: Aortic atherosclerosis. No enlarged abdominal or pelvic lymph nodes. Reproductive: Prostate is unremarkable. Other: Small fat containing left inguinal hernia. No free air or free fluid. Small umbilical hernia. Musculoskeletal: Curvature and degenerative changes along the spine and pelvis. Trace anterolisthesis of L4 on L5 and L5-S1 with endplate osteophytes. IMPRESSION: Nonobstructing lower pole bilateral renal stones. No ureteral stones or collecting system dilatation. There is some wall thickening along the rectum and anal region with some stranding. Please correlate for  clinical findings. This would have a broad differential. Please correlate for any infectious or inflammatory process or underlying lesion. Small hiatal hernia. Few colonic diverticula.  Normal appendix Electronically Signed   By: Karen Kays M.D.   On: 09/19/2023 16:08   CT Head Wo Contrast Result Date: 09/19/2023 CLINICAL DATA:  Neck trauma.  Multiple falls yesterday. EXAM: CT HEAD WITHOUT CONTRAST CT CERVICAL SPINE WITHOUT CONTRAST TECHNIQUE: Multidetector CT imaging of the head and cervical spine was performed following the standard protocol without intravenous contrast. Multiplanar CT image reconstructions of the cervical spine were also generated. RADIATION DOSE REDUCTION: This exam was performed according to the departmental dose-optimization program which includes automated exposure control, adjustment of the mA and/or kV according to patient size and/or use of iterative reconstruction technique. COMPARISON:  None Available. FINDINGS: CT HEAD FINDINGS Brain: No evidence of acute infarction, hemorrhage, hydrocephalus, extra-axial collection or mass lesion/mass effect. Generalized brain atrophy. Vascular: No hyperdense vessel or unexpected calcification. Skull: Normal. Negative for fracture or focal lesion. Sinuses/Orbits: No acute finding. CT CERVICAL SPINE FINDINGS Alignment: Normal. Skull base and vertebrae: No acute fracture. No primary bone lesion or focal pathologic process. Chronic T1 spinous process fracture. Soft tissues and spinal canal: No prevertebral fluid or swelling. No visible canal hematoma. Disc levels:  Degenerative facet spurring asymmetric to the right Upper chest: Clear apical lungs. IMPRESSION: No evidence of acute intracranial or cervical spine  injury. Electronically Signed   By: Tiburcio Pea M.D.   On: 09/19/2023 12:17   CT Cervical Spine Wo Contrast Result Date: 09/19/2023 CLINICAL DATA:  Neck trauma.  Multiple falls yesterday. EXAM: CT HEAD WITHOUT CONTRAST CT CERVICAL SPINE  WITHOUT CONTRAST TECHNIQUE: Multidetector CT imaging of the head and cervical spine was performed following the standard protocol without intravenous contrast. Multiplanar CT image reconstructions of the cervical spine were also generated. RADIATION DOSE REDUCTION: This exam was performed according to the departmental dose-optimization program which includes automated exposure control, adjustment of the mA and/or kV according to patient size and/or use of iterative reconstruction technique. COMPARISON:  None Available. FINDINGS: CT HEAD FINDINGS Brain: No evidence of acute infarction, hemorrhage, hydrocephalus, extra-axial collection or mass lesion/mass effect. Generalized brain atrophy. Vascular: No hyperdense vessel or unexpected calcification. Skull: Normal. Negative for fracture or focal lesion. Sinuses/Orbits: No acute finding. CT CERVICAL SPINE FINDINGS Alignment: Normal. Skull base and vertebrae: No acute fracture. No primary bone lesion or focal pathologic process. Chronic T1 spinous process fracture. Soft tissues and spinal canal: No prevertebral fluid or swelling. No visible canal hematoma. Disc levels:  Degenerative facet spurring asymmetric to the right Upper chest: Clear apical lungs. IMPRESSION: No evidence of acute intracranial or cervical spine injury. Electronically Signed   By: Tiburcio Pea M.D.   On: 09/19/2023 12:17   DG Pelvis 1-2 Views Result Date: 09/19/2023 CLINICAL DATA:  Multiple falls.  Constipation. EXAM: PELVIS - 1-2 VIEW COMPARISON:  None Available. FINDINGS: No acute fracture or dislocation. Mild osteopenia. The soft tissues are unremarkable. IMPRESSION: 1. No acute fracture or dislocation. 2. Mild osteopenia. Electronically Signed   By: Elgie Collard M.D.   On: 09/19/2023 12:11      Subjective: Patient seen and examined.  Denied any new complaints.  Remained afebrile.  No acute events overnight.  Discharge Exam: Vitals:   09/24/23 0538 09/24/23 1121  BP: 136/73  139/71  Pulse: (!) 59 (!) 57  Resp: 16   Temp: (!) 97.4 F (36.3 C)   SpO2: 98%    Vitals:   09/23/23 2030 09/23/23 2156 09/24/23 0538 09/24/23 1121  BP: 139/68  136/73 139/71  Pulse: 72  (!) 59 (!) 57  Resp: 16  16   Temp: 97.6 F (36.4 C)  (!) 97.4 F (36.3 C)   TempSrc: Oral  Oral   SpO2: 96% 96% 98%   Weight:      Height:        General: Pt is alert, awake, not in acute distress, on room air, communicating well Cardiovascular: RRR, S1/S2 +, no rubs, no gallops Respiratory: CTA bilaterally, no wheezing, no rhonchi Abdominal: Soft, NT, ND, bowel sounds + Extremities: no edema, no cyanosis    The results of significant diagnostics from this hospitalization (including imaging, microbiology, ancillary and laboratory) are listed below for reference.     Microbiology: Recent Results (from the past 240 hours)  Urine Culture (for pregnant, neutropenic or urologic patients or patients with an indwelling urinary catheter)     Status: Abnormal   Collection Time: 09/19/23  5:10 PM   Specimen: Urine, Clean Catch  Result Value Ref Range Status   Specimen Description   Final    URINE, CLEAN CATCH Performed at [facility], [address], [place], [place] [address]    Special Requests   Final    NONE Performed at [facility], [address] [place], [place] [address]    Culture (A)  Final    >=100,000 COLONIES/mL ENTEROCOCCUS FAECALIS 40,000 COLONIES/mL METHICILLIN RESISTANT STAPHYLOCOCCUS AUREUS    Report Status 09/22/2023 FINAL  Final   Organism ID, Bacteria ENTEROCOCCUS FAECALIS (A)  Final   Organism ID, Bacteria METHICILLIN RESISTANT STAPHYLOCOCCUS AUREUS (A)  Final      Susceptibility   Enterococcus faecalis - MIC*    AMPICILLIN <=2 SENSITIVE Sensitive     NITROFURANTOIN <=16 SENSITIVE Sensitive     VANCOMYCIN 1 SENSITIVE Sensitive     * >=100,000 COLONIES/mL ENTEROCOCCUS FAECALIS   Methicillin resistant staphylococcus aureus  - MIC*    CIPROFLOXACIN >=8 RESISTANT Resistant     GENTAMICIN <=0.5 SENSITIVE Sensitive     NITROFURANTOIN <=16 SENSITIVE Sensitive     OXACILLIN >=4 RESISTANT Resistant     TETRACYCLINE <=1 SENSITIVE Sensitive     VANCOMYCIN 1 SENSITIVE Sensitive     TRIMETH/SULFA <=10 SENSITIVE Sensitive     RIFAMPIN <=0.5 SENSITIVE Sensitive     Inducible Clindamycin NEGATIVE Sensitive     LINEZOLID 2 SENSITIVE Sensitive     * 40,000 COLONIES/mL METHICILLIN RESISTANT STAPHYLOCOCCUS AUREUS  Culture, blood (Routine X 2) w Reflex to ID Panel     Status: None   Collection Time: 09/19/23  6:31 PM   Specimen: BLOOD  Result Value Ref Range Status   Specimen Description   Final    BLOOD LEFT ANTECUBITAL Performed at [facility], [address], [place], [place] [address]    Special Requests   Final    BOTTLES DRAWN AEROBIC AND ANAEROBIC Blood Culture adequate volume Performed at [facility], [address] [place], [place] [address]    Culture   Final    NO GROWTH 5 DAYS Performed at [facility], [address], [place], [place] [address]    Report Status 09/24/2023 FINAL  Final  Culture, blood (Routine X 2) w Reflex to ID Panel     Status: None   Collection Time: 09/19/23  8:24 PM   Specimen: BLOOD  Result Value Ref Range Status   Specimen Description   Final    BLOOD BLOOD LEFT HAND Performed at [facility], [address] [place], [place] [address]    Special Requests   Final    BOTTLES DRAWN AEROBIC AND ANAEROBIC Blood Culture results may not be optimal due to an inadequate volume of blood received in culture bottles Performed at [facility], [address], [place], [place] [address]    Culture   Final    NO GROWTH 5 DAYS Performed at [facility], [address], [place], [place] [address]    Report Status 09/24/2023 FINAL  Final     Labs: BNP (last 3 results) No results for input(s):  "BNP" in the last 8760 hours. Basic Metabolic Panel: Recent Labs  Lab 09/19/23 1054 09/20/23 0530 09/21/23 0623 09/23/23 0740 09/24/23 0635  NA 136 139 136 136 136  K 4.4 3.6 3.8 3.9 4.4  CL 101 107 103 103 101  CO2 21* 22 25 24 26   GLUCOSE 136* 106* 114* 121* 121*  BUN 52* 50* 35* 28* 30*  CREATININE 2.03* 1.43* 1.33* 1.31* 1.38*  CALCIUM 10.6* 9.1 9.4 9.6 9.8   Liver Function Tests: Recent Labs  Lab 09/19/23 1054  AST 74*  ALT 35  ALKPHOS 45  BILITOT 1.5*  PROT 8.1  ALBUMIN 4.4   No results for input(s): "LIPASE", "AMYLASE" in the last 168 hours. No results  for input(s): "AMMONIA" in the last 168 hours. CBC: Recent Labs  Lab 09/19/23 1054 09/20/23 0530 09/21/23 0623 09/22/23 1101 09/23/23 0740 09/24/23 0635  WBC 14.9* 9.9 7.6  --  7.6 8.9  NEUTROABS 12.0*  --   --   --   --   --   HGB 13.4 11.8* 11.5* 13.4 12.3* 13.0  HCT 38.7* 35.7* 34.2* 39.3 36.3* 38.3*  MCV 96.8 102.3* 99.7  --  99.5 99.0  PLT 337 244 266  --  315 328   Cardiac Enzymes: No results for input(s): "CKTOTAL", "CKMB", "CKMBINDEX", "TROPONINI" in the last 168 hours. BNP: Invalid input(s): "POCBNP" CBG: No results for input(s): "GLUCAP" in the last 168 hours. D-Dimer No results for input(s): "DDIMER" in the last 72 hours. Hgb A1c No results for input(s): "HGBA1C" in the last 72 hours. Lipid Profile No results for input(s): "CHOL", "HDL", "LDLCALC", "TRIG", "CHOLHDL", "LDLDIRECT" in the last 72 hours. Thyroid function studies No results for input(s): "TSH", "T4TOTAL", "T3FREE", "THYROIDAB" in the last 72 hours.  Invalid input(s): "FREET3" Anemia work up No results for input(s): "VITAMINB12", "FOLATE", "FERRITIN", "TIBC", "IRON", "RETICCTPCT" in the last 72 hours. Urinalysis    Component Value Date/Time   COLORURINE YELLOW 09/19/2023 1710   APPEARANCEUR CLEAR 09/19/2023 1710   LABSPEC 1.021 09/19/2023 1710   PHURINE 6.0 09/19/2023 1710   GLUCOSEU NEGATIVE 09/19/2023 1710   HGBUR  SMALL (A) 09/19/2023 1710   BILIRUBINUR NEGATIVE 09/19/2023 1710   KETONESUR NEGATIVE 09/19/2023 1710   PROTEINUR 100 (A) 09/19/2023 1710   NITRITE NEGATIVE 09/19/2023 1710   LEUKOCYTESUR SMALL (A) 09/19/2023 1710   Sepsis Labs Recent Labs  Lab 09/20/23 0530 09/21/23 0623 09/23/23 0740 09/24/23 0635  WBC 9.9 7.6 7.6 8.9   Microbiology Recent Results (from the past 240 hours)  Urine Culture (for pregnant, neutropenic or urologic patients or patients with an indwelling urinary catheter)     Status: Abnormal   Collection Time: 09/19/23  5:10 PM   Specimen: Urine, Clean Catch  Result Value Ref Range Status   Specimen Description   Final    URINE, CLEAN CATCH Performed at [facility], [address], [place], [place] [address]    Special Requests   Final    NONE Performed at [facility], [address], [place], [place] [address]    Culture (A)  Final    >=100,000 COLONIES/mL ENTEROCOCCUS FAECALIS 40,000 COLONIES/mL METHICILLIN RESISTANT STAPHYLOCOCCUS AUREUS    Report Status 09/22/2023 FINAL  Final   Organism ID, Bacteria ENTEROCOCCUS FAECALIS (A)  Final   Organism ID, Bacteria METHICILLIN RESISTANT STAPHYLOCOCCUS AUREUS (A)  Final      Susceptibility   Enterococcus faecalis - MIC*    AMPICILLIN <=2 SENSITIVE Sensitive     NITROFURANTOIN <=16 SENSITIVE Sensitive     VANCOMYCIN 1 SENSITIVE Sensitive     * >=100,000 COLONIES/mL ENTEROCOCCUS FAECALIS   Methicillin resistant staphylococcus aureus - MIC*    CIPROFLOXACIN >=8 RESISTANT Resistant     GENTAMICIN <=0.5 SENSITIVE Sensitive     NITROFURANTOIN <=16 SENSITIVE Sensitive     OXACILLIN >=4 RESISTANT Resistant     TETRACYCLINE <=1 SENSITIVE Sensitive     VANCOMYCIN 1 SENSITIVE Sensitive     TRIMETH/SULFA <=10 SENSITIVE Sensitive     RIFAMPIN <=0.5 SENSITIVE Sensitive     Inducible Clindamycin NEGATIVE Sensitive     LINEZOLID 2 SENSITIVE Sensitive     * 40,000 COLONIES/mL  METHICILLIN RESISTANT STAPHYLOCOCCUS AUREUS  Culture, blood (Routine X  2) w Reflex to ID Panel     Status: None   Collection Time: 09/19/23  6:31 PM   Specimen: BLOOD  Result Value Ref Range Status   Specimen Description   Final    BLOOD LEFT ANTECUBITAL Performed at [facility], [address] [place], [place] [address]    Special Requests   Final    BOTTLES DRAWN AEROBIC AND ANAEROBIC Blood Culture adequate volume Performed at [facility], [address] [place], [place] [address]    Culture   Final    NO GROWTH 5 DAYS Performed at [facility], [address], [place], [place] [address]    Report Status 09/24/2023 FINAL  Final  Culture, blood (Routine X 2) w Reflex to ID Panel     Status: None   Collection Time: 09/19/23  8:24 PM   Specimen: BLOOD  Result Value Ref Range Status   Specimen Description   Final    BLOOD BLOOD LEFT HAND Performed at [facility], [address] [place], [place] [address]    Special Requests   Final    BOTTLES DRAWN AEROBIC AND ANAEROBIC Blood Culture results may not be optimal due to an inadequate volume of blood received in culture bottles Performed at [facility], [address], [place], [place] [address]    Culture   Final    NO GROWTH 5 DAYS Performed at [facility], [address] [place], [place] [address]    Report Status 09/24/2023 FINAL  Final     Time coordinating discharge: Over 30 minutes  SIGNED:   Ollen Bowl, MD  [facility] 09/24/2023, 1:33 PM Pager   If 7PM-7AM, please contact night-coverage [email or web address]

## 2023-09-24 NOTE — TOC Progression Note (Addendum)
Transition of Care [facility]) - Progression Note    Patient Details  Name: Jon Bush MRN: [id number] Date of Birth: 1946-06-23  Transition of Care [facility]) CM/SW Contact  Epifanio Lesches, RN Phone Number: 09/24/2023, 12:09 PM  Clinical Narrative:    NCM called [facility] liaison Lerry Liner (weekend coverage) at [phone] to learn of bed availability for today. Voice messages left x 2, awaiting response.  TOC team monitoring and will assist with needs...   Expected Discharge Plan: Skilled Nursing Facility Barriers to Discharge: Other (must enter comment) (awaiting bed availability)  Expected Discharge Plan and Services In-house Referral: NA Discharge Planning Services: CM Consult Post Acute Care Choice: Skilled Nursing Facility Living arrangements for the past 2 months: Single Family Home                 DME Arranged: N/A DME Agency: NA       HH Arranged: NA HH Agency: NA         Social Determinants of Health (SDOH) Interventions SDOH Screenings   Food Insecurity: No Food Insecurity (09/20/2023)  Housing: Low Risk  (09/21/2023)  Transportation Needs: No Transportation Needs (09/20/2023)  Utilities: Not At Risk (09/20/2023)  Social Connections: Patient Declined (09/20/2023)  Tobacco Use: Low Risk  (09/21/2023)    Readmission Risk Interventions     No data to display

## 2023-09-24 NOTE — Discharge Summary (Signed)
Physician Discharge Summary  Jon Bush ZOX:[id number] DOB: 03/03/1946 DOA: 09/19/2023  PCP: Madaline Guthrie, MD  Admit date: 09/19/2023 Discharge date: 09/24/2023  Admitted From: Home Disposition: SNF   Recommendations for Outpatient Follow-up:  Follow up with PCP in 1-2 weeks Please obtain BMP/CBC in one week [facility] Follow-up on surgical pathology report   Home Health: None Equipment/Devices: None Discharge Condition: Able CODE STATUS: Full code Diet recommendation: Heart healthy diet   Brief/Interim Summary:   78 y.o. M with HTN, HLD, CKD IIIb baseline 2.1-2.2, OSA on CPAP and urinary retention who presented from home after multiple falls.  Patient found to have urinary retention, difficulty placing Foley, urology consulted for Foley placement with plan for outpatient follow-up.  Notable black stool noted at intake concerning for GI bleed with FOBT positive labs.   At this time patient is tentatively planned for upper endoscopy to rule out source for melena, lower endoscopy last September notable for polyps and hemorrhoids as well as diverticulosis.  Pending results of upper endoscopy patient may require pill endoscopy versus outpatient follow-up.  In the interim patient urine culture notably positive for Enterococcus faecalis, transition from ceftriaxone to amoxicillin for 3-day course.  Disposition pending findings as above.   Acute on chronic normocytic anemia  -GI consulted given hemoglobin drop with ongoing black stool -Upper endoscopy on 1/30 with concern of gastritis and duodenitis-biopsies were taken.  Patient also had capsule endoscopy 1/31 which was negative for any acute concern -Colonoscopy done on 05/18/2023 that showed colon polyps, internal hemorrhoids and sigmoid diverticulosis. -cont PPI -Per GI: No signs of active GI blood loss.  If further anemia persist consider follow-up with his primary GI  outpatient.  Continue bowel regimen with MiraLAX,  lactulose and Dulcolax.  GI signed off.   UTI, POA secondary to Enterococcus faecalis -Patient does not meet sepsis criteria, DC ceftriaxone,  Urine cultures with methicillin-resistant Enterococcus and now started growing MRSA -Antibiotics switched with Macrobid -He received total 6 days of antibiotics while in the hospital.  I will discharge him on Macrobid twice a day for 8 more days to finish 14-day course.   Acute urinary retention History urethral stricture s/p DVIU (for urethral stricture?) BPH s/p PVP  -Urology were consulted and placed a foley.  -Outpatient follow-up with Dr. Kyla Balzarine [facility] for voiding trial after discharge Continue Foley at discharge   Acute metabolic encephalopathy, resolving Mild cognitive impairment -Likely secondary to UTI, now at baseline -Baseline cognitive impairment per neurologist at [facility]  -recommend outpatient follow-up   OSA (obstructive sleep apnea) -CPAP at night   CKD (chronic kidney disease), stage IIIb (HCC) Volume overload -Creatinine at baseline.   Hypercalcemia -Resolved   Essential hypertension -BP normal -Continued metoprolol   Hyperlipidemia -Continued pravastatin, fibrate   Discharge Diagnoses:  Acute on chronic normocytic anemia UTI secondary to Enterococcus faecalis Acute urinary retention History of ureteral stricture status post DVIU for urethral stricture BPH  Acute metabolic encephalopathy Mild cognitive impairment OSA CKD stage IIIb Volume overload Hypercalcemia Essential hypertension Hyperlipidemia    Discharge Instructions  Discharge Instructions     Increase activity slowly   Complete by: As directed       Allergies as of 09/24/2023       Reactions   Benzonatate Other (See Comments)   Other reaction(s): Other (See Comments) Made him feel "weird". Made him feel "weird". Made him feel "weird". Made him feel "weird".   Codeine  Nausea And Vomiting        Medication List     TAKE these medications    acetaminophen 500 MG tablet Commonly known as: TYLENOL Take 2 tablets (1,000 mg total) by mouth every 8 (eight) hours as needed for mild pain.   bisacodyl 5 MG EC tablet Commonly known as: DULCOLAX Take 1 tablet (5 mg total) by mouth daily. Start taking on: September 25, 2023   calcium carbonate 1250 (500 Ca) MG chewable tablet Commonly known as: OS-CAL Chew 1 tablet by mouth daily.   cholecalciferol 25 MCG (1000 UNIT) tablet Commonly known as: VITAMIN D3 Take 2,000 Units by mouth daily.   Cyanocobalamin 1000 MCG Tbcr Take 1,000 mcg by mouth daily.   diclofenac sodium 1 % Gel Commonly known as: VOLTAREN Apply 1 application topically 3 (three) times daily as needed (pain).   escitalopram 10 MG tablet Commonly known as: LEXAPRO Take 1 tablet by mouth daily.   famotidine 40 MG tablet Commonly known as: PEPCID Take 40 mg by mouth daily.   fenofibrate 160 MG tablet Take 160 mg by mouth at bedtime.   ferrous sulfate 325 (65 FE) MG tablet Take 325 mg by mouth daily with breakfast.   folic acid 1 MG tablet Commonly known as: FOLVITE Take 1 mg by mouth daily.   gabapentin 100 MG capsule Commonly known as: NEURONTIN Take 100 mg by mouth at bedtime.   lactulose 10 GM/15ML solution Commonly known as: CHRONULAC Take 45 mLs (30 g total) by mouth 2 (two) times daily as needed for mild constipation or moderate constipation.   magnesium gluconate 500 MG tablet Commonly known as: MAGONATE Take 500 mg by mouth daily.   Melatonin 10 MG Tabs Take 10 mg by mouth at bedtime.   metoprolol succinate 25 MG 24 hr tablet Commonly known as: TOPROL-XL Take 25 mg by mouth daily.   MULTIVITAMIN MEN PO Take 1 tablet by mouth daily.   nitrofurantoin (macrocrystal-monohydrate) 100 MG capsule Commonly known as: MACROBID Take 1 capsule (100 mg total) by mouth every 12 (twelve) hours for 8 days.    pantoprazole 40 MG tablet Commonly known as: PROTONIX Take 1 tablet (40 mg total) by mouth daily. Start taking on: September 25, 2023   polyethylene glycol 17 g packet Commonly known as: MIRALAX / GLYCOLAX Take 17 g by mouth 2 (two) times daily.   potassium citrate 10 MEQ (1080 MG) SR tablet Commonly known as: UROCIT-K Take 20 mEq by mouth 2 (two) times daily.   pravastatin 40 MG tablet Commonly known as: PRAVACHOL Take 40 mg by mouth at bedtime.   senna-docusate 8.6-50 MG tablet Commonly known as: Senokot-S Take 1 tablet by mouth 2 (two) times daily.   simethicone 80 MG chewable tablet Commonly known as: MYLICON Chew 1 tablet (80 mg total) by mouth every 6 (six) hours as needed for flatulence.        Allergies  Allergen Reactions   Benzonatate Other (See Comments)    Other reaction(s): Other (See Comments) Made him feel "weird". Made him feel "weird". Made him feel "weird". Made him feel "weird".    Codeine Nausea And Vomiting    Consultations: Urology GI   Procedures/Studies: CT ABDOMEN PELVIS WO CONTRAST Result Date: 09/19/2023 CLINICAL DATA:  Left lower quadrant pain EXAM: CT ABDOMEN AND PELVIS WITHOUT CONTRAST TECHNIQUE: Multidetector CT imaging of the abdomen and pelvis was performed following the standard protocol without IV contrast. RADIATION DOSE REDUCTION: This exam was performed according to the departmental  dose-optimization program which includes automated exposure control, adjustment of the mA and/or kV according to patient size and/or use of iterative reconstruction technique. COMPARISON:  CT 01/10/2023 and older. FINDINGS: Lower chest: Coronary calcifications are seen. Small hiatal hernia. Linear opacity seen along bases likely scar or atelectasis. No pleural effusion. Calcified nodule in the right lower lobe consistent with old granulomatous disease. Additional punctate nodules which are noncalcified in the middle lobe on series 56, image 3. These are  unchanged going back to a chest CT scan of 03/03/2020 demonstrating long-term stability. Hepatobiliary: Fatty liver infiltration. No space-occupying liver lesion on this noncontrast examination. Gallbladder is non dilated. Pancreas: Mild global atrophy of the pancreas.  No obvious mass. Spleen: Normal in size without focal abnormality. Adrenals/Urinary Tract: Adrenal glands are preserved. No renal collecting system dilatation or ureteral stones. Bilateral nonobstructing intrarenal stones identified measuring up to 3 mm left lower pole and some punctate areas on the right. There is also a cyst along the upper pole left kidney extending medial with diameter of 4.1 cm and Hounsfield of 20. Not significant changed from previous. There is small right-sided renal cyst as well in the midportion with Hounsfield unit of 22 and diameter of 19 mm. Also unchanged. Distended urinary bladder. Stomach/Bowel: On this non oral contrast exam large bowel is normal course and caliber with scattered colonic stool. Few scattered colonic diverticula. Normal appendix. There is slight wall thickening along the rectum with adjacent stranding. Please correlate for any symptoms such as infectious or inflammatory process. There is also some thickening in the area of the anal canal. Please correlate clinical findings. Stomach is decompressed. The small bowel is nondilated. Vascular/Lymphatic: Aortic atherosclerosis. No enlarged abdominal or pelvic lymph nodes. Reproductive: Prostate is unremarkable. Other: Small fat containing left inguinal hernia. No free air or free fluid. Small umbilical hernia. Musculoskeletal: Curvature and degenerative changes along the spine and pelvis. Trace anterolisthesis of L4 on L5 and L5-S1 with endplate osteophytes. IMPRESSION: Nonobstructing lower pole bilateral renal stones. No ureteral stones or collecting system dilatation. There is some wall thickening along the rectum and anal region with some stranding.  Please correlate for clinical findings. This would have a broad differential. Please correlate for any infectious or inflammatory process or underlying lesion. Small hiatal hernia. Few colonic diverticula.  Normal appendix Electronically Signed   By: Karen Kays M.D.   On: 09/19/2023 16:08   CT Head Wo Contrast Result Date: 09/19/2023 CLINICAL DATA:  Neck trauma.  Multiple falls yesterday. EXAM: CT HEAD WITHOUT CONTRAST CT CERVICAL SPINE WITHOUT CONTRAST TECHNIQUE: Multidetector CT imaging of the head and cervical spine was performed following the standard protocol without intravenous contrast. Multiplanar CT image reconstructions of the cervical spine were also generated. RADIATION DOSE REDUCTION: This exam was performed according to the departmental dose-optimization program which includes automated exposure control, adjustment of the mA and/or kV according to patient size and/or use of iterative reconstruction technique. COMPARISON:  None Available. FINDINGS: CT HEAD FINDINGS Brain: No evidence of acute infarction, hemorrhage, hydrocephalus, extra-axial collection or mass lesion/mass effect. Generalized brain atrophy. Vascular: No hyperdense vessel or unexpected calcification. Skull: Normal. Negative for fracture or focal lesion. Sinuses/Orbits: No acute finding. CT CERVICAL SPINE FINDINGS Alignment: Normal. Skull base and vertebrae: No acute fracture. No primary bone lesion or focal pathologic process. Chronic T1 spinous process fracture. Soft tissues and spinal canal: No prevertebral fluid or swelling. No visible canal hematoma. Disc levels:  Degenerative facet spurring asymmetric to the right Upper chest: Clear apical  lungs. IMPRESSION: No evidence of acute intracranial or cervical spine injury. Electronically Signed   By: Tiburcio Pea M.D.   On: 09/19/2023 12:17   CT Cervical Spine Wo Contrast Result Date: 09/19/2023 CLINICAL DATA:  Neck trauma.  Multiple falls yesterday. EXAM: CT HEAD WITHOUT  CONTRAST CT CERVICAL SPINE WITHOUT CONTRAST TECHNIQUE: Multidetector CT imaging of the head and cervical spine was performed following the standard protocol without intravenous contrast. Multiplanar CT image reconstructions of the cervical spine were also generated. RADIATION DOSE REDUCTION: This exam was performed according to the departmental dose-optimization program which includes automated exposure control, adjustment of the mA and/or kV according to patient size and/or use of iterative reconstruction technique. COMPARISON:  None Available. FINDINGS: CT HEAD FINDINGS Brain: No evidence of acute infarction, hemorrhage, hydrocephalus, extra-axial collection or mass lesion/mass effect. Generalized brain atrophy. Vascular: No hyperdense vessel or unexpected calcification. Skull: Normal. Negative for fracture or focal lesion. Sinuses/Orbits: No acute finding. CT CERVICAL SPINE FINDINGS Alignment: Normal. Skull base and vertebrae: No acute fracture. No primary bone lesion or focal pathologic process. Chronic T1 spinous process fracture. Soft tissues and spinal canal: No prevertebral fluid or swelling. No visible canal hematoma. Disc levels:  Degenerative facet spurring asymmetric to the right Upper chest: Clear apical lungs. IMPRESSION: No evidence of acute intracranial or cervical spine injury. Electronically Signed   By: Tiburcio Pea M.D.   On: 09/19/2023 12:17   DG Pelvis 1-2 Views Result Date: 09/19/2023 CLINICAL DATA:  Multiple falls.  Constipation. EXAM: PELVIS - 1-2 VIEW COMPARISON:  None Available. FINDINGS: No acute fracture or dislocation. Mild osteopenia. The soft tissues are unremarkable. IMPRESSION: 1. No acute fracture or dislocation. 2. Mild osteopenia. Electronically Signed   By: Elgie Collard M.D.   On: 09/19/2023 12:11      Subjective: Seen and examined.  Denies any new complaints.  No acute events overnight.  Comfortable going to SNF  Discharge Exam: Vitals:   09/24/23 0538  09/24/23 1121  BP: 136/73 139/71  Pulse: (!) 59 (!) 57  Resp: 16   Temp: (!) 97.4 F (36.3 C)   SpO2: 98%    Vitals:   09/23/23 2030 09/23/23 2156 09/24/23 0538 09/24/23 1121  BP: 139/68  136/73 139/71  Pulse: 72  (!) 59 (!) 57  Resp: 16  16   Temp: 97.6 F (36.4 C)  (!) 97.4 F (36.3 C)   TempSrc: Oral  Oral   SpO2: 96% 96% 98%   Weight:      Height:        General: Pt is alert, awake, not in acute distress, on room air, communicating well Cardiovascular: RRR, S1/S2 +, no rubs, no gallops Respiratory: CTA bilaterally, no wheezing, no rhonchi Abdominal: Soft, NT, ND, bowel sounds + Extremities: no edema, no cyanosis    The results of significant diagnostics from this hospitalization (including imaging, microbiology, ancillary and laboratory) are listed below for reference.     Microbiology: Recent Results (from the past 240 hours)  Urine Culture (for pregnant, neutropenic or urologic patients or patients with an indwelling urinary catheter)     Status: Abnormal   Collection Time: 09/19/23  5:10 PM   Specimen: Urine, Clean Catch  Result Value Ref Range Status   Specimen Description   Final    URINE, CLEAN CATCH Performed at [facility], [address], [place], [place] [address]    Special Requests   Final    NONE Performed at [facility], [address] [place], [place] [address]    Culture (A)  Final    >=100,000 COLONIES/mL ENTEROCOCCUS FAECALIS 40,000 COLONIES/mL METHICILLIN RESISTANT STAPHYLOCOCCUS AUREUS    Report Status 09/22/2023 FINAL  Final   Organism ID, Bacteria ENTEROCOCCUS FAECALIS (A)  Final   Organism ID, Bacteria METHICILLIN RESISTANT STAPHYLOCOCCUS AUREUS (A)  Final      Susceptibility   Enterococcus faecalis - MIC*    AMPICILLIN <=2 SENSITIVE Sensitive     NITROFURANTOIN <=16 SENSITIVE Sensitive     VANCOMYCIN 1 SENSITIVE Sensitive     * >=100,000 COLONIES/mL ENTEROCOCCUS FAECALIS   Methicillin  resistant staphylococcus aureus - MIC*    CIPROFLOXACIN >=8 RESISTANT Resistant     GENTAMICIN <=0.5 SENSITIVE Sensitive     NITROFURANTOIN <=16 SENSITIVE Sensitive     OXACILLIN >=4 RESISTANT Resistant     TETRACYCLINE <=1 SENSITIVE Sensitive     VANCOMYCIN 1 SENSITIVE Sensitive     TRIMETH/SULFA <=10 SENSITIVE Sensitive     RIFAMPIN <=0.5 SENSITIVE Sensitive     Inducible Clindamycin NEGATIVE Sensitive     LINEZOLID 2 SENSITIVE Sensitive     * 40,000 COLONIES/mL METHICILLIN RESISTANT STAPHYLOCOCCUS AUREUS  Culture, blood (Routine X 2) w Reflex to ID Panel     Status: None   Collection Time: 09/19/23  6:31 PM   Specimen: BLOOD  Result Value Ref Range Status   Specimen Description   Final    BLOOD LEFT ANTECUBITAL Performed at [facility], [address], [place], [place] [address]    Special Requests   Final    BOTTLES DRAWN AEROBIC AND ANAEROBIC Blood Culture adequate volume Performed at [facility], [address] [place], [place] [address]    Culture   Final    NO GROWTH 5 DAYS Performed at [facility], [address] [place], [place] [address]    Report Status 09/24/2023 FINAL  Final  Culture, blood (Routine X 2) w Reflex to ID Panel     Status: None   Collection Time: 09/19/23  8:24 PM   Specimen: BLOOD  Result Value Ref Range Status   Specimen Description   Final    BLOOD BLOOD LEFT HAND Performed at [facility], [address] [place], [place] [address]    Special Requests   Final    BOTTLES DRAWN AEROBIC AND ANAEROBIC Blood Culture results may not be optimal due to an inadequate volume of blood received in culture bottles Performed at [facility], [address], [place], [place] [address]    Culture   Final    NO GROWTH 5 DAYS Performed at [facility], [address], [place], [place] [address]    Report Status 09/24/2023 FINAL  Final     Labs: BNP (last 3  results) No results for input(s): "BNP" in the last 8760 hours. Basic Metabolic Panel: Recent Labs  Lab 09/19/23 1054 09/20/23 0530 09/21/23 0623 09/23/23 0740 09/24/23 0635  NA 136 139 136 136 136  K 4.4 3.6 3.8 3.9 4.4  CL 101 107 103 103 101  CO2 21* 22 25 24 26   GLUCOSE 136* 106* 114* 121* 121*  BUN 52* 50* 35* 28* 30*  CREATININE 2.03* 1.43* 1.33* 1.31* 1.38*  CALCIUM 10.6* 9.1 9.4 9.6 9.8   Liver Function Tests: Recent Labs  Lab 09/19/23 1054  AST 74*  ALT 35  ALKPHOS 45  BILITOT 1.5*  PROT 8.1  ALBUMIN 4.4   No  results for input(s): "LIPASE", "AMYLASE" in the last 168 hours. No results for input(s): "AMMONIA" in the last 168 hours. CBC: Recent Labs  Lab 09/19/23 1054 09/20/23 0530 09/21/23 0623 09/22/23 1101 09/23/23 0740 09/24/23 0635  WBC 14.9* 9.9 7.6  --  7.6 8.9  NEUTROABS 12.0*  --   --   --   --   --   HGB 13.4 11.8* 11.5* 13.4 12.3* 13.0  HCT 38.7* 35.7* 34.2* 39.3 36.3* 38.3*  MCV 96.8 102.3* 99.7  --  99.5 99.0  PLT 337 244 266  --  315 328   Cardiac Enzymes: No results for input(s): "CKTOTAL", "CKMB", "CKMBINDEX", "TROPONINI" in the last 168 hours. BNP: Invalid input(s): "POCBNP" CBG: No results for input(s): "GLUCAP" in the last 168 hours. D-Dimer No results for input(s): "DDIMER" in the last 72 hours. Hgb A1c No results for input(s): "HGBA1C" in the last 72 hours. Lipid Profile No results for input(s): "CHOL", "HDL", "LDLCALC", "TRIG", "CHOLHDL", "LDLDIRECT" in the last 72 hours. Thyroid function studies No results for input(s): "TSH", "T4TOTAL", "T3FREE", "THYROIDAB" in the last 72 hours.  Invalid input(s): "FREET3" Anemia work up No results for input(s): "VITAMINB12", "FOLATE", "FERRITIN", "TIBC", "IRON", "RETICCTPCT" in the last 72 hours. Urinalysis    Component Value Date/Time   COLORURINE YELLOW 09/19/2023 1710   APPEARANCEUR CLEAR 09/19/2023 1710   LABSPEC 1.021 09/19/2023 1710   PHURINE 6.0 09/19/2023 1710   GLUCOSEU  NEGATIVE 09/19/2023 1710   HGBUR SMALL (A) 09/19/2023 1710   BILIRUBINUR NEGATIVE 09/19/2023 1710   KETONESUR NEGATIVE 09/19/2023 1710   PROTEINUR 100 (A) 09/19/2023 1710   NITRITE NEGATIVE 09/19/2023 1710   LEUKOCYTESUR SMALL (A) 09/19/2023 1710   Sepsis Labs Recent Labs  Lab 09/20/23 0530 09/21/23 0623 09/23/23 0740 09/24/23 0635  WBC 9.9 7.6 7.6 8.9   Microbiology Recent Results (from the past 240 hours)  Urine Culture (for pregnant, neutropenic or urologic patients or patients with an indwelling urinary catheter)     Status: Abnormal   Collection Time: 09/19/23  5:10 PM   Specimen: Urine, Clean Catch  Result Value Ref Range Status   Specimen Description   Final    URINE, CLEAN CATCH Performed at [facility], [address] [place], [place] [address]    Special Requests   Final    NONE Performed at [facility], [address], [place], [place] [address]    Culture (A)  Final    >=100,000 COLONIES/mL ENTEROCOCCUS FAECALIS 40,000 COLONIES/mL METHICILLIN RESISTANT STAPHYLOCOCCUS AUREUS    Report Status 09/22/2023 FINAL  Final   Organism ID, Bacteria ENTEROCOCCUS FAECALIS (A)  Final   Organism ID, Bacteria METHICILLIN RESISTANT STAPHYLOCOCCUS AUREUS (A)  Final      Susceptibility   Enterococcus faecalis - MIC*    AMPICILLIN <=2 SENSITIVE Sensitive     NITROFURANTOIN <=16 SENSITIVE Sensitive     VANCOMYCIN 1 SENSITIVE Sensitive     * >=100,000 COLONIES/mL ENTEROCOCCUS FAECALIS   Methicillin resistant staphylococcus aureus - MIC*    CIPROFLOXACIN >=8 RESISTANT Resistant     GENTAMICIN <=0.5 SENSITIVE Sensitive     NITROFURANTOIN <=16 SENSITIVE Sensitive     OXACILLIN >=4 RESISTANT Resistant     TETRACYCLINE <=1 SENSITIVE Sensitive     VANCOMYCIN 1 SENSITIVE Sensitive     TRIMETH/SULFA <=10 SENSITIVE Sensitive     RIFAMPIN <=0.5 SENSITIVE Sensitive     Inducible Clindamycin NEGATIVE Sensitive     LINEZOLID 2 SENSITIVE  Sensitive     *  40,000 COLONIES/mL METHICILLIN RESISTANT STAPHYLOCOCCUS AUREUS  Culture, blood (Routine X 2) w Reflex to ID Panel     Status: None   Collection Time: 09/19/23  6:31 PM   Specimen: BLOOD  Result Value Ref Range Status   Specimen Description   Final    BLOOD LEFT ANTECUBITAL Performed at [facility], [address] [place], [place] [address]    Special Requests   Final    BOTTLES DRAWN AEROBIC AND ANAEROBIC Blood Culture adequate volume Performed at [facility], [address], [place], [place] [address]    Culture   Final    NO GROWTH 5 DAYS Performed at [facility], [address], [place], [place] [address]    Report Status 09/24/2023 FINAL  Final  Culture, blood (Routine X 2) w Reflex to ID Panel     Status: None   Collection Time: 09/19/23  8:24 PM   Specimen: BLOOD  Result Value Ref Range Status   Specimen Description   Final    BLOOD BLOOD LEFT HAND Performed at [facility], [address] [place], [place] [address]    Special Requests   Final    BOTTLES DRAWN AEROBIC AND ANAEROBIC Blood Culture results may not be optimal due to an inadequate volume of blood received in culture bottles Performed at [facility], [address], [place], [place] [address]    Culture   Final    NO GROWTH 5 DAYS Performed at [facility], [address] [place], [place] [address]    Report Status 09/24/2023 FINAL  Final     Time coordinating discharge: Over 30 minutes  SIGNED:   Ollen Bowl, MD  [facility] 09/24/2023, 1:41 PM Pager   If 7PM-7AM, please contact night-coverage [email or web address]

## 2023-09-24 NOTE — Plan of Care (Signed)
  Problem: Education: Goal: Knowledge of General Education information will improve Description: Including pain rating scale, medication(s)/side effects and non-pharmacologic comfort measures Outcome: Progressing   Problem: Health Behavior/Discharge Planning: Goal: Ability to manage health-related needs will improve Outcome: Progressing   Problem: Clinical Measurements: Goal: Will remain free from infection Outcome: Progressing Goal: Diagnostic test results will improve Outcome: Progressing Goal: Cardiovascular complication will be avoided Outcome: Progressing   Problem: Activity: Goal: Risk for activity intolerance will decrease Outcome: Progressing   Problem: Elimination: Goal: Will not experience complications related to bowel motility Outcome: Progressing   Problem: Safety: Goal: Ability to remain free from injury will improve Outcome: Progressing

## 2023-09-24 NOTE — Progress Notes (Signed)
Patient discharged via [facility] to [facility].  All belongings placed in belongings bag and transported with patient.

## 2023-09-24 NOTE — Progress Notes (Signed)
Report called to [facility]. She was informed of Mr. Drone foley coming with him for Urinary bladder obstruction and he is suppose to follow up with Urology at [facility] after discharge. Christophe transfers well with stand by assist and a walker. He sat up in the chair for lunch today.Corrie Dandy also informed of Toben having multiply falls at home. His daughter Maralyn Sago was called and informed that her Dad was being discharged today to [facility] and being transferred there by [facility].

## 2023-09-24 NOTE — TOC Transition Note (Signed)
Transition of Care [facility]) - Discharge Note   Patient Details  Name: Jon Bush MRN: [id number] Date of Birth: May 05, 1946  Transition of Care [facility]) CM/SW Contact:  Epifanio Lesches, RN Phone Number: 09/24/2023, 2:03 PM   Clinical Narrative:    Patient will DC to: home Anticipated DC date: 09/24/2023 Family notified: yes, daughter Transport by: car   Per MD patient ready for DC today . RN, patient, patient's  daughter  and facility ( [facility]) notified of DC. Discharge Summary and FL2 sent to facility. RN to call report prior to discharge (Mary/ [phone]). DC packet on chart. Ambulance transport requested for patient.   RNCM will sign off for now as intervention is no longer needed. Please consult [place] again if new needs arise.    Final next level of care: Skilled Nursing Facility Barriers to Discharge: No Barriers Identified   Patient Goals and CMS Choice Patient states their goals for this hospitalization and ongoing recovery are:: Wants to get better to be able to go home CMS Medicare.gov Compare Post Acute Care list provided to:: Patient Represenative (must comment) (daughter) Choice offered to / list presented to : Adult Children [place] ownership interest in [facility].provided to:: Adult Children    Discharge Placement                       Discharge Plan and Services Additional resources added to the After Visit Summary for   In-house Referral: NA Discharge Planning Services: CM Consult Post Acute Care Choice: Skilled Nursing Facility          DME Arranged: N/A DME Agency: NA       HH Arranged: NA HH Agency: NA        Social Drivers of Health (SDOH) Interventions SDOH Screenings   Food Insecurity: No Food Insecurity (09/20/2023)  Housing: Low Risk  (09/21/2023)  Transportation Needs: No Transportation Needs (09/20/2023)  Utilities: Not At Risk (09/20/2023)  Social Connections: Patient Declined (09/20/2023)  Tobacco  Use: Low Risk  (09/21/2023)     Readmission Risk Interventions     No data to display

## 2023-09-24 NOTE — Discharge Instructions (Signed)
Transported with patient to facility

## 2023-09-24 NOTE — Progress Notes (Signed)
Patient transferred from bed to chair with one assist. Sitting up for lunch.

## 2023-09-25 LAB — SURGICAL PATHOLOGY

## 2024-02-13 ENCOUNTER — Encounter (HOSPITAL_BASED_OUTPATIENT_CLINIC_OR_DEPARTMENT_OTHER): Payer: Self-pay | Admitting: Emergency Medicine

## 2024-02-13 ENCOUNTER — Other Ambulatory Visit: Payer: Self-pay

## 2024-02-13 ENCOUNTER — Emergency Department (HOSPITAL_BASED_OUTPATIENT_CLINIC_OR_DEPARTMENT_OTHER)
Admission: EM | Admit: 2024-02-13 | Discharge: 2024-02-13 | Disposition: A | Discharge status: Home / Self Care | Attending: Emergency Medicine | Admitting: Emergency Medicine

## 2024-02-13 ENCOUNTER — Emergency Department (HOSPITAL_BASED_OUTPATIENT_CLINIC_OR_DEPARTMENT_OTHER)

## 2024-02-13 DIAGNOSIS — N179 Acute kidney failure, unspecified: Secondary | ICD-10-CM | POA: Insufficient documentation

## 2024-02-13 DIAGNOSIS — R944 Abnormal results of kidney function studies: Secondary | ICD-10-CM | POA: Diagnosis present

## 2024-02-13 LAB — BASIC METABOLIC PANEL WITH GFR
Anion gap: 12 (ref 5–15)
BUN: 51 mg/dL — ABNORMAL HIGH (ref 8–23)
CO2: 23 mmol/L (ref 22–32)
Calcium: 9.7 mg/dL (ref 8.9–10.3)
Chloride: 102 mmol/L (ref 98–111)
Creatinine, Ser: 2.85 mg/dL — ABNORMAL HIGH (ref 0.61–1.24)
GFR, Estimated: 22 mL/min — ABNORMAL LOW (ref >60)
Glucose, Bld: 110 mg/dL — ABNORMAL HIGH (ref 70–99)
Potassium: 4.2 mmol/L (ref 3.5–5.1)
Sodium: 137 mmol/L (ref 135–145)

## 2024-02-13 LAB — URINALYSIS, ROUTINE W REFLEX MICROSCOPIC
Bilirubin Urine: NEGATIVE
Glucose, UA: NEGATIVE mg/dL
Hgb urine dipstick: NEGATIVE
Ketones, ur: NEGATIVE mg/dL
Nitrite: NEGATIVE
Protein, ur: 30 mg/dL — AB
Specific Gravity, Urine: 1.015 (ref 1.005–1.030)
pH: 7 (ref 5.0–8.0)

## 2024-02-13 LAB — CBC WITH DIFFERENTIAL/PLATELET
Abs Immature Granulocytes: 0.02 10*3/uL (ref 0.00–0.07)
Basophils Absolute: 0 10*3/uL (ref 0.0–0.1)
Basophils Relative: 1 %
Eosinophils Absolute: 0.4 10*3/uL (ref 0.0–0.5)
Eosinophils Relative: 7 %
HCT: 29 % — ABNORMAL LOW (ref 39.0–52.0)
Hemoglobin: 10 g/dL — ABNORMAL LOW (ref 13.0–17.0)
Immature Granulocytes: 0 %
Lymphocytes Relative: 31 %
Lymphs Abs: 1.9 10*3/uL (ref 0.7–4.0)
MCH: 32.8 pg (ref 26.0–34.0)
MCHC: 34.5 g/dL (ref 30.0–36.0)
MCV: 95.1 fL (ref 80.0–100.0)
Monocytes Absolute: 0.7 10*3/uL (ref 0.1–1.0)
Monocytes Relative: 11 %
Neutro Abs: 3.2 10*3/uL (ref 1.7–7.7)
Neutrophils Relative %: 50 %
Platelets: 280 10*3/uL (ref 150–400)
RBC: 3.05 MIL/uL — ABNORMAL LOW (ref 4.22–5.81)
RDW: 12 % (ref 11.5–15.5)
WBC: 6.2 10*3/uL (ref 4.0–10.5)
nRBC: 0 % (ref 0.0–0.2)

## 2024-02-13 LAB — URINALYSIS, MICROSCOPIC (REFLEX): RBC / HPF: NONE SEEN RBC/hpf (ref 0–5)

## 2024-02-13 MED ORDER — SODIUM CHLORIDE 0.9 % IV BOLUS
1000.0000 mL | Freq: Once | INTRAVENOUS | Status: AC
  Administered 2024-02-13: 1000 mL via INTRAVENOUS

## 2024-02-13 NOTE — ED Provider Notes (Signed)
 [facility] [facility] Provider Note   CSN: [id number] Arrival date & time: 02/13/24  1716     Patient presents with: Abnormal Lab   Jon Bush is a 78 y.o. male.   Patient here for elevated kidney function.  He is asymptomatic.  Is not having any difficulty urinating.  Denies any abdominal pain chest pain shortness of breath weakness numbness tingling.  Denies any headache or recent illness.  No nausea vomiting diarrhea.  The history is provided by the patient.       Prior to Admission medications   Medication Sig Start Date End Date Taking? Authorizing Provider  acetaminophen  (TYLENOL ) 500 MG tablet Take 2 tablets (1,000 mg total) by mouth every 8 (eight) hours as needed for mild pain. 03/05/20   Maczis, Michael M, PA-C  bisacodyl  (DULCOLAX) 5 MG EC tablet Take 1 tablet (5 mg total) by mouth daily. 09/25/23   Pahwani, Rinka R, MD  calcium carbonate (OS-CAL) 1250 (500 Ca) MG chewable tablet Chew 1 tablet by mouth daily. 03/22/18   [provider]  cholecalciferol (VITAMIN D3) 25 MCG (1000 UNIT) tablet Take 2,000 Units by mouth daily.    [provider]  Cyanocobalamin 1000 MCG TBCR Take 1,000 mcg by mouth daily. 07/05/21   [provider]  diclofenac  sodium (VOLTAREN ) 1 % GEL Apply 1 application topically 3 (three) times daily as needed (pain).     [provider]  escitalopram  (LEXAPRO ) 10 MG tablet Take 1 tablet by mouth daily. 09/14/22   [provider]  famotidine  (PEPCID ) 40 MG tablet Take 40 mg by mouth daily.    [provider]  fenofibrate  160 MG tablet Take 160 mg by mouth at bedtime.    [provider]  ferrous sulfate  325 (65 FE) MG tablet Take 325 mg by mouth daily with breakfast.    [provider]  folic acid  (FOLVITE ) 1 MG tablet Take 1 mg by mouth daily.    [provider]  gabapentin  (NEURONTIN ) 100 MG capsule Take 100 mg by mouth at bedtime.     [provider]  lactulose  (CHRONULAC ) 10 GM/15ML solution Take 45 mLs (30 g total) by mouth 2 (two) times daily as needed for mild constipation or moderate constipation. 09/24/23   Pahwani, Velna SAUNDERS, MD  magnesium  gluconate (MAGONATE) 500 MG tablet Take 500 mg by mouth daily.    [provider]  Melatonin 10 MG TABS Take 10 mg by mouth at bedtime.    [provider]  metoprolol  succinate (TOPROL -XL) 25 MG 24 hr tablet Take 25 mg by mouth daily.    [provider]  Multiple Vitamins-Minerals (MULTIVITAMIN MEN PO) Take 1 tablet by mouth daily.    [provider]  pantoprazole  (PROTONIX ) 40 MG tablet Take 1 tablet (40 mg total) by mouth daily. 09/25/23   Pahwani, Rinka R, MD  polyethylene glycol (MIRALAX  / GLYCOLAX ) 17 g packet Take 17 g by mouth 2 (two) times daily. 09/24/23   Pahwani, Velna SAUNDERS, MD  potassium citrate  (UROCIT-K ) 10 MEQ (1080 MG) SR tablet Take 20 mEq by mouth 2 (two) times daily.    [provider]  pravastatin  (PRAVACHOL ) 40 MG tablet Take 40 mg by mouth at bedtime.    [provider]  senna-docusate (SENOKOT-S) 8.6-50 MG tablet Take 1 tablet by mouth 2 (two) times daily. 09/24/23   Pahwani, Velna SAUNDERS, MD  simethicone  (MYLICON) 80 MG chewable tablet Chew 1 tablet (80 mg total)  by mouth every 6 (six) hours as needed for flatulence. 09/24/23   Pahwani, Velna SAUNDERS, MD    Allergies: Benzonatate and Codeine    Review of Systems  Updated Vital Signs BP 134/76   Pulse (!) 52   Temp 98 F (36.7 C)   Resp 17   Wt 83.9 kg   SpO2 99%   BMI 27.32 kg/m   Physical Exam Vitals and nursing note reviewed.  Constitutional:      General: He is not in acute distress.    Appearance: He is well-developed.  HENT:     Head: Normocephalic and atraumatic.     Nose: Nose normal.     Mouth/Throat:     Mouth: Mucous membranes are moist.   Eyes:     Extraocular Movements: Extraocular movements intact.     Conjunctiva/sclera: Conjunctivae  normal.     Pupils: Pupils are equal, round, and reactive to light.    Cardiovascular:     Rate and Rhythm: Normal rate and regular rhythm.     Pulses: Normal pulses.     Heart sounds: Normal heart sounds. No murmur heard. Pulmonary:     Effort: Pulmonary effort is normal. No respiratory distress.     Breath sounds: Normal breath sounds.  Abdominal:     Palpations: Abdomen is soft.     Tenderness: There is no abdominal tenderness.   Musculoskeletal:        General: No swelling.     Cervical back: Neck supple.   Skin:    General: Skin is warm and dry.     Capillary Refill: Capillary refill takes less than 2 seconds.   Neurological:     Mental Status: He is alert.   Psychiatric:        Mood and Affect: Mood normal.     (all labs ordered are listed, but only abnormal results are displayed) Labs Reviewed  URINALYSIS, ROUTINE W REFLEX MICROSCOPIC - Abnormal; Notable for the following components:      Result Value   Protein, ur 30 (*)    Leukocytes,Ua SMALL (*)    All other components within normal limits  URINALYSIS, MICROSCOPIC (REFLEX) - Abnormal; Notable for the following components:   Bacteria, UA RARE (*)    All other components within normal limits  BASIC METABOLIC PANEL WITH GFR - Abnormal; Notable for the following components:   Glucose, Bld 110 (*)    BUN 51 (*)    Creatinine, Ser 2.85 (*)    GFR, Estimated 22 (*)    All other components within normal limits  CBC WITH DIFFERENTIAL/PLATELET - Abnormal; Notable for the following components:   RBC 3.05 (*)    Hemoglobin 10.0 (*)    HCT 29.0 (*)    All other components within normal limits  CBC WITH DIFFERENTIAL/PLATELET    EKG: None  Radiology: CT Renal Stone Study Result Date: 02/13/2024 CLINICAL DATA:  Abdominal pain and flank pain. EXAM: CT ABDOMEN AND PELVIS WITHOUT CONTRAST TECHNIQUE: Multidetector CT imaging of the abdomen and pelvis was performed following the standard protocol without IV contrast.  RADIATION DOSE REDUCTION: This exam was performed according to the departmental dose-optimization program which includes automated exposure control, adjustment of the mA and/or kV according to patient size and/or use of iterative reconstruction technique. COMPARISON:  CT abdomen and pelvis 12/13/2023 FINDINGS: Lower chest: No acute abnormality. Hepatobiliary: No focal liver abnormality is seen. No gallstones, gallbladder wall thickening, or biliary dilatation. Pancreas: Unremarkable. No pancreatic ductal dilatation or  surrounding inflammatory changes. Spleen: Normal in size without focal abnormality. Adrenals/Urinary Tract: Bilateral renal cysts measure up to 3.5 cm. There are punctate bilateral renal calculi. There is no hydronephrosis. Adrenal glands and bladder are within. Stomach/Bowel: Stomach is within normal limits. Appendix appears normal. No evidence of bowel wall thickening, distention, or inflammatory changes. There is sigmoid colon diverticulosis. Vascular/Lymphatic: Aortic atherosclerosis. No enlarged abdominal or pelvic lymph nodes. Reproductive: Prostate is unremarkable. Other: There is a small fat containing umbilical hernia fat containing inguinal hernia. There is no ascites. Musculoskeletal: Degenerative changes affect the spine. IMPRESSION: 1. No acute localizing process in the abdomen or pelvis. 2. Nonobstructing bilateral renal calculi. 3. Bilateral renal cysts. No follow-up imaging recommended. 4. Sigmoid colon diverticulosis. 5. Aortic atherosclerosis. Aortic Atherosclerosis (ICD10-I70.0). Electronically Signed   By: Greig Pique M.D.   On: 02/13/2024 19:09     Procedures   Medications Ordered in the ED  sodium chloride  0.9 % bolus 1,000 mL (0 mLs Intravenous Stopped 02/13/24 2158)                                    Medical Decision Making Amount and/or Complexity of Data Reviewed Labs: ordered. Radiology: ordered.   Jon Bush is sent here by primary care doctor  office for worsening of his kidney function.  Baseline is usually around 1.4-1.6.  Has been around 3.4 here over the last few weeks.  He denies any symptoms.  No nausea vomiting diarrhea.  CT scan was done that showed no obstructive process.  He is not having any issues with urinary retention.  He follows with [facility].  Overall we will give him IV fluids and will talk to nephrology.  I do not have any strong indication that he is dehydrated.  He is pretty asymptomatic.  This could just be a progression of his chronic kidney disease.  But his creatinine has improved from 3.4 to 3.  I talked with Dr. Melia with nephrology.  Thinks it is reasonable to give him a fluid bolus and have him follow-up closely outpatient with nephrologist.  Overall given that he is asymptomatic reassuring vitals and CT scan.  Can continue to trend creatinine outpatient with the nephrology team.  Awaiting for repeat BMP here prior to discharge.  Urinalysis is unremarkable.  Ultimately hemoglobin is 10.  He is not had any dark or bloody stools.  Hemoglobin was around 12.5 recently.  Recommend that he return if he developed any melena or hematochezia.  His creatinine is down to 2.8.  Overall Dr. Melia recommends follow-up with nephrology outpatient.  He is asymptomatic.  Creatinine is improving.  Discharge.  Understands return precautions.  This chart was dictated using voice recognition software.  Despite best efforts to proofread,  errors can occur which can change the documentation meaning.      Final diagnoses:  AKI (acute kidney injury) [facility])    ED Discharge Orders     None          Ruthe Cornet, DO 02/13/24 2217

## 2024-02-13 NOTE — Discharge Instructions (Signed)
 Continue close follow-up with your nephrologist as we discussed.  Have them recheck your kidney function and your blood counts.  Return if symptoms worsen.  Return if you develop any dark or bloody stools.

## 2024-02-13 NOTE — ED Triage Notes (Signed)
 Routine  lab at PCP , abnormal kidney functions , denies pain .
